# Patient Record
Sex: Female | Born: 1955 | Race: White | Hispanic: No | Marital: Married | Smoking: Former smoker
Health system: Southern US, Community
[De-identification: ages and names within clinical notes are randomized; demographics above are authoritative.]

## PROBLEM LIST (undated history)

## (undated) DIAGNOSIS — E785 Hyperlipidemia, unspecified: Secondary | ICD-10-CM

## (undated) DIAGNOSIS — T753XXA Motion sickness, initial encounter: Secondary | ICD-10-CM

## (undated) DIAGNOSIS — Z8489 Family history of other specified conditions: Secondary | ICD-10-CM

## (undated) DIAGNOSIS — Z8619 Personal history of other infectious and parasitic diseases: Secondary | ICD-10-CM

## (undated) DIAGNOSIS — Z9189 Other specified personal risk factors, not elsewhere classified: Secondary | ICD-10-CM

## (undated) DIAGNOSIS — R42 Dizziness and giddiness: Secondary | ICD-10-CM

## (undated) DIAGNOSIS — I219 Acute myocardial infarction, unspecified: Secondary | ICD-10-CM

## (undated) DIAGNOSIS — I10 Essential (primary) hypertension: Secondary | ICD-10-CM

## (undated) HISTORY — PX: COLONOSCOPY: SHX174

## (undated) HISTORY — DX: Hyperlipidemia, unspecified: E78.5

## (undated) HISTORY — DX: Other specified personal risk factors, not elsewhere classified: Z91.89

## (undated) HISTORY — DX: Personal history of other infectious and parasitic diseases: Z86.19

---

## 2005-06-02 ENCOUNTER — Ambulatory Visit: Payer: Self-pay | Admitting: Internal Medicine

## 2005-06-14 ENCOUNTER — Ambulatory Visit: Payer: Self-pay | Admitting: Gastroenterology

## 2006-06-23 ENCOUNTER — Ambulatory Visit: Payer: Self-pay | Admitting: Internal Medicine

## 2007-07-12 ENCOUNTER — Ambulatory Visit: Payer: Self-pay | Admitting: Internal Medicine

## 2008-08-27 ENCOUNTER — Ambulatory Visit: Payer: Self-pay | Admitting: Internal Medicine

## 2009-09-02 ENCOUNTER — Ambulatory Visit: Payer: Self-pay | Admitting: Internal Medicine

## 2011-05-20 ENCOUNTER — Ambulatory Visit: Payer: Self-pay | Admitting: Internal Medicine

## 2013-10-19 ENCOUNTER — Ambulatory Visit: Payer: Self-pay | Admitting: Internal Medicine

## 2013-11-14 ENCOUNTER — Encounter (INDEPENDENT_AMBULATORY_CARE_PROVIDER_SITE_OTHER): Payer: Self-pay

## 2013-11-14 ENCOUNTER — Encounter: Payer: Self-pay | Admitting: Internal Medicine

## 2013-11-14 ENCOUNTER — Ambulatory Visit (INDEPENDENT_AMBULATORY_CARE_PROVIDER_SITE_OTHER): Payer: PRIVATE HEALTH INSURANCE | Admitting: Internal Medicine

## 2013-11-14 VITALS — BP 130/80 | HR 72 | Temp 98.1°F | Ht 61.0 in | Wt 157.2 lb

## 2013-11-14 DIAGNOSIS — K625 Hemorrhage of anus and rectum: Secondary | ICD-10-CM

## 2013-11-14 DIAGNOSIS — E7800 Pure hypercholesterolemia, unspecified: Secondary | ICD-10-CM

## 2013-11-14 DIAGNOSIS — R030 Elevated blood-pressure reading, without diagnosis of hypertension: Secondary | ICD-10-CM

## 2013-11-14 DIAGNOSIS — IMO0001 Reserved for inherently not codable concepts without codable children: Secondary | ICD-10-CM

## 2013-11-14 DIAGNOSIS — Z8371 Family history of colonic polyps: Secondary | ICD-10-CM

## 2013-11-14 NOTE — Progress Notes (Signed)
Pre-visit discussion using our clinic review tool. No additional management support is needed unless otherwise documented below in the visit note.  

## 2013-11-17 ENCOUNTER — Encounter: Payer: Self-pay | Admitting: Internal Medicine

## 2013-11-17 NOTE — Progress Notes (Signed)
   Subjective:    Patient ID: Leah Espinoza, female    DOB: 21-Jan-1956, 58 y.o.   MRN: [id number]  HPI 58 year old female with past history of elevated blood pressure and hypercholesterolemia who comes in today to follow up on these issues as well as to establish care.  She was a pt of mine at [facility].  She states she has been doing relatively well.  No chest pain or tightness.  No sob.  Minimal acid reflux.  She does report noticing some change in her stool.  Stool is not as firm.  States that 3- 4 weeks ago, she noticed some BRB on the tissue.  Only noticed on one occasion.  She is overdue a colonoscopy.  States blood pressure - ok.     Past Medical History  Diagnosis Date  . History of chicken pox   . History of fainting   . Hyperlipidemia     Review of Systems Patient denies any headache, lightheadedness or dizziness.  No sinus or allergy symptoms.  No chest pain, tightness or palpitations.  No increased shortness of breath, cough or congestion.  Minimal acid reflux.  No nausea or vomiting.  No abdominal pain or cramping.  Stool not as firm.  BRB as outlined.  No urine change.  Overall feels she is doing well.       Objective:   Physical Exam Filed Vitals:   11/14/13 1044  BP: 130/80  Pulse: 72  Temp: 98.1 F (26.74 C)   58 year old female in no acute distress.   HEENT:  Nares- clear.  Oropharynx - without lesions. NECK:  Supple.  Nontender.  No audible bruit.  HEART:  Appears to be regular. LUNGS:  No crackles or wheezing audible.  Respirations even and unlabored.  RADIAL PULSE:  Equal bilaterally. ABDOMEN:  Soft, nontender.  Bowel sounds present and normal.  No audible abdominal bruit.   EXTREMITIES:  No increased edema present.  DP pulses palpable and equal bilaterally.          Assessment & Plan:  HEALTH MAINTENANCE.  Schedule her for a physical next visit.  Schedule mammogram.  Refer to GI for colonoscopy.    I spent 25 minutes with the patient and more than 50% of the  time was spent in consultation regarding the above.

## 2013-11-18 DIAGNOSIS — E7800 Pure hypercholesterolemia, unspecified: Secondary | ICD-10-CM | POA: Insufficient documentation

## 2013-11-18 DIAGNOSIS — Z8371 Family history of colonic polyps: Secondary | ICD-10-CM | POA: Insufficient documentation

## 2013-11-18 DIAGNOSIS — R030 Elevated blood-pressure reading, without diagnosis of hypertension: Secondary | ICD-10-CM | POA: Insufficient documentation

## 2013-11-18 DIAGNOSIS — K625 Hemorrhage of anus and rectum: Secondary | ICD-10-CM | POA: Insufficient documentation

## 2013-11-18 NOTE — Assessment & Plan Note (Signed)
Blood pressure ok today.  Follow.  Check metabolic panel.

## 2013-11-18 NOTE — Assessment & Plan Note (Signed)
Overdue colonoscopy.  With the BRB and stool change, refer to GI.

## 2013-11-18 NOTE — Assessment & Plan Note (Signed)
Had the episode of BRB as outlined.  Stool change as outlined.  Refer to GI for evaluation and is overdue colonoscopy.

## 2013-11-18 NOTE — Assessment & Plan Note (Signed)
On no medication.  Low cholesterol diet.  Will follow.

## 2013-11-30 ENCOUNTER — Encounter: Payer: Self-pay | Admitting: Internal Medicine

## 2013-11-30 ENCOUNTER — Telehealth: Payer: Self-pay | Admitting: Internal Medicine

## 2013-11-30 DIAGNOSIS — Z1239 Encounter for other screening for malignant neoplasm of breast: Secondary | ICD-10-CM

## 2013-11-30 NOTE — Telephone Encounter (Signed)
Does she need one faxed over or just for me to place the order.  Thanks.

## 2013-11-30 NOTE — Telephone Encounter (Signed)
The patient is needing a mammogram order.

## 2013-12-03 NOTE — Telephone Encounter (Signed)
Order placed for mammogram.

## 2013-12-03 NOTE — Telephone Encounter (Signed)
Unable to reach patient on home number, can not leave a message on voicemail. You can place the referral order & if patients needs something different, I'll let you know if she calls back.

## 2013-12-19 ENCOUNTER — Encounter: Payer: Self-pay | Admitting: Internal Medicine

## 2014-01-02 ENCOUNTER — Ambulatory Visit: Payer: Self-pay | Admitting: Gastroenterology

## 2014-01-02 LAB — HM COLONOSCOPY: HM Colonoscopy: NORMAL

## 2014-01-11 ENCOUNTER — Encounter: Payer: Self-pay | Admitting: Internal Medicine

## 2014-01-11 DIAGNOSIS — K625 Hemorrhage of anus and rectum: Secondary | ICD-10-CM

## 2014-01-15 ENCOUNTER — Ambulatory Visit: Payer: Self-pay | Admitting: Internal Medicine

## 2014-01-15 ENCOUNTER — Encounter: Payer: Self-pay | Admitting: Internal Medicine

## 2014-01-15 LAB — HM MAMMOGRAPHY: HM Mammogram: NEGATIVE

## 2014-02-01 ENCOUNTER — Encounter (INDEPENDENT_AMBULATORY_CARE_PROVIDER_SITE_OTHER): Payer: Self-pay

## 2014-02-01 ENCOUNTER — Encounter: Payer: Self-pay | Admitting: Internal Medicine

## 2014-02-01 ENCOUNTER — Other Ambulatory Visit (HOSPITAL_COMMUNITY)
Admission: RE | Admit: 2014-02-01 | Discharge: 2014-02-01 | Disposition: A | Discharge status: Home / Self Care | Payer: Managed Care, Other (non HMO) | Source: Ambulatory Visit | Attending: Internal Medicine | Admitting: Internal Medicine

## 2014-02-01 ENCOUNTER — Ambulatory Visit (INDEPENDENT_AMBULATORY_CARE_PROVIDER_SITE_OTHER): Payer: Managed Care, Other (non HMO) | Admitting: Internal Medicine

## 2014-02-01 VITALS — BP 130/80 | HR 59 | Temp 98.3°F | Ht 60.5 in | Wt 157.0 lb

## 2014-02-01 DIAGNOSIS — Z83719 Family history of colon polyps, unspecified: Secondary | ICD-10-CM

## 2014-02-01 DIAGNOSIS — Z124 Encounter for screening for malignant neoplasm of cervix: Secondary | ICD-10-CM

## 2014-02-01 DIAGNOSIS — Z8371 Family history of colonic polyps: Secondary | ICD-10-CM

## 2014-02-01 DIAGNOSIS — Z1151 Encounter for screening for human papillomavirus (HPV): Secondary | ICD-10-CM | POA: Insufficient documentation

## 2014-02-01 DIAGNOSIS — E7800 Pure hypercholesterolemia, unspecified: Secondary | ICD-10-CM

## 2014-02-01 DIAGNOSIS — R030 Elevated blood-pressure reading, without diagnosis of hypertension: Secondary | ICD-10-CM

## 2014-02-01 DIAGNOSIS — IMO0001 Reserved for inherently not codable concepts without codable children: Secondary | ICD-10-CM

## 2014-02-01 DIAGNOSIS — K625 Hemorrhage of anus and rectum: Secondary | ICD-10-CM

## 2014-02-01 MED ORDER — PRAVASTATIN SODIUM 20 MG PO TABS: ORAL_TABLET | ORAL | Status: DC

## 2014-02-01 NOTE — Progress Notes (Signed)
Pre visit review using our clinic review tool, if applicable. No additional management support is needed unless otherwise documented below in the visit note. 

## 2014-02-04 ENCOUNTER — Encounter: Payer: Self-pay | Admitting: Internal Medicine

## 2014-02-04 NOTE — Assessment & Plan Note (Signed)
On no medication.  Low cholesterol diet.  Discussed at length with her regarding her cholesterol levels and treatment options.  She wants to retry pravastatin.  Will start 20mg  q day.  Diet and exercise.  Will follow.

## 2014-02-04 NOTE — Assessment & Plan Note (Signed)
Colonoscopy 4/15 - normal.

## 2014-02-04 NOTE — Assessment & Plan Note (Signed)
Colonoscopy normal as outlined.  No further bleeding.  Follow.

## 2014-02-04 NOTE — Assessment & Plan Note (Signed)
Blood pressure ok today.  Follow.  Check metabolic panel.

## 2014-02-04 NOTE — Progress Notes (Signed)
   Subjective:    Patient ID: Leah Espinoza, female    DOB: February 18, 1956, 58 y.o.   MRN: [id number]  HPI 58 year old female with past history of elevated blood pressure and hypercholesterolemia who comes in today to follow up on these issues as well as for a complete physical exam.  She states she has been doing relatively well.  No chest pain or tightness.  No sob.  Stays active.  No acid reflux reported.  No nausea or vomiting.  No bowel change.  Previously had noticed some BRB.  See last note for details.  Referred to GI.  Had colonoscopy.  Clear.  Recommended f/u in 10 years.     Past Medical History  Diagnosis Date  . History of chicken pox   . History of fainting   . Hyperlipidemia     Review of Systems Patient denies any headache, lightheadedness or dizziness.  No sinus or allergy symptoms.  No chest pain, tightness or palpitations.  No increased shortness of breath, cough or congestion.  No acid reflux reported.  No nausea or vomiting.  No abdominal pain or cramping.  No urine change.  Overall feels she is doing well.  Recent labs with increased cholesterol.  Discussed diet and exercise.       Objective:   Physical Exam  Filed Vitals:   02/01/14 0948  BP: 130/80  Pulse: 59  Temp: 98.3 F (36.8 C)   Blood pressure recheck:  9/33-30  58 year old female in no acute distress.   HEENT:  Nares- clear.  Oropharynx - without lesions. NECK:  Supple.  Nontender.  No audible bruit.  HEART:  Appears to be regular. LUNGS:  No crackles or wheezing audible.  Respirations even and unlabored.  RADIAL PULSE:  Equal bilaterally.    BREASTS:  No nipple discharge or nipple retraction present.  Could not appreciate any distinct nodules or axillary adenopathy.  ABDOMEN:  Soft, nontender.  Bowel sounds present and normal.  No audible abdominal bruit.  GU:  Normal external genitalia.  Vaginal vault without lesions.  Cervix identified.  Pap performed. Could not appreciate any adnexal masses or  tenderness.   RECTAL:  Not performed.     EXTREMITIES:  No increased edema present.  DP pulses palpable and equal bilaterally.          Assessment & Plan:  HEALTH MAINTENANCE.  Physical today.  Mammogram 01/15/14 - Birads I.   Just saw GI.  Had colonoscopy.  Negative.  Per pt, recommended f/u colonoscopy in 10 years.      I spent 25 minutes with the patient and more than 50% of the time was spent in consultation regarding the above.

## 2014-02-08 ENCOUNTER — Encounter: Payer: Self-pay | Admitting: *Deleted

## 2014-02-12 ENCOUNTER — Encounter: Payer: PRIVATE HEALTH INSURANCE | Admitting: Internal Medicine

## 2014-02-13 ENCOUNTER — Encounter: Payer: Self-pay | Admitting: Internal Medicine

## 2014-08-05 ENCOUNTER — Encounter: Payer: Self-pay | Admitting: Internal Medicine

## 2014-08-05 ENCOUNTER — Ambulatory Visit (INDEPENDENT_AMBULATORY_CARE_PROVIDER_SITE_OTHER): Payer: Managed Care, Other (non HMO) | Admitting: Internal Medicine

## 2014-08-05 VITALS — BP 164/95 | HR 73 | Temp 98.4°F | Ht 60.5 in | Wt 158.0 lb

## 2014-08-05 DIAGNOSIS — Z8371 Family history of colonic polyps: Secondary | ICD-10-CM

## 2014-08-05 DIAGNOSIS — IMO0001 Reserved for inherently not codable concepts without codable children: Secondary | ICD-10-CM

## 2014-08-05 DIAGNOSIS — Z658 Other specified problems related to psychosocial circumstances: Secondary | ICD-10-CM

## 2014-08-05 DIAGNOSIS — E7800 Pure hypercholesterolemia, unspecified: Secondary | ICD-10-CM

## 2014-08-05 DIAGNOSIS — F439 Reaction to severe stress, unspecified: Secondary | ICD-10-CM | POA: Insufficient documentation

## 2014-08-05 DIAGNOSIS — R030 Elevated blood-pressure reading, without diagnosis of hypertension: Secondary | ICD-10-CM

## 2014-08-05 DIAGNOSIS — E669 Obesity, unspecified: Secondary | ICD-10-CM

## 2014-08-05 DIAGNOSIS — Z83719 Family history of colon polyps, unspecified: Secondary | ICD-10-CM

## 2014-08-05 NOTE — Progress Notes (Signed)
Subjective:    Patient ID: Leah Espinoza, female    DOB: [birth year], 57 y.o.   MRN: [id number]  HPI 58 year old female with past history of elevated blood pressure and hypercholesterolemia who comes in today for a scheduled follow up.  She reports increased stress.  Sister just had a heart attack last night.  She was up late.  Feels this is why her blood pressure is elevated.  Crying intermittently through the exam. She states she has been doing relatively well otherwise.  States normally her blood pressure is averaging - 409-811 systolic.  No chest pain or tightness.  No sob.  Tries to stay active.  Some fatigue.  No acid reflux reported.  No nausea or vomiting.  No bowel change.  Previously had noticed some BRB.  See previous note for details.  Referred to GI.  Had colonoscopy.  Clear.  Recommended f/u in 10 years.  No further bleeding.  Bowels stable.  Not taking her cholesterol medication regularly.    Past Medical History  Diagnosis Date  . History of chicken pox   . History of fainting   . Hyperlipidemia     Current Outpatient Prescriptions on File Prior to Visit  Medication Sig Dispense Refill  . pravastatin (PRAVACHOL) 20 MG tablet Pt wants delivered to [facility] 30 tablet 3   No current facility-administered medications on file prior to visit.    Review of Systems Patient denies any headache, lightheadedness or dizziness.  No sinus or allergy symptoms.  No chest pain, tightness or palpitations.  No increased shortness of breath, cough or congestion.  No acid reflux reported.  No nausea or vomiting.  No abdominal pain or cramping.  No urine change.  Overall feels she is doing well.  Increased stress as outlined.  Elevated blood pressure today.  Has not been taking her cholesterol medication regularly.  Did not get her labs drawn.       Objective:   Physical Exam  Filed Vitals:   08/05/14 0805  BP: 164/95  Pulse: 73  Temp: 98.4 F (36.9 C)   Blood pressure  recheck:  152-154/92-94, recheck prior to leaving 84/50-88  59 year old female in no acute distress.   HEENT:  Nares- clear.  Oropharynx - without lesions. NECK:  Supple.  Nontender.  No audible bruit.  HEART:  Appears to be regular. LUNGS:  No crackles or wheezing audible.  Respirations even and unlabored.  RADIAL PULSE:  Equal bilaterally.  ABDOMEN:  Soft, nontender.  Bowel sounds present and normal.  No audible abdominal bruit.     EXTREMITIES:  No increased edema present.  DP pulses palpable and equal bilaterally.          Assessment & Plan:  1. Hypercholesterolemia Cholesterol elevated.  She has been resistant to taking cholesterol medication.  She has pravastatin.  Not taking regularly.  Is now planning to start taking.  Follow lipid and liver panel.    2. Family history of colonic polyps Colonoscopy 01/02/14 - normal.  Recommended f/u colonoscopy in 10 years.    3. Elevated blood pressure Elevated today.  Sister had heart attack last night.  Upset.  States usually not this elevated.  Will spot check her pressure.  Get her back in soon to reassess.  Check met b.    4. Stress Increased stress with her sister's health as outlined.  Feels she is doing ok.  Will notify me if needs something more.  5. Obesity (BMI 30-39.9) Discussed diet and exercise.   HEALTH MAINTENANCE.  Physical 02/04/14.  Mammogram 01/15/14 - Birads I.   Colonoscopy 01/02/14 - normal.  Per pt, recommended f/u colonoscopy in 10 years.      I spent 25 minutes with the patient and more than 50% of the time was spent in consultation regarding the above.

## 2014-08-05 NOTE — Progress Notes (Signed)
Pre visit review using our clinic review tool, if applicable. No additional management support is needed unless otherwise documented below in the visit note. 

## 2014-10-15 ENCOUNTER — Ambulatory Visit: Payer: Managed Care, Other (non HMO) | Admitting: Internal Medicine

## 2014-11-14 ENCOUNTER — Telehealth: Payer: Self-pay | Admitting: Internal Medicine

## 2014-11-14 NOTE — Telephone Encounter (Signed)
[facility] Patient Name: Eylin [facility] DOB: 1956/04/06 Initial Comment Caller States she has been having cough, congestion, very weak. for 4 days she could not get off the couch. not sure if she is running a temp at the moment. Nurse Assessment Nurse: Markus Daft, RN, Sherre Poot Date/Time (Eastern Time): 11/14/2014 9:59:41 AM Confirm and document reason for call. If symptomatic, describe symptoms. ---Caller states she has been having productive cough, chest congestion, very weak for 4 days. She could not get off the couch d/t fatigue and weakness. No fever. Has the patient traveled out of the country within the last 30 days? ---Not Applicable Does the patient require triage? ---Yes Related visit to physician within the last 2 weeks? ---No Does the PT have any chronic conditions? (i.e. diabetes, asthma, etc.) ---Yes List chronic conditions. ---High cholesterol Guidelines Guideline Title Affirmed Question Affirmed Notes Cough - Acute Productive Fever present > 3 days (72 hours) chest pressure with coughing only, rates 4/10 Final Disposition User See Physician within [place], [place], [place] Comments No available appts (checked [place] and this office), and please check with doctor if she can be worked in today. Please call within 1-2 hours if able to do this, otherwise, caller will go on to be seen at [facility].

## 2014-11-14 NOTE — Telephone Encounter (Signed)
Since I am already working someone in through lunch and have a meeting right after work, I am unable to work in today.   If no openings here, agree with acute care (especially given the weakness, etc).  Can do labs if needed.  Would recommend [facility] or [facility] [facility].

## 2014-11-14 NOTE — Telephone Encounter (Signed)
Spoke with pt advised of MDs message.  She states she will go to [facility].

## 2014-11-27 LAB — TSH: TSH: 1.07 u[IU]/mL (ref 0.41–5.90)

## 2014-11-27 LAB — LIPID PANEL
CHOLESTEROL: 244 mg/dL — AB (ref 0–200)
HDL: 47 mg/dL (ref 35–70)
LDL CALC: 172 mg/dL
Triglycerides: 127 mg/dL (ref 40–160)

## 2014-11-27 LAB — HEPATIC FUNCTION PANEL
ALK PHOS: 82 U/L (ref 25–125)
ALT: 9 U/L (ref 7–35)
AST: 12 U/L — AB (ref 13–35)
BILIRUBIN, TOTAL: 0.3 mg/dL
Bilirubin, Direct: 0.09 mg/dL (ref 0.01–0.4)

## 2014-11-27 LAB — CBC AND DIFFERENTIAL
HCT: 37 % (ref 36–46)
Hemoglobin: 12.7 g/dL (ref 12.0–16.0)
NEUTROS ABS: 4 /uL
Platelets: 355 10*3/uL (ref 150–399)
WBC: 6.8 10^3/mL

## 2014-11-27 LAB — BASIC METABOLIC PANEL
BUN: 9 mg/dL (ref 4–21)
Creatinine: 0.8 mg/dL (ref 0.5–1.1)
GLUCOSE: 107 mg/dL
Potassium: 4.4 mmol/L (ref 3.4–5.3)
Sodium: 143 mmol/L (ref 137–147)

## 2014-11-28 ENCOUNTER — Encounter: Payer: Self-pay | Admitting: *Deleted

## 2014-12-04 ENCOUNTER — Encounter: Payer: Self-pay | Admitting: Internal Medicine

## 2014-12-04 ENCOUNTER — Ambulatory Visit (INDEPENDENT_AMBULATORY_CARE_PROVIDER_SITE_OTHER): Payer: Managed Care, Other (non HMO) | Admitting: Internal Medicine

## 2014-12-04 VITALS — BP 137/85 | HR 75 | Temp 98.3°F | Ht 60.5 in | Wt 152.0 lb

## 2014-12-04 DIAGNOSIS — R030 Elevated blood-pressure reading, without diagnosis of hypertension: Secondary | ICD-10-CM | POA: Diagnosis not present

## 2014-12-04 DIAGNOSIS — E7800 Pure hypercholesterolemia, unspecified: Secondary | ICD-10-CM

## 2014-12-04 DIAGNOSIS — Z0000 Encounter for general adult medical examination without abnormal findings: Secondary | ICD-10-CM

## 2014-12-04 DIAGNOSIS — Z8371 Family history of colonic polyps: Secondary | ICD-10-CM | POA: Diagnosis not present

## 2014-12-04 DIAGNOSIS — IMO0001 Reserved for inherently not codable concepts without codable children: Secondary | ICD-10-CM

## 2014-12-04 MED ORDER — PRAVASTATIN SODIUM 20 MG PO TABS: ORAL_TABLET | ORAL | Status: DC

## 2014-12-04 NOTE — Progress Notes (Signed)
Pre visit review using our clinic review tool, if applicable. No additional management support is needed unless otherwise documented below in the visit note. 

## 2014-12-09 ENCOUNTER — Encounter: Payer: Self-pay | Admitting: Internal Medicine

## 2014-12-09 DIAGNOSIS — Z0000 Encounter for general adult medical examination without abnormal findings: Secondary | ICD-10-CM | POA: Insufficient documentation

## 2014-12-09 NOTE — Assessment & Plan Note (Signed)
Had colonoscopy 01/02/14.  Recommended f/u colonoscopy in 10 years.

## 2014-12-09 NOTE — Progress Notes (Signed)
Patient ID: Leah Espinoza, female   DOB: Feb 08, 1956, 59 y.o.   MRN: [id number]   Subjective:    Patient ID: Leah Espinoza, female    DOB: 1956-05-31, 59 y.o.   MRN: [id number]  HPI  Patient here for a scheduled follow up.  Has been recording her blood pressures.   Most recent checks averaging 130s/80s.  Taking her pravastatin, but not taking it on a regular basis.  No chest pain or tightness.  No cardiac symptoms with increased activity or exertion.  Breathing stable.  Bowels stable.     Past Medical History  Diagnosis Date  . History of chicken pox   . History of fainting   . Hyperlipidemia     Outpatient Encounter Prescriptions as of 12/04/2014  Medication Sig  . pravastatin (PRAVACHOL) 20 MG tablet Pt wants delivered to [facility]  . [DISCONTINUED] pravastatin (PRAVACHOL) 20 MG tablet Pt wants delivered to [facility]    Review of Systems  Constitutional: Negative for appetite change and unexpected weight change.  HENT: Negative for congestion and sinus pressure.   Respiratory: Negative for cough, chest tightness and shortness of breath.   Cardiovascular: Negative for chest pain, palpitations and leg swelling.  Gastrointestinal: Negative for nausea, vomiting, abdominal pain and diarrhea.  Neurological: Negative for dizziness, light-headedness and headaches.       Objective:    Physical Exam  Constitutional: She appears well-developed and well-nourished. No distress.  HENT:  Nose: Nose normal.  Mouth/Throat: Oropharynx is clear and moist.  Neck: Neck supple. No thyromegaly present.  Cardiovascular: Normal rate and regular rhythm.   Pulmonary/Chest: Breath sounds normal. No respiratory distress. She has no wheezes.  Abdominal: Soft. Bowel sounds are normal. There is no tenderness.  Musculoskeletal: She exhibits no edema or tenderness.  Lymphadenopathy:    She has no cervical adenopathy.  Skin: No rash noted. No erythema.    BP 137/85 mmHg  Pulse 75   Temp(Src) 98.3 F (36.8 C) (Oral)  Ht 5' 0.5" (1.537 m)  Wt 152 lb (68.947 kg)  BMI 29.19 kg/m2  SpO2 97% Wt Readings from Last 3 Encounters:  12/04/14 152 lb (68.947 kg)  08/05/14 158 lb (71.668 kg)  02/01/14 157 lb (71.215 kg)     Lab Results  Component Value Date   WBC 6.8 11/27/2014   HGB 12.7 11/27/2014   HCT 37 11/27/2014   PLT 355 11/27/2014   CHOL 244* 11/27/2014   TRIG 127 11/27/2014   HDL 47 11/27/2014   LDLCALC 172 11/27/2014   ALT 9 11/27/2014   AST 12* 11/27/2014   NA 143 11/27/2014   K 4.4 11/27/2014   CREATININE 0.8 11/27/2014   BUN 9 11/27/2014   TSH 1.07 11/27/2014       Assessment & Plan:   Problem List Items Addressed This Visit    Elevated blood pressure - Primary    Blood pressures on her outside checks recently averaging 130s/80s.  She will continue to follow her pressures.. Notify me if elevation.  Follow metabolic panel.       Family history of colonic polyps    Had colonoscopy 01/02/14.  Recommended f/u colonoscopy in 10 years.        Health care maintenance    Physical 02/04/14.  Mammogram 01/15/14 - Birads I.  Colonoscopy 01/02/14 - recommend f/u colonoscopy in 10 years.        Hypercholesterolemia    On pravastatin 20mg  q day.  States she has not  been taking it regularly.  LDL just checked had decreased 172. She wants to hold on increasing the dose.  Wants to work on diet and exercise.  Follow lipid panel and liver function tests.        Relevant Medications   pravastatin (PRAVACHOL) tablet       Einar Pheasant, MD

## 2014-12-09 NOTE — Assessment & Plan Note (Signed)
On pravastatin 20mg  q day.  States she has not been taking it regularly.  LDL just checked had decreased 172. She wants to hold on increasing the dose.  Wants to work on diet and exercise.  Follow lipid panel and liver function tests.

## 2014-12-09 NOTE — Assessment & Plan Note (Signed)
Physical 02/04/14.  Mammogram 01/15/14 - Birads I.  Colonoscopy 01/02/14 - recommend f/u colonoscopy in 10 years.

## 2014-12-09 NOTE — Assessment & Plan Note (Signed)
Blood pressures on her outside checks recently averaging 130s/80s.  She will continue to follow her pressures.. Notify me if elevation.  Follow metabolic panel.

## 2015-04-02 ENCOUNTER — Other Ambulatory Visit: Payer: Self-pay | Admitting: Internal Medicine

## 2015-04-02 DIAGNOSIS — Z1231 Encounter for screening mammogram for malignant neoplasm of breast: Secondary | ICD-10-CM

## 2015-04-03 ENCOUNTER — Ambulatory Visit
Admission: RE | Admit: 2015-04-03 | Discharge: 2015-04-03 | Disposition: A | Discharge status: Home / Self Care | Payer: Managed Care, Other (non HMO) | Source: Ambulatory Visit | Attending: Internal Medicine | Admitting: Internal Medicine

## 2015-04-03 DIAGNOSIS — Z1231 Encounter for screening mammogram for malignant neoplasm of breast: Secondary | ICD-10-CM | POA: Insufficient documentation

## 2015-04-04 ENCOUNTER — Encounter: Payer: Self-pay | Admitting: Internal Medicine

## 2015-04-04 LAB — HEPATIC FUNCTION PANEL
ALK PHOS: 91 U/L (ref 25–125)
ALT: 15 U/L (ref 7–35)
AST: 16 U/L (ref 13–35)
BILIRUBIN DIRECT: 0.09 mg/dL (ref 0.01–0.4)
BILIRUBIN, TOTAL: 0.3 mg/dL

## 2015-04-04 LAB — BASIC METABOLIC PANEL
BUN: 12 mg/dL (ref 4–21)
Creatinine: 1 mg/dL (ref 0.5–1.1)
Glucose: 97 mg/dL
Potassium: 4.3 mmol/L (ref 3.4–5.3)
SODIUM: 144 mmol/L (ref 137–147)

## 2015-04-04 LAB — LIPID PANEL
Cholesterol: 288 mg/dL — AB (ref 0–200)
HDL: 60 mg/dL (ref 35–70)
LDL CALC: 208 mg/dL
TRIGLYCERIDES: 101 mg/dL (ref 40–160)

## 2015-04-08 ENCOUNTER — Ambulatory Visit (INDEPENDENT_AMBULATORY_CARE_PROVIDER_SITE_OTHER): Payer: Managed Care, Other (non HMO) | Admitting: Internal Medicine

## 2015-04-08 ENCOUNTER — Encounter: Payer: Self-pay | Admitting: Internal Medicine

## 2015-04-08 VITALS — BP 134/86 | HR 64 | Temp 98.7°F | Ht 60.5 in | Wt 152.0 lb

## 2015-04-08 DIAGNOSIS — Z8371 Family history of colonic polyps: Secondary | ICD-10-CM

## 2015-04-08 DIAGNOSIS — Z0000 Encounter for general adult medical examination without abnormal findings: Secondary | ICD-10-CM

## 2015-04-08 DIAGNOSIS — E7800 Pure hypercholesterolemia, unspecified: Secondary | ICD-10-CM

## 2015-04-08 DIAGNOSIS — IMO0001 Reserved for inherently not codable concepts without codable children: Secondary | ICD-10-CM

## 2015-04-08 DIAGNOSIS — E669 Obesity, unspecified: Secondary | ICD-10-CM

## 2015-04-08 DIAGNOSIS — R030 Elevated blood-pressure reading, without diagnosis of hypertension: Secondary | ICD-10-CM

## 2015-04-08 DIAGNOSIS — Z658 Other specified problems related to psychosocial circumstances: Secondary | ICD-10-CM

## 2015-04-08 DIAGNOSIS — F439 Reaction to severe stress, unspecified: Secondary | ICD-10-CM

## 2015-04-08 NOTE — Progress Notes (Signed)
Pre visit review using our clinic review tool, if applicable. No additional management support is needed unless otherwise documented below in the visit note. 

## 2015-04-08 NOTE — Assessment & Plan Note (Signed)
Physical today 04/08/15.  Mammogram 04/03/15 - Birads I.  Colonoscopy 01/02/14.  Recommended f/u colonoscopy in 10 years.

## 2015-04-08 NOTE — Assessment & Plan Note (Signed)
On recheck improved - as outlined.  Follow pressures.  Have her spot check her pressure.  Recent Cr wnl.

## 2015-04-08 NOTE — Assessment & Plan Note (Signed)
Recent cholesterol elevated.  Discussed diet and exercise.  She is not taking her medication regularly.  Discussed changing to crestor - given level.  She declines.  Wants to continue 20mg  of pravastatin and take regularly.  Recheck cholesterol and liver function tests in 6 weeks.

## 2015-04-08 NOTE — Assessment & Plan Note (Signed)
Diet and exercise.   

## 2015-04-08 NOTE — Progress Notes (Signed)
Patient ID: Leah Espinoza, female   DOB: 1956/07/24, 59 y.o.   MRN: [id number]   Subjective:    Patient ID: Leah Espinoza, female    DOB: 06/03/1956, 59 y.o.   MRN: [id number]  HPI  Patient here to follow up on these medical issues as well as for a complete physical exam.  Occasional IBS flare.  She desires no further intervention.  Does not feel she needs anything more.  Just had colonoscopy.  Tries to stay active.  No cardiac symptoms with increased activity or exertion.  No sob.  Not taking her cholesterol medication regularly.  Skips a lot of doses.  LDL elevated - 208.     Past Medical History  Diagnosis Date  . History of chicken pox   . History of fainting   . Hyperlipidemia     Outpatient Encounter Prescriptions as of 04/08/2015  Medication Sig  . pravastatin (PRAVACHOL) 20 MG tablet Pt wants delivered to [facility]   No facility-administered encounter medications on file as of 04/08/2015.    Review of Systems  Constitutional: Negative for appetite change and unexpected weight change.  HENT: Negative for congestion and sinus pressure.   Eyes: Negative for pain and visual disturbance.  Respiratory: Negative for cough, chest tightness and shortness of breath.   Cardiovascular: Negative for chest pain, palpitations and leg swelling.  Gastrointestinal: Negative for nausea, vomiting, abdominal pain and diarrhea.  Genitourinary: Negative for dysuria and difficulty urinating.  Musculoskeletal: Negative for back pain and joint swelling.  Skin: Negative for color change and rash.  Neurological: Negative for dizziness, light-headedness and headaches.  Hematological: Negative for adenopathy. Does not bruise/bleed easily.  Psychiatric/Behavioral: Negative for dysphoric mood and agitation.       Objective:     Blood pressure recheck:  134-136/84-86  Physical Exam  Constitutional: She is oriented to person, place, and time. She appears well-developed and well-nourished.    HENT:  Nose: Nose normal.  Mouth/Throat: Oropharynx is clear and moist.  Eyes: Right eye exhibits no discharge. Left eye exhibits no discharge. No scleral icterus.  Neck: Neck supple. No thyromegaly present.  Cardiovascular: Normal rate and regular rhythm.   Pulmonary/Chest: Breath sounds normal. No accessory muscle usage. No tachypnea. No respiratory distress. She has no decreased breath sounds. She has no wheezes. She has no rhonchi. Right breast exhibits no inverted nipple, no mass, no nipple discharge and no tenderness (no axillary adenopathy). Left breast exhibits no inverted nipple, no mass, no nipple discharge and no tenderness (no axilarry adenopathy).  Abdominal: Soft. Bowel sounds are normal. There is no tenderness.  Musculoskeletal: She exhibits no edema or tenderness.  Lymphadenopathy:    She has no cervical adenopathy.  Neurological: She is alert and oriented to person, place, and time.  Skin: Skin is warm. No rash noted.  Psychiatric: She has a normal mood and affect. Her behavior is normal.    BP 134/86 mmHg  Pulse 64  Temp(Src) 98.7 F (37.1 C) (Oral)  Ht 5' 0.5" (1.537 m)  Wt 152 lb (68.947 kg)  BMI 29.19 kg/m2  SpO2 95% Wt Readings from Last 3 Encounters:  04/08/15 152 lb (68.947 kg)  12/04/14 152 lb (68.947 kg)  08/05/14 158 lb (71.668 kg)     Lab Results  Component Value Date   WBC 6.8 11/27/2014   HGB 12.7 11/27/2014   HCT 37 11/27/2014   PLT 355 11/27/2014   CHOL 288* 04/04/2015   TRIG 101 04/04/2015   HDL  60 04/04/2015   LDLCALC 208 04/04/2015   ALT 15 04/04/2015   AST 16 04/04/2015   NA 144 04/04/2015   K 4.3 04/04/2015   CREATININE 1.0 04/04/2015   BUN 12 04/04/2015   TSH 1.07 11/27/2014    Mm Digital Screening Bilateral  04/03/2015   CLINICAL DATA:  Screening.  EXAM: DIGITAL SCREENING BILATERAL MAMMOGRAM WITH CAD  COMPARISON:  Previous exam(s).  ACR Breast Density Category b: There are scattered areas of fibroglandular density.  FINDINGS:  There are no findings suspicious for malignancy. Images were processed with CAD.  IMPRESSION: No mammographic evidence of malignancy. A result letter of this screening mammogram will be mailed directly to the patient.  RECOMMENDATION: Screening mammogram in one year. (Code:SM-B-01Y)  BI-RADS CATEGORY  1: Negative.   Electronically Signed   By: Abelardo Diesel M.D.   On: 04/03/2015 14:26       Assessment & Plan:   Problem List Items Addressed This Visit    Elevated blood pressure    On recheck improved - as outlined.  Follow pressures.  Have her spot check her pressure.  Recent Cr wnl.       Family history of colonic polyps    Colonoscopy 01/02/14.  Recommended f/u in 10 years.       Health care maintenance - Primary    Physical today 04/08/15.  Mammogram 04/03/15 - Birads I.  Colonoscopy 01/02/14.  Recommended f/u colonoscopy in 10 years.        Hypercholesterolemia    Recent cholesterol elevated.  Discussed diet and exercise.  She is not taking her medication regularly.  Discussed changing to crestor - given level.  She declines.  Wants to continue 20mg  of pravastatin and take regularly.  Recheck cholesterol and liver function tests in 6 weeks.        Obesity (BMI 30-39.9)    Diet and exercise.        Stress    Doing well.            Einar Pheasant, MD

## 2015-04-08 NOTE — Assessment & Plan Note (Signed)
Colonoscopy 01/02/14.  Recommended f/u in 10 years.

## 2015-04-08 NOTE — Assessment & Plan Note (Signed)
Doing well 

## 2015-08-12 ENCOUNTER — Ambulatory Visit (INDEPENDENT_AMBULATORY_CARE_PROVIDER_SITE_OTHER): Payer: Managed Care, Other (non HMO) | Admitting: Internal Medicine

## 2015-08-12 ENCOUNTER — Encounter: Payer: Self-pay | Admitting: Internal Medicine

## 2015-08-12 VITALS — BP 120/80 | HR 65 | Temp 98.3°F | Resp 18 | Ht 60.5 in | Wt 155.0 lb

## 2015-08-12 DIAGNOSIS — Z8371 Family history of colonic polyps: Secondary | ICD-10-CM

## 2015-08-12 DIAGNOSIS — E669 Obesity, unspecified: Secondary | ICD-10-CM | POA: Diagnosis not present

## 2015-08-12 DIAGNOSIS — Z658 Other specified problems related to psychosocial circumstances: Secondary | ICD-10-CM | POA: Diagnosis not present

## 2015-08-12 DIAGNOSIS — R030 Elevated blood-pressure reading, without diagnosis of hypertension: Secondary | ICD-10-CM

## 2015-08-12 DIAGNOSIS — E7800 Pure hypercholesterolemia, unspecified: Secondary | ICD-10-CM

## 2015-08-12 DIAGNOSIS — Z83719 Family history of colon polyps, unspecified: Secondary | ICD-10-CM

## 2015-08-12 DIAGNOSIS — F439 Reaction to severe stress, unspecified: Secondary | ICD-10-CM

## 2015-08-12 DIAGNOSIS — IMO0001 Reserved for inherently not codable concepts without codable children: Secondary | ICD-10-CM

## 2015-08-12 MED ORDER — PRAVASTATIN SODIUM 20 MG PO TABS: ORAL_TABLET | ORAL | Status: DC

## 2015-08-12 NOTE — Progress Notes (Signed)
Pre-visit discussion using our clinic review tool. No additional management support is needed unless otherwise documented below in the visit note.  

## 2015-08-12 NOTE — Progress Notes (Signed)
Patient ID: Leah Espinoza, female   DOB: 16-May-1956, 59 y.o.   MRN: [id number]   Subjective:    Patient ID: Leah Espinoza, female    DOB: 21-Nov-1955, 59 y.o.   MRN: [id number]  HPI  Patient with past history of hypercholesterolemia who comes in today to follow up on this issue.  She is doing well.  Not exercising.  Not watching her diet.  We discussed this today.  Discussed diet and exercise.  No cardiac symptoms with increased activity or exertion.  Breathing stable.  Bowels stable.  Not taking her cholesterol medication regularly.  Cholesterol levels reviewed.  Slighlty improved.  Handling stress.    Past Medical History  Diagnosis Date  . History of chicken pox   . History of fainting   . Hyperlipidemia    No past surgical history on file. Family History  Problem Relation Age of Onset  . Hyperlipidemia Mother   . Heart disease Mother   . Cancer Father     lung  . Sudden death Brother   . Arthritis Maternal Grandmother    Social History   Social History  . Marital Status: Married    Spouse Name: N/A  . Number of Children: N/A  . Years of Education: N/A   Social History Main Topics  . Smoking status: Former Research scientist (life sciences)  . Smokeless tobacco: Never Used  . Alcohol Use: 0.0 oz/week    0 Standard drinks or equivalent per week  . Drug Use: No  . Sexual Activity: Not Asked   Other Topics Concern  . None   Social History Narrative    Outpatient Encounter Prescriptions as of 08/12/2015  Medication Sig  . pravastatin (PRAVACHOL) 20 MG tablet Pt wants delivered to [facility]  . [DISCONTINUED] pravastatin (PRAVACHOL) 20 MG tablet Pt wants delivered to [facility]   No facility-administered encounter medications on file as of 08/12/2015.    Review of Systems  Constitutional: Negative for appetite change and unexpected weight change.  HENT: Negative for congestion and sinus pressure.   Eyes: Negative for pain and discharge.  Respiratory: Negative for cough,  chest tightness and shortness of breath.   Cardiovascular: Negative for chest pain, palpitations and leg swelling.  Gastrointestinal: Negative for nausea, vomiting, abdominal pain and diarrhea.  Genitourinary: Negative for dysuria and difficulty urinating.  Musculoskeletal: Negative for back pain and joint swelling.  Skin: Negative for color change and rash.  Neurological: Negative for dizziness, light-headedness and headaches.  Psychiatric/Behavioral: Negative for dysphoric mood and agitation.       Objective:     Blood pressure rechecked by me:  134/84  Physical Exam  Constitutional: She appears well-developed and well-nourished. No distress.  HENT:  Nose: Nose normal.  Mouth/Throat: Oropharynx is clear and moist.  Eyes: Conjunctivae are normal. Right eye exhibits no discharge. Left eye exhibits no discharge.  Neck: Neck supple. No thyromegaly present.  Cardiovascular: Normal rate and regular rhythm.   Pulmonary/Chest: Breath sounds normal. No respiratory distress. She has no wheezes.  Abdominal: Soft. Bowel sounds are normal. There is no tenderness.  Musculoskeletal: She exhibits no edema or tenderness.  Lymphadenopathy:    She has no cervical adenopathy.  Skin: No rash noted. No erythema.  Psychiatric: She has a normal mood and affect. Her behavior is normal.    BP 120/80 mmHg  Pulse 65  Temp(Src) 98.3 F (36.8 C) (Oral)  Resp 18  Ht 5' 0.5" (1.537 m)  Wt 155 lb (70.308 kg)  BMI 29.76 kg/m2  SpO2 97% Wt Readings from Last 3 Encounters:  08/12/15 155 lb (70.308 kg)  04/08/15 152 lb (68.947 kg)  12/04/14 152 lb (68.947 kg)     Lab Results  Component Value Date   WBC 6.8 11/27/2014   HGB 12.7 11/27/2014   HCT 37 11/27/2014   PLT 355 11/27/2014   CHOL 288* 04/04/2015   TRIG 101 04/04/2015   HDL 60 04/04/2015   LDLCALC 208 04/04/2015   ALT 15 04/04/2015   AST 16 04/04/2015   NA 144 04/04/2015   K 4.3 04/04/2015   CREATININE 1.0 04/04/2015   BUN 12  04/04/2015   TSH 1.07 11/27/2014    Mm Digital Screening Bilateral  04/03/2015  CLINICAL DATA:  Screening. EXAM: DIGITAL SCREENING BILATERAL MAMMOGRAM WITH CAD COMPARISON:  Previous exam(s). ACR Breast Density Category b: There are scattered areas of fibroglandular density. FINDINGS: There are no findings suspicious for malignancy. Images were processed with CAD. IMPRESSION: No mammographic evidence of malignancy. A result letter of this screening mammogram will be mailed directly to the patient. RECOMMENDATION: Screening mammogram in one year. (Code:SM-B-01Y) BI-RADS CATEGORY  1: Negative. Electronically Signed   By: Abelardo Diesel M.D.   On: 04/03/2015 14:26       Assessment & Plan:   Problem List Items Addressed This Visit    Elevated blood pressure    Blood pressure as outlined.  Doing better.  Follow.        Family history of colonic polyps    Colonoscopy 01/02/14.  Recommended f/u colonoscopy in 10 years.        Hypercholesterolemia    On pravastatin.  Low cholesterol diet and exercise.  Needs to take regularly.  Not taking regularly.  Follow lipid panel and liver function tests.        Relevant Medications   pravastatin (PRAVACHOL) 20 MG tablet   Obesity (BMI 30-39.9)    Diet and exercise.        Stress - Primary    Handling stress.  Follow.            Einar Pheasant, MD

## 2015-08-17 ENCOUNTER — Encounter: Payer: Self-pay | Admitting: Internal Medicine

## 2015-08-17 NOTE — Assessment & Plan Note (Signed)
Diet and exercise.   

## 2015-08-17 NOTE — Assessment & Plan Note (Signed)
Colonoscopy 01/02/14.  Recommended f/u colonoscopy in 10 years.

## 2015-08-17 NOTE — Assessment & Plan Note (Signed)
Blood pressure as outlined.  Doing better.  Follow.

## 2015-08-17 NOTE — Assessment & Plan Note (Signed)
Handling stress.  Follow.   

## 2015-08-17 NOTE — Assessment & Plan Note (Signed)
On pravastatin.  Low cholesterol diet and exercise.  Needs to take regularly.  Not taking regularly.  Follow lipid panel and liver function tests.

## 2015-11-18 ENCOUNTER — Other Ambulatory Visit: Payer: Self-pay | Admitting: Internal Medicine

## 2015-11-19 LAB — TSH: TSH: 1.98 u[IU]/mL (ref 0.450–4.500)

## 2015-11-24 ENCOUNTER — Encounter: Payer: Self-pay | Admitting: *Deleted

## 2015-12-09 ENCOUNTER — Other Ambulatory Visit: Payer: Self-pay | Admitting: Internal Medicine

## 2015-12-10 LAB — LIPID PANEL W/O CHOL/HDL RATIO
Cholesterol, Total: 284 mg/dL — ABNORMAL HIGH (ref 100–199)
HDL: 54 mg/dL (ref >39)
LDL Calculated: 212 mg/dL — ABNORMAL HIGH (ref 0–99)
TRIGLYCERIDES: 89 mg/dL (ref 0–149)
VLDL CHOLESTEROL CAL: 18 mg/dL (ref 5–40)

## 2015-12-10 LAB — BASIC METABOLIC PANEL
BUN/Creatinine Ratio: 14 (ref 11–26)
BUN: 14 mg/dL (ref 8–27)
CALCIUM: 9.1 mg/dL (ref 8.7–10.3)
CHLORIDE: 101 mmol/L (ref 96–106)
CO2: 25 mmol/L (ref 18–29)
Creatinine, Ser: 1.02 mg/dL — ABNORMAL HIGH (ref 0.57–1.00)
GFR calc non Af Amer: 60 mL/min/{1.73_m2} (ref >59)
GFR, EST AFRICAN AMERICAN: 69 mL/min/{1.73_m2} (ref >59)
GLUCOSE: 103 mg/dL — AB (ref 65–99)
POTASSIUM: 4.3 mmol/L (ref 3.5–5.2)
Sodium: 141 mmol/L (ref 134–144)

## 2015-12-10 LAB — CBC WITH DIFFERENTIAL/PLATELET
BASOS ABS: 0 10*3/uL (ref 0.0–0.2)
Basos: 1 %
EOS (ABSOLUTE): 0.1 10*3/uL (ref 0.0–0.4)
Eos: 2 %
Hematocrit: 40 % (ref 34.0–46.6)
Hemoglobin: 13.3 g/dL (ref 11.1–15.9)
IMMATURE GRANS (ABS): 0 10*3/uL (ref 0.0–0.1)
IMMATURE GRANULOCYTES: 0 %
Lymphocytes Absolute: 2.3 10*3/uL (ref 0.7–3.1)
Lymphs: 49 %
MCH: 31.1 pg (ref 26.6–33.0)
MCHC: 33.3 g/dL (ref 31.5–35.7)
MCV: 94 fL (ref 79–97)
MONOS ABS: 0.4 10*3/uL (ref 0.1–0.9)
Monocytes: 8 %
NEUTROS PCT: 40 %
Neutrophils Absolute: 1.9 10*3/uL (ref 1.4–7.0)
Platelets: 280 10*3/uL (ref 150–379)
RBC: 4.28 x10E6/uL (ref 3.77–5.28)
RDW: 14.5 % (ref 12.3–15.4)
WBC: 4.7 10*3/uL (ref 3.4–10.8)

## 2015-12-10 LAB — HEPATIC FUNCTION PANEL
ALT: 14 IU/L (ref 0–32)
AST: 16 IU/L (ref 0–40)
Albumin: 4.2 g/dL (ref 3.6–4.8)
Alkaline Phosphatase: 87 IU/L (ref 39–117)
BILIRUBIN, DIRECT: 0.08 mg/dL (ref 0.00–0.40)
Bilirubin Total: 0.3 mg/dL (ref 0.0–1.2)
TOTAL PROTEIN: 7.2 g/dL (ref 6.0–8.5)

## 2015-12-10 LAB — TSH: TSH: 2.02 u[IU]/mL (ref 0.450–4.500)

## 2015-12-11 ENCOUNTER — Ambulatory Visit (INDEPENDENT_AMBULATORY_CARE_PROVIDER_SITE_OTHER): Payer: Managed Care, Other (non HMO) | Admitting: Internal Medicine

## 2015-12-11 ENCOUNTER — Encounter: Payer: Self-pay | Admitting: Internal Medicine

## 2015-12-11 VITALS — BP 120/80 | HR 52 | Temp 98.0°F | Resp 18 | Ht 60.5 in | Wt 157.2 lb

## 2015-12-11 DIAGNOSIS — F439 Reaction to severe stress, unspecified: Secondary | ICD-10-CM

## 2015-12-11 DIAGNOSIS — Z658 Other specified problems related to psychosocial circumstances: Secondary | ICD-10-CM | POA: Diagnosis not present

## 2015-12-11 DIAGNOSIS — R0789 Other chest pain: Secondary | ICD-10-CM

## 2015-12-11 DIAGNOSIS — Z8371 Family history of colonic polyps: Secondary | ICD-10-CM

## 2015-12-11 DIAGNOSIS — R030 Elevated blood-pressure reading, without diagnosis of hypertension: Secondary | ICD-10-CM

## 2015-12-11 DIAGNOSIS — Z83719 Family history of colon polyps, unspecified: Secondary | ICD-10-CM

## 2015-12-11 DIAGNOSIS — E7800 Pure hypercholesterolemia, unspecified: Secondary | ICD-10-CM | POA: Diagnosis not present

## 2015-12-11 DIAGNOSIS — E669 Obesity, unspecified: Secondary | ICD-10-CM | POA: Diagnosis not present

## 2015-12-11 DIAGNOSIS — IMO0001 Reserved for inherently not codable concepts without codable children: Secondary | ICD-10-CM

## 2015-12-11 NOTE — Progress Notes (Signed)
Patient ID: Leah Espinoza, female   DOB: 1956/06/25, 60 y.o.   MRN: [id number]   Subjective:    Patient ID: Leah Espinoza, female    DOB: 1956/07/17, 60 y.o.   MRN: [id number]  HPI  Patient here for a scheduled follow up.  She has noticed recently some chest tightness and tightness up to her jaw.  Notices when lying down.  Feels at times neck glands are swollen.  Not now.  She is trying to exercise.  Tries to stay active.  No chest pain with increased activity or exertion.  Breathing stable.  No nausea or vomiting.  Bowels stable.  Handling stress.    Past Medical History  Diagnosis Date  . History of chicken pox   . History of fainting   . Hyperlipidemia    No past surgical history on file. Family History  Problem Relation Age of Onset  . Hyperlipidemia Mother   . Heart disease Mother   . Cancer Father     lung  . Sudden death Brother   . Arthritis Maternal Grandmother    Social History   Social History  . Marital Status: Married    Spouse Name: N/A  . Number of Children: N/A  . Years of Education: N/A   Social History Main Topics  . Smoking status: Former Research scientist (life sciences)  . Smokeless tobacco: Never Used  . Alcohol Use: 0.0 oz/week    0 Standard drinks or equivalent per week  . Drug Use: No  . Sexual Activity: Not Asked   Other Topics Concern  . None   Social History Narrative    Outpatient Encounter Prescriptions as of 12/11/2015  Medication Sig  . pravastatin (PRAVACHOL) 20 MG tablet Pt wants delivered to [facility]   No facility-administered encounter medications on file as of 12/11/2015.    Review of Systems  Constitutional: Negative for appetite change and unexpected weight change.  HENT: Negative for congestion and sinus pressure.   Respiratory: Positive for chest tightness. Negative for cough and shortness of breath.   Cardiovascular: Negative for chest pain, palpitations and leg swelling.  Gastrointestinal: Negative for nausea, vomiting, abdominal  pain and diarrhea.  Genitourinary: Negative for dysuria and difficulty urinating.  Musculoskeletal: Negative for back pain and joint swelling.  Skin: Negative for color change and rash.  Neurological: Negative for dizziness, light-headedness and headaches.  Psychiatric/Behavioral: Negative for dysphoric mood and agitation.       Objective:    Physical Exam  Constitutional: She appears well-developed and well-nourished. No distress.  HENT:  Nose: Nose normal.  Mouth/Throat: Oropharynx is clear and moist.  Neck: Neck supple. No thyromegaly present.  Cardiovascular: Normal rate and regular rhythm.   Pulmonary/Chest: Breath sounds normal. No respiratory distress. She has no wheezes.  Abdominal: Soft. Bowel sounds are normal. There is no tenderness.  Musculoskeletal: She exhibits no edema or tenderness.  Lymphadenopathy:    She has no cervical adenopathy.  Skin: No rash noted. No erythema.  Psychiatric: She has a normal mood and affect. Her behavior is normal.    BP 120/80 mmHg  Pulse 52  Temp(Src) 98 F (36.7 C) (Oral)  Resp 18  Ht 5' 0.5" (1.537 m)  Wt 157 lb 4 oz (71.328 kg)  BMI 30.19 kg/m2  SpO2 97% Wt Readings from Last 3 Encounters:  12/11/15 157 lb 4 oz (71.328 kg)  08/12/15 155 lb (70.308 kg)  04/08/15 152 lb (68.947 kg)     Lab Results  Component Value Date  WBC 4.7 12/09/2015   HGB 12.7 11/27/2014   HCT 40.0 12/09/2015   PLT 280 12/09/2015   GLUCOSE 103* 12/09/2015   CHOL 284* 12/09/2015   TRIG 89 12/09/2015   HDL 54 12/09/2015   LDLCALC 212* 12/09/2015   ALT 14 12/09/2015   AST 16 12/09/2015   NA 141 12/09/2015   K 4.3 12/09/2015   CL 101 12/09/2015   CREATININE 1.02* 12/09/2015   BUN 14 12/09/2015   CO2 25 12/09/2015   TSH 2.020 12/09/2015    Mm Digital Screening Bilateral  04/03/2015  CLINICAL DATA:  Screening. EXAM: DIGITAL SCREENING BILATERAL MAMMOGRAM WITH CAD COMPARISON:  Previous exam(s). ACR Breast Density Category b: There are  scattered areas of fibroglandular density. FINDINGS: There are no findings suspicious for malignancy. Images were processed with CAD. IMPRESSION: No mammographic evidence of malignancy. A result letter of this screening mammogram will be mailed directly to the patient. RECOMMENDATION: Screening mammogram in one year. (Code:SM-B-01Y) BI-RADS CATEGORY  1: Negative. Electronically Signed   By: Abelardo Diesel M.D.   On: 04/03/2015 14:26       Assessment & Plan:   Problem List Items Addressed This Visit    Chest tightness - Primary   Relevant Orders   EKG 12-Lead (Completed)   Elevated blood pressure    Blood pressure on recheck a little elevated.  Have her spot check her pressure.  She declines blood pressure medication.  Follow.  Get her back in soon to reassess.        Family history of colonic polyps    Colonoscopy 01/02/14 - recommended f/u colonoscopy in 10 years.        Hypercholesterolemia    On pravastatin.  Not taking regularly.  Needs to take on a regular basis.  Follow lipid panel and liver function tests.        Obesity (BMI 30-39.9)    Discussed diet and exercise.  Follow.        Stress    Handling stress.  Follow.            Einar Pheasant, MD

## 2015-12-11 NOTE — Progress Notes (Signed)
Pre-visit discussion using our clinic review tool. No additional management support is needed unless otherwise documented below in the visit note.  

## 2015-12-14 DIAGNOSIS — R0789 Other chest pain: Secondary | ICD-10-CM | POA: Insufficient documentation

## 2015-12-14 NOTE — Assessment & Plan Note (Signed)
On pravastatin.  Not taking regularly.  Needs to take on a regular basis.  Follow lipid panel and liver function tests.

## 2015-12-14 NOTE — Assessment & Plan Note (Signed)
Colonoscopy 01/02/14 - recommended f/u colonoscopy in 10 years.

## 2015-12-14 NOTE — Assessment & Plan Note (Signed)
Discussed diet and exercise.  Follow.  

## 2015-12-14 NOTE — Assessment & Plan Note (Signed)
Blood pressure on recheck a little elevated.  Have her spot check her pressure.  She declines blood pressure medication.  Follow.  Get her back in soon to reassess.

## 2015-12-14 NOTE — Assessment & Plan Note (Signed)
EKG obtained and revealed SR with no acute ischemic changes.  Discussed further cardiac w/up given risk factors.  She declines.  Continue risk factor modification.  Follow.   Will notify me if she changes her mind.

## 2015-12-14 NOTE — Assessment & Plan Note (Signed)
Handling stress.  Follow.   

## 2015-12-15 ENCOUNTER — Other Ambulatory Visit: Payer: Self-pay | Admitting: *Deleted

## 2015-12-15 MED ORDER — PRAVASTATIN SODIUM 20 MG PO TABS: ORAL_TABLET | ORAL | Status: DC

## 2015-12-16 ENCOUNTER — Other Ambulatory Visit: Payer: Self-pay

## 2015-12-16 MED ORDER — PRAVASTATIN SODIUM 20 MG PO TABS: ORAL_TABLET | ORAL | Status: DC

## 2015-12-23 ENCOUNTER — Inpatient Hospital Stay
Admission: EM | Admit: 2015-12-23 | Discharge: 2015-12-25 | DRG: 247 | Disposition: A | Discharge status: Home / Self Care | Payer: Managed Care, Other (non HMO) | Attending: Cardiology | Admitting: Cardiology

## 2015-12-23 ENCOUNTER — Emergency Department: Payer: Managed Care, Other (non HMO)

## 2015-12-23 ENCOUNTER — Encounter
Admission: EM | Disposition: A | Discharge status: Home / Self Care | Payer: Self-pay | Source: Home / Self Care | Attending: Cardiology

## 2015-12-23 ENCOUNTER — Encounter: Payer: Self-pay | Admitting: Emergency Medicine

## 2015-12-23 DIAGNOSIS — E785 Hyperlipidemia, unspecified: Secondary | ICD-10-CM | POA: Diagnosis present

## 2015-12-23 DIAGNOSIS — Z8249 Family history of ischemic heart disease and other diseases of the circulatory system: Secondary | ICD-10-CM | POA: Diagnosis not present

## 2015-12-23 DIAGNOSIS — I10 Essential (primary) hypertension: Secondary | ICD-10-CM | POA: Diagnosis present

## 2015-12-23 DIAGNOSIS — Z87891 Personal history of nicotine dependence: Secondary | ICD-10-CM | POA: Diagnosis not present

## 2015-12-23 DIAGNOSIS — I2582 Chronic total occlusion of coronary artery: Secondary | ICD-10-CM | POA: Diagnosis present

## 2015-12-23 DIAGNOSIS — Z79899 Other long term (current) drug therapy: Secondary | ICD-10-CM | POA: Diagnosis not present

## 2015-12-23 DIAGNOSIS — I2510 Atherosclerotic heart disease of native coronary artery without angina pectoris: Secondary | ICD-10-CM | POA: Diagnosis present

## 2015-12-23 DIAGNOSIS — Z801 Family history of malignant neoplasm of trachea, bronchus and lung: Secondary | ICD-10-CM | POA: Diagnosis not present

## 2015-12-23 DIAGNOSIS — I2109 ST elevation (STEMI) myocardial infarction involving other coronary artery of anterior wall: Principal | ICD-10-CM | POA: Diagnosis present

## 2015-12-23 DIAGNOSIS — I2119 ST elevation (STEMI) myocardial infarction involving other coronary artery of inferior wall: Secondary | ICD-10-CM | POA: Diagnosis present

## 2015-12-23 DIAGNOSIS — I213 ST elevation (STEMI) myocardial infarction of unspecified site: Secondary | ICD-10-CM | POA: Diagnosis present

## 2015-12-23 HISTORY — DX: Essential (primary) hypertension: I10

## 2015-12-23 HISTORY — PX: CARDIAC CATHETERIZATION: SHX172

## 2015-12-23 LAB — PLATELET COUNT: Platelets: 219 10*3/uL (ref 150–440)

## 2015-12-23 LAB — PROTIME-INR: INR: >10

## 2015-12-23 LAB — COMPREHENSIVE METABOLIC PANEL
ALBUMIN: 3.3 g/dL — AB (ref 3.5–5.0)
ALT: 14 U/L (ref 14–54)
AST: 23 U/L (ref 15–41)
Alkaline Phosphatase: 67 U/L (ref 38–126)
Anion gap: 6 (ref 5–15)
BILIRUBIN TOTAL: 0.5 mg/dL (ref 0.3–1.2)
BUN: 19 mg/dL (ref 6–20)
CO2: 23 mmol/L (ref 22–32)
Calcium: 8 mg/dL — ABNORMAL LOW (ref 8.9–10.3)
Chloride: 106 mmol/L (ref 101–111)
Creatinine, Ser: 0.89 mg/dL (ref 0.44–1.00)
GFR calc Af Amer: >60 mL/min (ref >60)
GFR calc non Af Amer: >60 mL/min (ref >60)
GLUCOSE: 114 mg/dL — AB (ref 65–99)
POTASSIUM: 3.2 mmol/L — AB (ref 3.5–5.1)
Sodium: 135 mmol/L (ref 135–145)
Total Protein: 6.7 g/dL (ref 6.5–8.1)

## 2015-12-23 LAB — CBC
HEMATOCRIT: 36.1 % (ref 35.0–47.0)
Hemoglobin: 12.2 g/dL (ref 12.0–16.0)
MCH: 31.5 pg (ref 26.0–34.0)
MCHC: 33.7 g/dL (ref 32.0–36.0)
MCV: 93.4 fL (ref 80.0–100.0)
Platelets: 241 10*3/uL (ref 150–440)
RBC: 3.86 MIL/uL (ref 3.80–5.20)
RDW: 13.7 % (ref 11.5–14.5)
WBC: 9.2 10*3/uL (ref 3.6–11.0)

## 2015-12-23 LAB — TROPONIN I
Troponin I: <0.03 ng/mL (ref <0.031)
Troponin I: 24.5 ng/mL — ABNORMAL HIGH (ref <0.031)
Troponin I: >65 ng/mL — ABNORMAL HIGH (ref <0.031)

## 2015-12-23 LAB — MAGNESIUM: Magnesium: 1.9 mg/dL (ref 1.7–2.4)

## 2015-12-23 LAB — MRSA PCR SCREENING: MRSA BY PCR: NEGATIVE

## 2015-12-23 LAB — APTT

## 2015-12-23 SURGERY — LEFT HEART CATH AND CORONARY ANGIOGRAPHY
Anesthesia: Moderate Sedation

## 2015-12-23 MED ORDER — ASPIRIN 300 MG RE SUPP
300.0000 mg | RECTAL | Status: AC
Start: 2015-12-23 — End: 2015-12-24
  Filled 2015-12-23: qty 1

## 2015-12-23 MED ORDER — ASPIRIN EC 325 MG PO TBEC
325.0000 mg | DELAYED_RELEASE_TABLET | Freq: Every day | ORAL | Status: DC
Start: 2015-12-24 — End: 2015-12-23

## 2015-12-23 MED ORDER — FENTANYL CITRATE (PF) 100 MCG/2ML IJ SOLN
INTRAMUSCULAR | Status: DC | PRN
  Administered 2015-12-23: 25 ug via INTRAVENOUS

## 2015-12-23 MED ORDER — TICAGRELOR 90 MG PO TABS
90.0000 mg | ORAL_TABLET | Freq: Two times a day (BID) | ORAL | Status: DC
  Administered 2015-12-24 – 2015-12-25 (×3): 90 mg via ORAL
  Filled 2015-12-23 (×3): qty 1

## 2015-12-23 MED ORDER — METOPROLOL TARTRATE 25 MG PO TABS
12.5000 mg | ORAL_TABLET | Freq: Two times a day (BID) | ORAL | Status: DC
  Administered 2015-12-23 – 2015-12-24 (×2): 12.5 mg via ORAL
  Filled 2015-12-23 (×3): qty 1

## 2015-12-23 MED ORDER — HEPARIN (PORCINE) IN NACL 2-0.9 UNIT/ML-% IJ SOLN
INTRAMUSCULAR | Status: AC
  Filled 2015-12-23: qty 1000

## 2015-12-23 MED ORDER — SODIUM CHLORIDE 0.9 % IV SOLN: 250.0000 mL | INTRAVENOUS | Status: DC | PRN

## 2015-12-23 MED ORDER — TICAGRELOR 90 MG PO TABS
ORAL_TABLET | ORAL | Status: AC
  Filled 2015-12-23: qty 2

## 2015-12-23 MED ORDER — MIDAZOLAM HCL 2 MG/2ML IJ SOLN
INTRAMUSCULAR | Status: AC
Start: 2015-12-23 — End: 2015-12-23
  Filled 2015-12-23: qty 2

## 2015-12-23 MED ORDER — ASPIRIN 81 MG PO CHEW
324.0000 mg | CHEWABLE_TABLET | ORAL | Status: AC
Start: 2015-12-23 — End: 2015-12-24

## 2015-12-23 MED ORDER — SODIUM CHLORIDE 0.9% FLUSH
3.0000 mL | INTRAVENOUS | Status: DC | PRN
  Administered 2015-12-25: 6 mL via INTRAVENOUS
  Filled 2015-12-23: qty 3

## 2015-12-23 MED ORDER — SODIUM CHLORIDE 0.9 % IV SOLN
0.2500 mg/kg/h | INTRAVENOUS | Status: AC
  Filled 2015-12-23: qty 250

## 2015-12-23 MED ORDER — TIROFIBAN HCL IN NACL 5-0.9 MG/100ML-% IV SOLN
INTRAVENOUS | Status: DC | PRN
Start: 2015-12-23 — End: 2015-12-23
  Administered 2015-12-23: 0.15 ug/kg/min via INTRAVENOUS

## 2015-12-23 MED ORDER — SODIUM CHLORIDE 0.9 % WEIGHT BASED INFUSION
1.0000 mL/kg/h | INTRAVENOUS | Status: AC
  Administered 2015-12-23: 1 mL/kg/h via INTRAVENOUS

## 2015-12-23 MED ORDER — BIVALIRUDIN 250 MG IV SOLR
INTRAVENOUS | Status: AC
  Filled 2015-12-23: qty 250

## 2015-12-23 MED ORDER — ONDANSETRON HCL 4 MG/2ML IJ SOLN
INTRAMUSCULAR | Status: AC
  Filled 2015-12-23: qty 2

## 2015-12-23 MED ORDER — MIDAZOLAM HCL 2 MG/2ML IJ SOLN
INTRAMUSCULAR | Status: DC | PRN
  Administered 2015-12-23: 0.5 mg via INTRAVENOUS

## 2015-12-23 MED ORDER — FENTANYL CITRATE (PF) 100 MCG/2ML IJ SOLN
INTRAMUSCULAR | Status: AC
  Filled 2015-12-23: qty 2

## 2015-12-23 MED ORDER — ACETAMINOPHEN 325 MG PO TABS: 650.0000 mg | ORAL_TABLET | ORAL | Status: DC | PRN

## 2015-12-23 MED ORDER — NITROGLYCERIN 5 MG/ML IV SOLN
INTRAVENOUS | Status: AC
Start: 2015-12-23 — End: 2015-12-23
  Filled 2015-12-23: qty 10

## 2015-12-23 MED ORDER — TIROFIBAN HCL IN NACL 5-0.9 MG/100ML-% IV SOLN: 0.1500 ug/kg/min | INTRAVENOUS | Status: AC

## 2015-12-23 MED ORDER — TICAGRELOR 90 MG PO TABS
ORAL_TABLET | ORAL | Status: DC | PRN
Start: 2015-12-23 — End: 2015-12-23
  Administered 2015-12-23: 180 mg via ORAL

## 2015-12-23 MED ORDER — POTASSIUM CHLORIDE CRYS ER 20 MEQ PO TBCR
20.0000 meq | EXTENDED_RELEASE_TABLET | Freq: Once | ORAL | Status: AC
  Administered 2015-12-23: 20 meq via ORAL
  Filled 2015-12-23: qty 1

## 2015-12-23 MED ORDER — ONDANSETRON HCL 4 MG/2ML IJ SOLN
4.0000 mg | Freq: Four times a day (QID) | INTRAMUSCULAR | Status: DC | PRN
  Administered 2015-12-24: 4 mg via INTRAVENOUS
  Filled 2015-12-23: qty 2

## 2015-12-23 MED ORDER — ONDANSETRON HCL 4 MG/2ML IJ SOLN
INTRAMUSCULAR | Status: DC | PRN
  Administered 2015-12-23: 4 mg via INTRAVENOUS

## 2015-12-23 MED ORDER — SODIUM CHLORIDE 0.9% FLUSH
3.0000 mL | Freq: Two times a day (BID) | INTRAVENOUS | Status: DC
  Administered 2015-12-23 – 2015-12-25 (×4): 3 mL via INTRAVENOUS

## 2015-12-23 MED ORDER — BIVALIRUDIN BOLUS VIA INFUSION - CUPID
INTRAVENOUS | Status: DC | PRN
  Administered 2015-12-23: 55.575 mg via INTRAVENOUS

## 2015-12-23 MED ORDER — NITROGLYCERIN 1 MG/10 ML FOR IR/CATH LAB
INTRA_ARTERIAL | Status: DC | PRN
  Administered 2015-12-23: 200 ug via INTRACORONARY

## 2015-12-23 MED ORDER — PRAVASTATIN SODIUM 20 MG PO TABS
20.0000 mg | ORAL_TABLET | Freq: Every day | ORAL | Status: DC
  Administered 2015-12-24 – 2015-12-25 (×2): 20 mg via ORAL
  Filled 2015-12-23 (×2): qty 1

## 2015-12-23 MED ORDER — ASPIRIN EC 81 MG PO TBEC
81.0000 mg | DELAYED_RELEASE_TABLET | Freq: Every day | ORAL | Status: DC
  Administered 2015-12-24 – 2015-12-25 (×2): 81 mg via ORAL
  Filled 2015-12-23 (×2): qty 1

## 2015-12-23 MED ORDER — SODIUM CHLORIDE 0.9 % IV SOLN
250.0000 mg | INTRAVENOUS | Status: DC | PRN
  Administered 2015-12-23: 1.75 mg/kg/h via INTRAVENOUS

## 2015-12-23 SURGICAL SUPPLY — 16 items
BALLN TREK RX 2.5X12 (BALLOONS) ×3
BALLOON TREK RX 2.5X12 (BALLOONS) ×1 IMPLANT
CATH INFINITI 5FR ANG PIGTAIL (CATHETERS) ×3 IMPLANT
CATH INFINITI 5FR JL4 (CATHETERS) IMPLANT
CATH INFINITI JR4 5F (CATHETERS) ×3 IMPLANT
CATH VISTA GUIDE 6FR XB3.5 (CATHETERS) ×3 IMPLANT
DEVICE CLOSURE MYNXGRIP 6/7F (Vascular Products) ×3 IMPLANT
DEVICE INFLAT 30 PLUS (MISCELLANEOUS) ×3 IMPLANT
KIT MANI 3VAL PERCEP (MISCELLANEOUS) ×3 IMPLANT
NEEDLE PERC 18GX7CM (NEEDLE) ×3 IMPLANT
PACK CARDIAC CATH (CUSTOM PROCEDURE TRAY) ×3 IMPLANT
SHEATH AVANTI 5FR X 11CM (SHEATH) IMPLANT
SHEATH AVANTI 6FR X 11CM (SHEATH) ×3 IMPLANT
STENT XIENCE ALPINE RX 2.75X23 (Permanent Stent) ×3 IMPLANT
WIRE ASAHI PROWATER 180CM (WIRE) ×6 IMPLANT
WIRE EMERALD 3MM-J .035X150CM (WIRE) ×3 IMPLANT

## 2015-12-23 NOTE — Progress Notes (Signed)
Critical values called by lab.  Informed Dr Eston Esters of result.  No new orders received.

## 2015-12-23 NOTE — ED Notes (Signed)
Cardiologist at bedside at this time.

## 2015-12-23 NOTE — Progress Notes (Signed)
Ticagrelor and Aspirin Dose Limits  Patient is a 60 yo female admitted with STEMI and status post cardiac cath with stent placement.  Patient currently ordered ticagrelor 90 mg po BID and aspirin 325 mg po daily to start on 4/12.  Per policy, will transition patient to aspirin 81 mg po daily.  Per manufacturer recommendations:    The benefits of ticagrelor relative to clopidogrel may be diminished in adult patients receiving daily aspirin doses greater than 100-150 mg daily. Avoid daily aspirin doses greater than 100 mg in adults receiving ticagrelor.  Murrell Converse, PharmD Clinical Pharmacist 12/23/2015

## 2015-12-23 NOTE — ED Notes (Addendum)
Pt via EMS for cp and SOB and syncopal episode at work.  EMS 4 Asprin, 1 nitro, 0.5 atropine. 18 Rt AC , 20 left hand. 108/60, 40 then came to 80 after atropine, 98% on RA. Pt A&O at this time. Code stemi called.

## 2015-12-23 NOTE — ED Notes (Signed)
Consent signed.

## 2015-12-23 NOTE — ED Provider Notes (Signed)
[facility] Emergency Department Provider Note  ____________________________________________  Time seen: Approximately 3:13 PM  I have reviewed the triage vital signs and the nursing notes.   HISTORY  Chief Complaint Code STEMI    HPI Leah Espinoza is a 60 y.o. female with a history of hypercholesterolemia presents by EMS with concerns for STEMI.  She has had intermittent mild chest pain and shortness of breath or couple of times several weeks ago.  She followed up with her primary care doctor and had an EKG that was reportedly unremarkable.  Today she was at work and developed chest pain and shortness of breath and then had a syncopal episode where she completely lost consciousness.  She was pale and diaphoretic and EMS was called.  When they arrived they told me that her heart rate was in the 40s and she was complaining of chest pain.  They administered atropine 0.5 mg IV and her heart rate improved to the 80s.  They give her 1 nitroglycerin for the chest pain.  She was normotensive at the time.  This started a 500 mL normal saline bolus and gave her a full dose aspirin.  Their initial rhythm strip was concerning for ST segment elevation most notable in lead V2 but also with reciprocal depression in lead 3.  They brought her emergently to the emergency department for further evaluation.  The patient reports that her pain is mild to moderate at this time and feels like a heavy pressure in her chest with some associated shortness of breath.  She reports the pain radiates up to her jaw.  She is pale and mildly diaphoretic currently.  She is alert and oriented   Past Medical History  Diagnosis Date  . History of chicken pox   . History of fainting   . Hyperlipidemia   . Hypertension     Patient Active Problem List   Diagnosis Date Noted  . Chest tightness 12/14/2015  . Health care maintenance 12/09/2014  . Stress 08/05/2014  . Obesity (BMI 30-39.9) 08/05/2014   . Rectal bleeding 11/18/2013  . Family history of colonic polyps 11/18/2013  . Hypercholesterolemia 11/18/2013  . Elevated blood pressure 11/18/2013    History reviewed. No pertinent past surgical history.  No current outpatient prescriptions on file.  Allergies Review of patient's allergies indicates no known allergies.  Family History  Problem Relation Age of Onset  . Hyperlipidemia Mother   . Heart disease Mother   . Cancer Father     lung  . Sudden death Brother   . Arthritis Maternal Grandmother     Social History Social History  Substance Use Topics  . Smoking status: Former Research scientist (life sciences)  . Smokeless tobacco: Never Used  . Alcohol Use: 0.0 oz/week    0 Standard drinks or equivalent per week    Review of Systems Constitutional: No fever/chills Eyes: No visual changes. ENT: No sore throat. Cardiovascular: Mild to moderate chest pressure Respiratory: Mild shortness of breath. Gastrointestinal: No abdominal pain.  No nausea, no vomiting.  No diarrhea.  No constipation. Genitourinary: Negative for dysuria. Musculoskeletal: Negative for back pain. Skin: Negative for rash. Neurological: Negative for headaches, focal weakness or numbness.  10-point ROS otherwise negative.  ____________________________________________   PHYSICAL EXAM:  ED Triage Vitals  Enc Vitals Group     BP 12/23/15 1517 118/79 mmHg     Pulse Rate 12/23/15 1512 82     Resp 12/23/15 1512 14     Temp --  Temp Source 12/23/15 1512 Oral     SpO2 12/23/15 1512 92 %     Weight 12/23/15 1519 163 lb 4.8 oz (74.072 kg)     Height 12/23/15 1519 5\' 3"  (1.6 m)     Head Cir --      Peak Flow --      Pain Score 12/23/15 1517 5     Pain Loc --      Pain Edu? --      Excl. in [place]? --     Constitutional: Alert and oriented. Ill appearing. Eyes: Conjunctivae are normal. PERRL. EOMI. Head: Atraumatic. Nose: No congestion/rhinnorhea. Mouth/Throat: Mucous membranes are moist.  Oropharynx  non-erythematous. Neck: No stridor.  No meningeal signs.   Cardiovascular: Normal rate, regular rhythm. Good peripheral circulation. Grossly normal heart sounds.   Respiratory: Normal respiratory effort.  No retractions. Lungs CTAB. Gastrointestinal: Soft and nontender. No distention.  Musculoskeletal: No lower extremity tenderness nor edema. No gross deformities of extremities. Neurologic:  Normal speech and language. No gross focal neurologic deficits are appreciated.  Skin:  Skin is cool, mildly diaphoretic, and pale.   Marland KitchenPsychiatric: Mood and affect are normal. Speech and behavior are normal.  ____________________________________________   LABS (all labs ordered are listed, but only abnormal results are displayed)  Labs Reviewed  CBC  COMPREHENSIVE METABOLIC PANEL  TROPONIN I  MAGNESIUM  PROTIME-INR  APTT   ____________________________________________  EKG  ED ECG REPORT I, Deejay Koppelman, the attending physician, personally viewed and interpreted this ECG.   Date: 12/23/2015  EKG Time: 15:13  Rate: 82  Rhythm: normal sinus rhythm  Axis: Left axis deviation  Intervals:Normal  ST&T Change: Markedly ST segment elevation in leads 1, aVL, V2.  Reciprocal changes of ST depression in leads 2, 3, aVF.  Consistent with acute STEMI, likely anterior distribution.  ____________________________________________  RADIOLOGY   No results found.  ____________________________________________   PROCEDURES  Procedure(s) performed: None  Critical Care performed: Yes, see critical care note(s)   CRITICAL CARE Performed by: Hinda Kehr   Total critical care time: less than 30 minutes  Critical care time was exclusive of separately billable procedures and treating other patients.  Critical care was necessary to treat or prevent imminent or life-threatening deterioration.  Critical care was time spent personally by me on the following activities: development of treatment  plan with patient and/or surrogate as well as nursing, discussions with consultants, evaluation of patient's response to treatment, examination of patient, obtaining history from patient or surrogate, ordering and performing treatments and interventions, ordering and review of laboratory studies, ordering and review of radiographic studies, pulse oximetry and re-evaluation of patient's condition.  ____________________________________________   INITIAL IMPRESSION / ASSESSMENT AND PLAN / ED COURSE  Pertinent labs & imaging results that were available during my care of the patient were reviewed by me and considered in my medical decision making (see chart for details).  Activated Code STEMI immediately.  Spoke by phone with Dr. Saralyn Pilar at 3:15pm.  He said to hold off on heparin and that he would come to the ED to evaluate the patient.  Dr. Saralyn Pilar arrived within minutes, evaluated the EKG, spoke with the patient, and took her emergently to the catheter lab.  Given that she went directly to the catheter lab.  He wanted to not give any additional medications as described above.  ____________________________________________  FINAL CLINICAL IMPRESSION(S) / ED DIAGNOSES  Final diagnoses:  ST elevation myocardial infarction (STEMI), unspecified artery ([place])  NEW MEDICATIONS STARTED DURING THIS VISIT:  Current Discharge Medication List        Note:  This document was prepared using Dragon voice recognition software and may include unintentional dictation errors.   Hinda Kehr, MD 12/23/15 1540

## 2015-12-23 NOTE — Progress Notes (Signed)
MEDICATION RELATED CONSULT NOTE - INITIAL   Pharmacy Consult for bivalirudin  Indication:ACS  No Known Allergies  Patient Measurements: Height: 5\' 3"  (160 cm) Weight: 163 lb 4.8 oz (74.072 kg) IBW/kg (Calculated) : 52.4  Vital Signs: Temp Source: Oral (04/11 1512) BP: 114/86 mmHg (04/11 1527) Pulse Rate: 77 (04/11 1527) Intake/Output from previous day:   Intake/Output from this shift:    Labs:  Recent Labs  12/23/15 1514  WBC 9.2  HGB 12.2  HCT 36.1  PLT 241  CREATININE 0.89  MG 1.9  ALBUMIN 3.3*  PROT 6.7  AST 23  ALT 14  ALKPHOS 67  BILITOT 0.5   Estimated Creatinine Clearance: 64.8 mL/min (by C-G formula based on Cr of 0.89).   Microbiology: No results found for this or any previous visit (from the past 720 hour(s)).  Medical History: Past Medical History  Diagnosis Date  . History of chicken pox   . History of fainting   . Hyperlipidemia   . Hypertension     Medications:  Scheduled:  . aspirin  324 mg Oral NOW   Or  . aspirin  300 mg Rectal NOW  . [START ON 12/24/2015] aspirin EC  325 mg Oral Daily  . metoprolol tartrate  12.5 mg Oral BID  . [START ON 12/24/2015] pravastatin  20 mg Oral Daily  . sodium chloride flush  3 mL Intravenous Q12H  . [START ON 12/24/2015] ticagrelor  90 mg Oral BID   Infusions:  . sodium chloride    . bivalirudin (ANGIOMAX) infusion 5 mg/mL (Cath Lab,ACS,PCI indication)    . tirofiban      Assessment: Pharmacy consulted to dose/monitor bivalirudin drip in a 60 yo female status post PCI. Spoke with RN, bivalirudin currently infusing at 3.7 mL/hr (5 mg/mL concentration).   Plan:  Per RN, bivalirudin drip is to be stopped at 1815.  Orders entered to continue drip until 1815.   Janelli Welling G 12/23/2015,5:31 PM

## 2015-12-23 NOTE — Progress Notes (Signed)
Notified Dr. Saralyn Pilar about elevated troponin value of . MD acknowledged and gave no new orders since patient post cardiac cath with stent placement and no chest pain. Notified E Link MD, Dr. Ashby Dawes about low potassium of 3.2 (patient with initiate [place] Adult Electrolyte Replacement Protocol ordered). Dr. Ashby Dawes to order potassium replacement.

## 2015-12-24 ENCOUNTER — Inpatient Hospital Stay
Admit: 2015-12-24 | Discharge: 2015-12-24 | Disposition: A | Discharge status: Home / Self Care | Payer: Managed Care, Other (non HMO) | Attending: Cardiology | Admitting: Cardiology

## 2015-12-24 ENCOUNTER — Encounter: Payer: Self-pay | Admitting: Cardiology

## 2015-12-24 LAB — TROPONIN I: Troponin I: >65 ng/mL — ABNORMAL HIGH (ref <0.031)

## 2015-12-24 LAB — BASIC METABOLIC PANEL
Anion gap: 5 (ref 5–15)
BUN: 13 mg/dL (ref 6–20)
CHLORIDE: 107 mmol/L (ref 101–111)
CO2: 24 mmol/L (ref 22–32)
CREATININE: 0.86 mg/dL (ref 0.44–1.00)
Calcium: 8.3 mg/dL — ABNORMAL LOW (ref 8.9–10.3)
GFR calc Af Amer: >60 mL/min (ref >60)
GFR calc non Af Amer: >60 mL/min (ref >60)
GLUCOSE: 105 mg/dL — AB (ref 65–99)
Potassium: 4.2 mmol/L (ref 3.5–5.1)
Sodium: 136 mmol/L (ref 135–145)

## 2015-12-24 LAB — CBC
HCT: 32.5 % — ABNORMAL LOW (ref 35.0–47.0)
Hemoglobin: 11.1 g/dL — ABNORMAL LOW (ref 12.0–16.0)
MCH: 31.4 pg (ref 26.0–34.0)
MCHC: 34.3 g/dL (ref 32.0–36.0)
MCV: 91.7 fL (ref 80.0–100.0)
PLATELETS: 226 10*3/uL (ref 150–440)
RBC: 3.54 MIL/uL — AB (ref 3.80–5.20)
RDW: 13.6 % (ref 11.5–14.5)
WBC: 9.3 10*3/uL (ref 3.6–11.0)

## 2015-12-24 LAB — GLUCOSE, CAPILLARY: Glucose-Capillary: 83 mg/dL (ref 65–99)

## 2015-12-24 LAB — ECHOCARDIOGRAM COMPLETE
HEIGHTINCHES: 61 in
WEIGHTICAEL: 2469.15 [oz_av]

## 2015-12-24 LAB — APTT: aPTT: 26 seconds (ref 24–36)

## 2015-12-24 LAB — PROTIME-INR
INR: 1.06
Prothrombin Time: 14 seconds (ref 11.4–15.0)

## 2015-12-24 NOTE — Progress Notes (Signed)
Room air. NSR. Takes meds. Standby assist. A & O. Family at the bedside. Pt has no complaints of pain. Pt has no further concerns at this time.

## 2015-12-24 NOTE — Progress Notes (Signed)
[facility] Cardiology  SUBJECTIVE: I don't have chest pain   Filed Vitals:   12/24/15 0500 12/24/15 0600 12/24/15 0700 12/24/15 0800  BP: 73/44 93/59 96/79  91/63  Pulse: 57 58 65 63  Temp:   98.4 F (36.9 C)   TempSrc:      Resp: 18 17 13 25   Height:      Weight:      SpO2: 97% 98% 97% 98%     Intake/Output Summary (Last 24 hours) at 12/24/15 0912 Last data filed at 12/23/15 2229  Gross per 24 hour  Intake  52.96 ml  Output    950 ml  Net -897.04 ml      PHYSICAL EXAM  General: Well developed, well nourished, in no acute distress HEENT:  Normocephalic and atramatic Neck:  No JVD.  Lungs: Clear bilaterally to auscultation and percussion. Heart: HRRR . Normal S1 and S2 without gallops or murmurs.  Abdomen: Bowel sounds are positive, abdomen soft and non-tender  Msk:  Back normal, normal gait. Normal strength and tone for age. Extremities: No clubbing, cyanosis or edema.   Neuro: Alert and oriented X 3. Psych:  Good affect, responds appropriately   LABS: Basic Metabolic Panel:  Recent Labs  12/23/15 1514 12/24/15 0357  NA 135 136  K 3.2* 4.2  CL 106 107  CO2 23 24  GLUCOSE 114* 105*  BUN 19 13  CREATININE 0.89 0.86  CALCIUM 8.0* 8.3*  MG 1.9  --    Liver Function Tests:  Recent Labs  12/23/15 1514  AST 23  ALT 14  ALKPHOS 67  BILITOT 0.5  PROT 6.7  ALBUMIN 3.3*   No results for input(s): LIPASE, AMYLASE in the last 72 hours. CBC:  Recent Labs  12/23/15 1514 12/23/15 2255 12/24/15 0357  WBC 9.2  --  9.3  HGB 12.2  --  11.1*  HCT 36.1  --  32.5*  MCV 93.4  --  91.7  PLT 241 219 226   Cardiac Enzymes:  Recent Labs  12/23/15 1715 12/23/15 2255 12/24/15 0357  TROPONINI 24.50* >65.00* >65.00*   BNP: Invalid input(s): POCBNP D-Dimer: No results for input(s): DDIMER in the last 72 hours. Hemoglobin A1C: No results for input(s): HGBA1C in the last 72 hours. Fasting Lipid Panel: No results for input(s): CHOL, HDL, LDLCALC, TRIG,  CHOLHDL, LDLDIRECT in the last 72 hours. Thyroid Function Tests: No results for input(s): TSH, T4TOTAL, T3FREE, THYROIDAB in the last 72 hours.  Invalid input(s): FREET3 Anemia Panel: No results for input(s): VITAMINB12, FOLATE, FERRITIN, TIBC, IRON, RETICCTPCT in the last 72 hours.  No results found.   Echo   TELEMETRY: Normal sinus rhythm:  ASSESSMENT AND PLAN:  Active Problems:   ST elevation myocardial infarction (STEMI) of inferior wall, initial episode of care ([place])    1. Anterior STEMI, drug-eluting stent mid LAD, currently no chest pain 2. Hyperlipidemia  Recommendations  1. Transfer to telemetry 2. 2-D echocardiogram 3. Increase activity     Lavi Sheehan, MD, PhD, [facility] 12/24/2015 9:12 AM

## 2015-12-24 NOTE — Care Management (Signed)
Presented with sx concerning for stemi.  Emergent cardiac cath with PCI. Transferred out of icu today.  To discharge hoe on Brilinta.  Updated [employer] of discharge planning needs of coverage for Brilinta and cardiac rehab

## 2015-12-24 NOTE — Progress Notes (Signed)
*  PRELIMINARY RESULTS* Echocardiogram 2D Echocardiogram has been performed.  Leah Espinoza 12/24/2015, 2:15 PM

## 2015-12-24 NOTE — Progress Notes (Signed)
Paged Dr. Saralyn Pilar regarding giving Leah Espinoza Lunch this morning since patient PT/INR was elevated yesterday. Ordered to still give Brilinta this morning and order for a recheck of PT/ INR and APTT.

## 2015-12-24 NOTE — Progress Notes (Signed)
Leah Espinoza is alert and oriented. VSS. No c/o chest pain or lightheadedness. Up on side of bed now with no difficulty. Family at bedside. Given PRN zofran for nausea. Pt now has orders to transfer to room 237. Will call nurse to give report

## 2015-12-25 LAB — CBC
HCT: 33 % — ABNORMAL LOW (ref 35.0–47.0)
Hemoglobin: 11.3 g/dL — ABNORMAL LOW (ref 12.0–16.0)
MCH: 32 pg (ref 26.0–34.0)
MCHC: 34.3 g/dL (ref 32.0–36.0)
MCV: 93.5 fL (ref 80.0–100.0)
PLATELETS: 207 10*3/uL (ref 150–440)
RBC: 3.53 MIL/uL — ABNORMAL LOW (ref 3.80–5.20)
RDW: 13.4 % (ref 11.5–14.5)
WBC: 8.1 10*3/uL (ref 3.6–11.0)

## 2015-12-25 LAB — BASIC METABOLIC PANEL
Anion gap: 6 (ref 5–15)
BUN: 12 mg/dL (ref 6–20)
CALCIUM: 8.3 mg/dL — AB (ref 8.9–10.3)
CHLORIDE: 106 mmol/L (ref 101–111)
CO2: 25 mmol/L (ref 22–32)
CREATININE: 0.92 mg/dL (ref 0.44–1.00)
GFR calc non Af Amer: >60 mL/min (ref >60)
Glucose, Bld: 112 mg/dL — ABNORMAL HIGH (ref 65–99)
Potassium: 3.8 mmol/L (ref 3.5–5.1)
SODIUM: 137 mmol/L (ref 135–145)

## 2015-12-25 MED ORDER — PRAVASTATIN SODIUM 20 MG PO TABS: 20.0000 mg | ORAL_TABLET | Freq: Every day | ORAL | Status: DC

## 2015-12-25 MED ORDER — METOPROLOL TARTRATE 25 MG PO TABS: 12.5000 mg | ORAL_TABLET | Freq: Two times a day (BID) | ORAL | Status: DC

## 2015-12-25 MED ORDER — TICAGRELOR 90 MG PO TABS: 90.0000 mg | ORAL_TABLET | Freq: Two times a day (BID) | ORAL | Status: DC

## 2015-12-25 MED ORDER — ONDANSETRON HCL 4 MG/2ML IJ SOLN: 4.0000 mg | Freq: Four times a day (QID) | INTRAMUSCULAR | Status: DC | PRN

## 2015-12-25 MED ORDER — ASPIRIN 81 MG PO TBEC: 81.0000 mg | DELAYED_RELEASE_TABLET | Freq: Every day | ORAL | Status: DC

## 2015-12-25 NOTE — Plan of Care (Signed)
Problem: Phase I Progression Outcomes Goal: Vascular site scale level 0 - I Vascular Site Scale Level 0: No bruising/bleeding/hematoma Level I (Mild): Bruising/Ecchymosis, minimal bleeding/ooozing, palpable hematoma < 3 cm Level II (Moderate): Bleeding not affecting hemodynamic parameters, pseudoaneurysm, palpable hematoma > 3 cm Level III (Severe) Bleeding which affects hemodynamic parameters or retroperitoneal hemorrhage  Outcome: Progressing Right groin at Level 0 although pt reports tenderness to touch.  PPP.

## 2015-12-25 NOTE — Discharge Summary (Signed)
   Physician Discharge Summary  Patient ID: RAHEEMA JAQUEZ MRN: [id number] DOB/AGE: August 15, 1956 60 y.o.  Admit date: 12/23/2015 Discharge date: 12/25/2015  Primary Discharge Diagnosis Anterior STEMI Secondary Discharge Diagnosis Coronary artery disease  Significant Diagnostic Studies: Cardiac catheterization  Consults: None  Hospital Course: The patient is a 57-year-old female who presented to [facility] emergency room with prolonged chest pain, and ECG diagnostic for anterolateral ST elevation myocardial infarction. The patient underwent cardiac catheterization which revealed occluded proximal LAD, 75% stenosis mid left circumflex. The patient underwent primary PCI, receiving a 2.75 x 23 mm I answered drug-eluting stent in the proximal LAD with an excellent angiographic result. The patient was admitted to the ICU where troponin was greater than 65.0. The patient had no recurrent chest pain. She was transferred to telemetry on 12/24/2015 where she ambulated without difficulty.   Discharge Exam: Blood pressure 94/56, pulse 67, temperature 98.5 F (36.9 C), temperature source Oral, resp. rate 16, height 5' (1.524 m), weight 68.856 kg (151 lb 12.8 oz), SpO2 96 %.   Head: Normocephalic, without obvious abnormality, atraumatic Eyes: conjunctivae/corneas clear. PERRL, EOM's intact. Fundi benign. Ears: normal TM's and external ear canals both ears Labs:   Lab Results  Component Value Date   WBC 8.1 12/25/2015   HGB 11.3* 12/25/2015   HCT 33.0* 12/25/2015   MCV 93.5 12/25/2015   PLT 207 12/25/2015    Recent Labs Lab 12/23/15 1514  12/25/15 0420  NA 135  < > 137  K 3.2*  < > 3.8  CL 106  < > 106  CO2 23  < > 25  BUN 19  < > 12  CREATININE 0.89  < > 0.92  CALCIUM 8.0*  < > 8.3*  PROT 6.7  --   --   BILITOT 0.5  --   --   ALKPHOS 67  --   --   ALT 14  --   --   AST 23  --   --   GLUCOSE 114*  < > 112*  < > = values in this interval not displayed.    Radiology:  EKG: Normal sinus  rhythm with ST elevation in leads 1, aVL and V2  FOLLOW UP PLANS AND APPOINTMENTS Discharge Instructions    AMB Referral to Cardiac Rehabilitation - Phase II    Complete by:  As directed   Diagnosis:   STEMI Coronary Stents              Medication List    ASK your doctor about these medications        pravastatin 20 MG tablet  Commonly known as:  PRAVACHOL  Take 20 mg by mouth daily.         BRING ALL MEDICATIONS WITH YOU TO FOLLOW UP APPOINTMENTS  Time spent with patient to include physician time: 25 min Signed:  Kathlen Sakurai MD, PhD, [facility] 12/25/2015, 8:00 AM

## 2015-12-25 NOTE — Care Management Note (Signed)
Case Management Note  Patient Details  Name: Leah Espinoza MRN: [id number] Date of Birth: 11-25-1955  Subjective/Objective:  Admitted with STEMI and cardiac catherization. Met with patient at bedside to discuss discharge planning needs. She is ambulatory, independent, drives and works. Lives at home with her spouse. She is being discharged on Brilinta. Coupon given. Denies issues accessing medical care, transportation or copays. No additional needs identified. Case Closed.                 Action/Plan:   Expected Discharge Date:   12/25/2015               Expected Discharge Plan:  Home/Self Care  In-House Referral:     Discharge planning Services  CM Consult  Post Acute Care Choice:    Choice offered to:     DME Arranged:    DME Agency:     HH Arranged:    HH Agency:     Status of Service:  Completed, signed off  Medicare Important Message Given:    Date Medicare IM Given:    Medicare IM give by:    Date Additional Medicare IM Given:    Additional Medicare Important Message give by:     If discussed at [place] of Stay Meetings, dates discussed:    Additional Comments:  Jolly Mango, RN 12/25/2015, 8:44 AM

## 2015-12-25 NOTE — H&P (Signed)
[facility] Cardiology History and Physical  Patient ID: Leah Espinoza MRN: [id number] DOB/AGE: May 12, 1956 60 y.o. Admit date: 12/23/2015  Primary Care Physician: Einar Pheasant, MD Primary Cardiologist   HPI: The patient is a 60 year old female who presents with acute chest pain and ECG consistent with ST elevation myocardial infarction. The patient has a one-week history of mild intermittent chest discomfort. She presented to [facility] emergency room on 12/23/2015 with prolonged episode of 6 out of 10 substernal chest pain. ECG revealed ST elevations in leads 1 ,aVL and V2 with reciprocal ST depressions inferiorly.    Past Medical History  Diagnosis Date  . History of chicken pox   . History of fainting   . Hyperlipidemia   . Hypertension     Past Surgical History  Procedure Laterality Date  . Cardiac catheterization N/A 12/23/2015    Procedure: Left Heart Cath and Coronary Angiography;  Surgeon: Isaias Cowman, MD;  Location: [place] CV LAB;  Service: Cardiovascular;  Laterality: N/A;  . Cardiac catheterization N/A 12/23/2015    Procedure: Coronary Stent Intervention;  Surgeon: Isaias Cowman, MD;  Location: [place] CV [facility];  Service: Cardiovascular;  Laterality: N/A;    Prescriptions prior to admission  Medication Sig Dispense Refill Last Dose  . pravastatin (PRAVACHOL) 20 MG tablet Take 20 mg by mouth daily.   unknown at unknown    Social History   Social History  . Marital Status: Married    Spouse Name: N/A  . Number of Children: N/A  . Years of Education: N/A   Occupational History  . Not on file.   Social History Main Topics  . Smoking status: Former Research scientist (life sciences)  . Smokeless tobacco: Never Used  . Alcohol Use: 0.0 oz/week    0 Standard drinks or equivalent per week  . Drug Use: No  . Sexual Activity: Not on file   Other Topics Concern  . Not on file   Social History Narrative    Family History  Problem Relation Age of Onset  . Hyperlipidemia Mother    . Heart disease Mother   . Cancer Father     lung  . Sudden death Brother   . Arthritis Maternal Grandmother       Review of systems complete and found to be negative unless listed above      Physical Exam:  General: Well developed, well nourished, in no acute distress HEENT:  Normocephalic and atramatic Neck:  No JVD.  Lungs: Clear bilaterally to auscultation and percussion. Heart: HRRR . Normal S1 and S2 without gallops or murmurs.  Abdomen: Bowel sounds are positive, abdomen soft and non-tender  Msk:  Back normal, normal gait. Normal strength and tone for age. Extremities: No clubbing, cyanosis or edema.   Neuro: Alert and oriented X 3. Psych:  Good affect, responds appropriately   Labs:   Lab Results  Component Value Date   WBC 8.1 12/25/2015   HGB 11.3* 12/25/2015   HCT 33.0* 12/25/2015   MCV 93.5 12/25/2015   PLT 207 12/25/2015    Recent Labs Lab 12/23/15 1514  12/25/15 0420  NA 135  < > 137  K 3.2*  < > 3.8  CL 106  < > 106  CO2 23  < > 25  BUN 19  < > 12  CREATININE 0.89  < > 0.92  CALCIUM 8.0*  < > 8.3*  PROT 6.7  --   --   BILITOT 0.5  --   --   ALKPHOS 67  --   --  ALT 14  --   --   AST 23  --   --   GLUCOSE 114*  < > 112*  < > = values in this interval not displayed. Lab Results  Component Value Date   TROPONINI >65.00* 12/24/2015    Lab Results  Component Value Date   CHOL 284* 12/09/2015   CHOL 288* 04/04/2015   CHOL 244* 11/27/2014   Lab Results  Component Value Date   HDL 54 12/09/2015   HDL 60 04/04/2015   HDL 47 11/27/2014   Lab Results  Component Value Date   LDLCALC 212* 12/09/2015   LDLCALC 208 04/04/2015   LDLCALC 172 11/27/2014   Lab Results  Component Value Date   TRIG 89 12/09/2015   TRIG 101 04/04/2015   TRIG 127 11/27/2014   No results found for: CHOLHDL No results found for: LDLDIRECT    Radiology: No results found.  EKG: Normal sinus rhythm, ST elevations in leads 1, aVL, and V2  ASSESSMENT AND  PLAN:   1. Anterolateral ST elevation myocardial infarction  Recommendations  1. Cardiac catheterization with probable primary PCI  Signed: Mishel Sans MD,PhD, [facility] 12/25/2015, 7:57 AM

## 2015-12-25 NOTE — Progress Notes (Signed)
Pt discharged to home via wc.  Instructions and rx given to pt.  Questions answered.  No distress.  

## 2016-01-22 ENCOUNTER — Encounter: Payer: Managed Care, Other (non HMO) | Attending: Cardiology | Admitting: *Deleted

## 2016-01-22 VITALS — Ht 61.2 in | Wt 146.2 lb

## 2016-01-22 DIAGNOSIS — Z955 Presence of coronary angioplasty implant and graft: Secondary | ICD-10-CM | POA: Diagnosis not present

## 2016-01-22 NOTE — Progress Notes (Signed)
Daily Session Note  Patient Details  Name: Leah Espinoza MRN: [id number] Date of Birth: [birth year] Referring Provider:    Encounter Date: 01/22/2016  Check In:     Session Check In - 01/22/16 1306    Check-In   Location [facility]   Staff Present Gerlene Burdock, RN, BSN   Supervising physician immediately available to respond to emergencies See telemetry face sheet for immediately available ER MD   Medication changes reported     No   Fall or balance concerns reported    No   Warm-up and Cool-down Performed on first and last piece of equipment   VAD Patient? No   Pain Assessment   Currently in Pain? No/denies         Goals Met:  Personal goals reviewed  Goals Unmet:  Not Applicable  Comments: Ready to start Cardiac Rehab. Orientation/Medical Review appt done today.    Dr. Emily Filbert is Medical Director for [place] and [facility].

## 2016-01-22 NOTE — Progress Notes (Signed)
Cardiac Individual Treatment Plan  Patient Details  Name: Leah Espinoza MRN: [id number] Date of Birth: [birth year] Referring Provider:  Paraschos  Initial Encounter Date: 01/22/2016  Visit Diagnosis: S/P coronary artery stent placement  Patient's Home Medications on Admission:  Current outpatient prescriptions:  .  aspirin EC 81 MG EC tablet, Take 1 tablet (81 mg total) by mouth daily., Disp: 30 tablet, Rfl: 5 .  metoprolol tartrate (LOPRESSOR) 25 MG tablet, Take 0.5 tablets (12.5 mg total) by mouth 2 (two) times daily., Disp: 60 tablet, Rfl: 5 .  ondansetron (ZOFRAN) 4 MG/2ML SOLN injection, Inject 2 mLs (4 mg total) into the vein every 6 (six) hours as needed for nausea., Disp: 2 mL, Rfl: 0 .  pravastatin (PRAVACHOL) 20 MG tablet, Take 20 mg by mouth daily., Disp: , Rfl:  .  pravastatin (PRAVACHOL) 20 MG tablet, Take 1 tablet (20 mg total) by mouth daily., Disp: 30 tablet, Rfl: 5 .  ticagrelor (BRILINTA) 90 MG TABS tablet, Take 1 tablet (90 mg total) by mouth 2 (two) times daily., Disp: 60 tablet, Rfl: 5  Past Medical History: Past Medical History  Diagnosis Date  . History of chicken pox   . History of fainting   . Hyperlipidemia   . Hypertension     Tobacco Use: History  Smoking status  . Former Smoker  Smokeless tobacco  . Never Used    Labs: Recent [employer] for ITP Cardiac and Pulmonary Rehab Latest Ref Rng 11/27/2014 04/04/2015 12/09/2015   Cholestrol 100 - 199 mg/dL 244(A) 288(A) 284(H)   LDLCALC 0 - 99 mg/dL 172 208 212(H)   HDL >39 mg/dL 47 60 54   Trlycerides 0 - 149 mg/dL 127 101 89       Exercise Target Goals:    Exercise Program Goal: Individual exercise prescription set with THRR, safety & activity barriers. Participant demonstrates ability to understand and report RPE using BORG scale, to self-measure pulse accurately, and to acknowledge the importance of the exercise prescription.  Exercise Prescription Goal: Starting with  aerobic activity 30 plus minutes a day, 3 days per week for initial exercise prescription. Provide home exercise prescription and guidelines that participant acknowledges understanding prior to discharge.  Activity Barriers & Risk Stratification:     Activity Barriers & Cardiac Risk Stratification - 01/22/16 1420    Activity Barriers & Cardiac Risk Stratification   Activity Barriers None   Cardiac Risk Stratification High      6 Minute Walk:   Initial Exercise Prescription:   Perform Capillary Blood Glucose checks as needed.  Exercise Prescription Changes:   Exercise Comments:   Discharge Exercise Prescription (Final Exercise Prescription Changes):   Nutrition:  Target Goals: Understanding of nutrition guidelines, daily intake of sodium 1500mg , cholesterol 200mg , calories 30% from fat and 7% or less from saturated fats, daily to have 5 or more servings of fruits and vegetables.  Biometrics:    Nutrition Therapy Plan and Nutrition Goals:     Nutrition Therapy & Goals - 01/22/16 1421    Nutrition Therapy   Drug/Food Interactions Statins/Certain Fruits   Intervention Plan   Intervention Prescribe, educate and counsel regarding individualized specific dietary modifications aiming towards targeted core components such as weight, hypertension, lipid management, diabetes, heart failure and other comorbidities.;Nutrition handout(s) given to patient.   Expected Outcomes Short Term Goal: Understand basic principles of dietary content, such as calories, fat, sodium, cholesterol and nutrients.;Long Term Goal: Adherence to prescribed nutrition plan.;Short Term Goal:  A plan has been developed with personal nutrition goals set during dietitian appointment.      Nutrition Discharge: Rate Your Plate Scores:   Nutrition Goals Re-Evaluation:   Psychosocial: Target Goals: Acknowledge presence or absence of depression, maximize coping skills, provide positive support system.  Participant is able to verbalize types and ability to use techniques and skills needed for reducing stress and depression.  Initial Review & Psychosocial Screening:     Initial Psych Review & Screening - 01/22/16 1423    Initial Review   Current issues with Current Stress Concerns   Family Dynamics   Good Support System? No   Barriers   Psychosocial barriers to participate in program The patient should benefit from training in stress management and relaxation.   Screening Interventions   Interventions Encouraged to exercise      Quality of Life Scores:     Quality of Life - 01/22/16 1418    Quality of Life Scores   Health/Function Pre 17.6 %   Socioeconomic Pre 24.57 %   Psych/Spiritual Pre 18.71 %   Family Pre 20.1 %   GLOBAL Pre 19.63 %      PHQ-9:     Recent Review Flowsheet Data    Depression screen [facility] 2/9 01/22/2016 04/08/2015 11/14/2013   Decreased Interest 0 0 0   Down, Depressed, Hopeless 0 0 0   PHQ - 2 Score 0 0 0   Altered sleeping 1 - -   Tired, decreased energy 2 - -   Change in appetite 1 - -   Feeling bad or failure about yourself  0 - -   Trouble concentrating 0 - -   Moving slowly or fidgety/restless 0 - -   Suicidal thoughts 0 - -   PHQ-9 Score 4 - -   Difficult doing work/chores Not difficult at all - -      Psychosocial Evaluation and Intervention:   Psychosocial Re-Evaluation:   Vocational Rehabilitation: Provide vocational rehab assistance to qualifying candidates.   Vocational Rehab Evaluation & Intervention:     Vocational Rehab - 01/22/16 1420    Initial Vocational Rehab Evaluation & Intervention   Assessment shows need for Vocational Rehabilitation No      Education: Education Goals: Education classes will be provided on a weekly basis, covering required topics. Participant will state understanding/return demonstration of topics presented.  Learning Barriers/Preferences:     Learning Barriers/Preferences - 01/22/16 1420     Learning Barriers/Preferences   Learning Barriers None   Learning Preferences None      Education Topics: General Nutrition Guidelines/Fats and Fiber: -Group instruction provided by verbal, written material, models and posters to present the general guidelines for heart healthy nutrition. Gives an explanation and review of dietary fats and fiber.   Controlling Sodium/Reading Food Labels: -Group verbal and written material supporting the discussion of sodium use in heart healthy nutrition. Review and explanation with models, verbal and written materials for utilization of the food label.   Exercise Physiology & Risk Factors: - Group verbal and written instruction with models to review the exercise physiology of the cardiovascular system and associated critical values. Details cardiovascular disease risk factors and the goals associated with each risk factor.   Aerobic Exercise & Resistance Training: - Gives group verbal and written discussion on the health impact of inactivity. On the components of aerobic and resistive training programs and the benefits of this training and how to safely progress through these programs.   Flexibility, Balance, General  Exercise Guidelines: - Provides group verbal and written instruction on the benefits of flexibility and balance training programs. Provides general exercise guidelines with specific guidelines to those with heart or lung disease. Demonstration and skill practice provided.   Stress Management: - Provides group verbal and written instruction about the health risks of elevated stress, cause of high stress, and healthy ways to reduce stress.   Depression: - Provides group verbal and written instruction on the correlation between heart/lung disease and depressed mood, treatment options, and the stigmas associated with seeking treatment.   Anatomy & Physiology of the Heart: - Group verbal and written instruction and models provide basic  cardiac anatomy and physiology, with the coronary electrical and arterial systems. Review of: AMI, Angina, Valve disease, Heart Failure, Cardiac Arrhythmia, Pacemakers, and the ICD.   Cardiac Procedures: - Group verbal and written instruction and models to describe the testing methods done to diagnose heart disease. Reviews the outcomes of the test results. Describes the treatment choices: Medical Management, Angioplasty, or Coronary Bypass Surgery.   Cardiac Medications: - Group verbal and written instruction to review commonly prescribed medications for heart disease. Reviews the medication, class of the drug, and side effects. Includes the steps to properly store meds and maintain the prescription regimen.   Go Sex-Intimacy & Heart Disease, Get SMART - Goal Setting: - Group verbal and written instruction through game format to discuss heart disease and the return to sexual intimacy. Provides group verbal and written material to discuss and apply goal setting through the application of the S.M.A.R.T. Method.   Other Matters of the Heart: - Provides group verbal, written materials and models to describe Heart Failure, Angina, Valve Disease, and Diabetes in the realm of heart disease. Includes description of the disease process and treatment options available to the cardiac patient.   Exercise & Equipment Safety: - Individual verbal instruction and demonstration of equipment use and safety with use of the equipment.          Cardiac Rehab from 01/22/2016 in [facility] Cardiac and Pulmonary Rehab   Date  01/22/16   Educator  C. Sabatino Williard RN   Instruction Review Code  1- partially meets, needs review/practice      Infection Prevention: - Provides verbal and written material to individual with discussion of infection control including proper hand washing and proper equipment cleaning during exercise session.      Cardiac Rehab from 01/22/2016 in [facility] Cardiac and Pulmonary Rehab   Date  01/22/16    Educator  C. EnterkinRN   Instruction Review Code  2- meets goals/outcomes      Falls Prevention: - Provides verbal and written material to individual with discussion of falls prevention and safety.      Cardiac Rehab from 01/22/2016 in [facility] Cardiac and Pulmonary Rehab   Date  01/22/16   Educator  C. Braddock   Instruction Review Code  2- meets goals/outcomes      Diabetes: - Individual verbal and written instruction to review signs/symptoms of diabetes, desired ranges of glucose level fasting, after meals and with exercise. Advice that pre and post exercise glucose checks will be done for 3 sessions at entry of program.    Knowledge Questionnaire Score:     Knowledge Questionnaire Score - 01/22/16 1420    Knowledge Questionnaire Score   Pre Score 25      Core Components/Risk Factors/Patient Goals at Admission:     Personal Goals and Risk Factors at Admission - 01/22/16 1421  Core Components/Risk Factors/Patient Goals on Admission   Sedentary Yes   Intervention Provide advice, education, support and counseling about physical activity/exercise needs.;Develop an individualized exercise prescription for aerobic and resistive training based on initial evaluation findings, risk stratification, comorbidities and participant's personal goals.   Expected Outcomes Achievement of increased cardiorespiratory fitness and enhanced flexibility, muscular endurance and strength shown through measurements of functional capacity and personal statement of participant.   Hypertension Yes   Intervention Provide education on lifestyle modifcations including regular physical activity/exercise, weight management, moderate sodium restriction and increased consumption of fresh fruit, vegetables, and low fat dairy, alcohol moderation, and smoking cessation.;Monitor prescription use compliance.   Expected Outcomes Short Term: Continued assessment and intervention until BP is < 140/50mm HG in  hypertensive participants. < 130/79mm HG in hypertensive participants with diabetes, heart failure or chronic kidney disease.;Long Term: Maintenance of blood pressure at goal levels.   Lipids Yes   Intervention Provide education and support for participant on nutrition & aerobic/resistive exercise along with prescribed medications to achieve LDL 70mg , HDL >40mg .   Expected Outcomes Short Term: Participant states understanding of desired cholesterol values and is compliant with medications prescribed. Participant is following exercise prescription and nutrition guidelines.;Long Term: Cholesterol controlled with medications as prescribed, with individualized exercise RX and with personalized nutrition plan. Value goals: LDL < 70mg , HDL > 40 mg.   Stress Yes   Intervention Offer individual and/or small group education and counseling on adjustment to heart disease, stress management and health-related lifestyle change. Teach and support self-help strategies.;Refer participants experiencing significant psychosocial distress to appropriate mental health specialists for further evaluation and treatment. When possible, include family members and significant others in education/counseling sessions.   Expected Outcomes Short Term: Participant demonstrates changes in health-related behavior, relaxation and other stress management skills, ability to obtain effective social support, and compliance with psychotropic medications if prescribed.;Long Term: Emotional wellbeing is indicated by absence of clinically significant psychosocial distress or social isolation.      Core Components/Risk Factors/Patient Goals Review:    Core Components/Risk Factors/Patient Goals at Discharge (Final Review):    ITP Comments:   Comments: Ready to start exercising in Cardiac Rehab.

## 2016-01-22 NOTE — Patient Instructions (Signed)
Patient Instructions  Patient Details  Name: Leah Espinoza MRN: [id number] Date of Birth: [birth year] Referring Provider:  Isaias Cowman, MD  Below are the personal goals you chose as well as exercise and nutrition goals. Our goal is to help you keep on track towards obtaining and maintaining your goals. We will be discussing your progress on these goals with you throughout the program.  Initial Exercise Prescription:     Initial Exercise Prescription - 01/22/16 1400    Date of Initial Exercise RX and Referring Provider   Date 01/22/16   Treadmill   MPH 2   Grade 0   Minutes 15   Recumbant Bike   Level 2   Watts 20   Minutes 15   NuStep   Level 2   Watts 40   Minutes 15   Cybex   Level 2   RPM 50   Minutes 15   Recumbant Elliptical   Level 1   Watts 10   Minutes 15   Elliptical   Level 1   Speed 3   Minutes 5   REL-XR   Level 2   Watts 50   Minutes 15   T5 Nustep   Level 2   Watts 40   Minutes 15   Biostep-RELP   Level 2   Watts 25   Minutes 15   Prescription Details   Frequency (times per week) 3   Duration Progress to 50 minutes of aerobic without signs/symptoms of physical distress   Intensity   THRR REST +  30   Ratings of Perceived Exertion 11-13   Perceived Dyspnea 0-4   Progression   Progression Continue to progress workloads to maintain intensity without signs/symptoms of physical distress.   Resistance Training   Training Prescription Yes   Weight 2   Reps 10-15      Exercise Goals: Frequency: Be able to perform aerobic exercise three times per week working toward 3-5 days per week.  Intensity: Work with a perceived exertion of 11 (fairly light) - 15 (hard) as tolerated. Follow your new exercise prescription and watch for changes in prescription as you progress with the program. Changes will be reviewed with you when they are made.  Duration: You should be able to do 30 minutes of continuous aerobic exercise in addition to a 5  minute warm-up and a 5 minute cool-down routine.  Nutrition Goals: Your personal nutrition goals will be established when you do your nutrition analysis with the dietician.  The following are nutrition guidelines to follow: Cholesterol < 200mg /day Sodium < 1500mg /day Fiber: Women over 50 yrs - 21 grams per day  Personal Goals:     Personal Goals and Risk Factors at Admission - 01/22/16 1421    Core Components/Risk Factors/Patient Goals on Admission   Sedentary Yes   Intervention Provide advice, education, support and counseling about physical activity/exercise needs.;Develop an individualized exercise prescription for aerobic and resistive training based on initial evaluation findings, risk stratification, comorbidities and participant's personal goals.   Expected Outcomes Achievement of increased cardiorespiratory fitness and enhanced flexibility, muscular endurance and strength shown through measurements of functional capacity and personal statement of participant.   Hypertension Yes   Intervention Provide education on lifestyle modifcations including regular physical activity/exercise, weight management, moderate sodium restriction and increased consumption of fresh fruit, vegetables, and low fat dairy, alcohol moderation, and smoking cessation.;Monitor prescription use compliance.   Expected Outcomes Short Term: Continued assessment and intervention until BP is < 140/31mm HG  in hypertensive participants. < 130/45mm HG in hypertensive participants with diabetes, heart failure or chronic kidney disease.;Long Term: Maintenance of blood pressure at goal levels.   Lipids Yes   Intervention Provide education and support for participant on nutrition & aerobic/resistive exercise along with prescribed medications to achieve LDL 70mg , HDL >40mg .   Expected Outcomes Short Term: Participant states understanding of desired cholesterol values and is compliant with medications prescribed. Participant is  following exercise prescription and nutrition guidelines.;Long Term: Cholesterol controlled with medications as prescribed, with individualized exercise RX and with personalized nutrition plan. Value goals: LDL < 70mg , HDL > 40 mg.   Stress Yes   Intervention Offer individual and/or small group education and counseling on adjustment to heart disease, stress management and health-related lifestyle change. Teach and support self-help strategies.;Refer participants experiencing significant psychosocial distress to appropriate mental health specialists for further evaluation and treatment. When possible, include family members and significant others in education/counseling sessions.   Expected Outcomes Short Term: Participant demonstrates changes in health-related behavior, relaxation and other stress management skills, ability to obtain effective social support, and compliance with psychotropic medications if prescribed.;Long Term: Emotional wellbeing is indicated by absence of clinically significant psychosocial distress or social isolation.      Tobacco Use Initial Evaluation: History  Smoking status  . Former Smoker  Smokeless tobacco  . Never Used    Copy of goals given to participant.

## 2016-01-22 NOTE — Progress Notes (Signed)
Cardiac Individual Treatment Plan  Patient Details  Name: Leah Espinoza MRN: [id number] Date of Birth: 1956/06/17 Referring Provider:  Dr. Saralyn Pilar  Initial Encounter Date: 01/22/2016      Cardiac Rehab from 01/22/2016 in [facility] Cardiac and Pulmonary Rehab   Date  01/22/16      Visit Diagnosis: S/P coronary artery stent placement - Plan: CARDIAC REHAB 30 DAY REVIEW  Patient's Home Medications on Admission:  Current outpatient prescriptions:  .  aspirin EC 81 MG EC tablet, Take 1 tablet (81 mg total) by mouth daily., Disp: 30 tablet, Rfl: 5 .  metoprolol tartrate (LOPRESSOR) 25 MG tablet, Take 0.5 tablets (12.5 mg total) by mouth 2 (two) times daily., Disp: 60 tablet, Rfl: 5 .  ondansetron (ZOFRAN) 4 MG/2ML SOLN injection, Inject 2 mLs (4 mg total) into the vein every 6 (six) hours as needed for nausea., Disp: 2 mL, Rfl: 0 .  pravastatin (PRAVACHOL) 20 MG tablet, Take 20 mg by mouth daily., Disp: , Rfl:  .  pravastatin (PRAVACHOL) 20 MG tablet, Take 1 tablet (20 mg total) by mouth daily., Disp: 30 tablet, Rfl: 5 .  ticagrelor (BRILINTA) 90 MG TABS tablet, Take 1 tablet (90 mg total) by mouth 2 (two) times daily., Disp: 60 tablet, Rfl: 5  Past Medical History: Past Medical History  Diagnosis Date  . History of chicken pox   . History of fainting   . Hyperlipidemia   . Hypertension     Tobacco Use: History  Smoking status  . Former Smoker  Smokeless tobacco  . Never Used    Labs: Recent [employer] for ITP Cardiac and Pulmonary Rehab Latest Ref Rng 11/27/2014 04/04/2015 12/09/2015   Cholestrol 100 - 199 mg/dL 244(A) 288(A) 284(H)   LDLCALC 0 - 99 mg/dL 172 208 212(H)   HDL >39 mg/dL 47 60 54   Trlycerides 0 - 149 mg/dL 127 101 89       Exercise Target Goals: Date: 01/22/16  Exercise Program Goal: Individual exercise prescription set with THRR, safety & activity barriers. Participant demonstrates ability to understand and report RPE using BORG scale,  to self-measure pulse accurately, and to acknowledge the importance of the exercise prescription.  Exercise Prescription Goal: Starting with aerobic activity 30 plus minutes a day, 3 days per week for initial exercise prescription. Provide home exercise prescription and guidelines that participant acknowledges understanding prior to discharge.  Activity Barriers & Risk Stratification:     Activity Barriers & Cardiac Risk Stratification - 01/22/16 1420    Activity Barriers & Cardiac Risk Stratification   Activity Barriers None   Cardiac Risk Stratification High      6 Minute Walk:     6 Minute Walk      01/22/16 1446       6 Minute Walk   Phase Initial     Distance 1400 feet     Walk Time 6 minutes     # of Rest Breaks 0     MPH 2.7     RPE 12     Symptoms No     Resting HR 59 bpm     Resting BP 122/62 mmHg     Max Ex. HR 99 bpm     Max Ex. BP 132/70 mmHg        Initial Exercise Prescription:     Initial Exercise Prescription - 01/22/16 1400    Date of Initial Exercise RX and Referring Provider   Date 01/22/16  Treadmill   MPH 2   Grade 0   Minutes 15   Recumbant Bike   Level 2   Watts 20   Minutes 15   NuStep   Level 2   Watts 40   Minutes 15   Cybex   Level 2   RPM 50   Minutes 15   Recumbant Elliptical   Level 1   Watts 10   Minutes 15   Elliptical   Level 1   Speed 3   Minutes 5   REL-XR   Level 2   Watts 50   Minutes 15   T5 Nustep   Level 2   Watts 40   Minutes 15   Biostep-RELP   Level 2   Watts 25   Minutes 15   Prescription Details   Frequency (times per week) 3   Duration Progress to 50 minutes of aerobic without signs/symptoms of physical distress   Intensity   THRR REST +  30   Ratings of Perceived Exertion 11-13   Perceived Dyspnea 0-4   Progression   Progression Continue to progress workloads to maintain intensity without signs/symptoms of physical distress.   Resistance Training   Training Prescription Yes    Weight 2   Reps 10-15      Perform Capillary Blood Glucose checks as needed.  Exercise Prescription Changes:   Exercise Comments:     Exercise Comments      01/22/16 1452           Exercise Comments Initial Target heart rate will be rest + 30, monitoring for symptoms. (Demonstrates 75% blockage of circumflex and LAD arteries)          Discharge Exercise Prescription (Final Exercise Prescription Changes):   Nutrition:  Target Goals: Understanding of nutrition guidelines, daily intake of sodium 1500mg , cholesterol 200mg , calories 30% from fat and 7% or less from saturated fats, daily to have 5 or more servings of fruits and vegetables.  Biometrics:     Pre Biometrics - 01/22/16 1439    Pre Biometrics   Height 5' 1.2" (1.554 m)   Weight 146 lb 3.2 oz (66.316 kg)   Waist Circumference 33.5 inches   Hip Circumference 42.5 inches   Waist to Hip Ratio 0.79 %   BMI (Calculated) 27.5       Nutrition Therapy Plan and Nutrition Goals:     Nutrition Therapy & Goals - 01/22/16 1532    Personal Nutrition Goals   Comments Leah Espinoza mentioned she has "irritable bowel".       Nutrition Discharge: Rate Your Plate Scores:   Nutrition Goals Re-Evaluation:   Psychosocial: Target Goals: Acknowledge presence or absence of depression, maximize coping skills, provide positive support system. Participant is able to verbalize types and ability to use techniques and skills needed for reducing stress and depression.  Initial Review & Psychosocial Screening:     Initial Psych Review & Screening - 01/22/16 1534    Initial Review   Comments Leah Espinoza mentioned she has "irritable bowel".       Quality of Life Scores:     Quality of Life - 01/22/16 1418    Quality of Life Scores   Health/Function Pre 17.6 %   Socioeconomic Pre 24.57 %   Psych/Spiritual Pre 18.71 %   Family Pre 20.1 %   GLOBAL Pre 19.63 %      PHQ-9:     Recent Review Flowsheet Data    Depression screen  [facility] 2/9 01/22/2016  04/08/2015 11/14/2013   Decreased Interest 0 0 0   Down, Depressed, Hopeless 0 0 0   PHQ - 2 Score 0 0 0   Altered sleeping 1 - -   Tired, decreased energy 2 - -   Change in appetite 1 - -   Feeling bad or failure about yourself  0 - -   Trouble concentrating 0 - -   Moving slowly or fidgety/restless 0 - -   Suicidal thoughts 0 - -   PHQ-9 Score 4 - -   Difficult doing work/chores Not difficult at all - -      Psychosocial Evaluation and Intervention:   Psychosocial Re-Evaluation:   Vocational Rehabilitation: Provide vocational rehab assistance to qualifying candidates.   Vocational Rehab Evaluation & Intervention:     Vocational Rehab - 01/22/16 1420    Initial Vocational Rehab Evaluation & Intervention   Assessment shows need for Vocational Rehabilitation No      Education: Education Goals: Education classes will be provided on a weekly basis, covering required topics. Participant will state understanding/return demonstration of topics presented.  Learning Barriers/Preferences:     Learning Barriers/Preferences - 01/22/16 1420    Learning Barriers/Preferences   Learning Barriers None   Learning Preferences None      Education Topics: General Nutrition Guidelines/Fats and Fiber: -Group instruction provided by verbal, written material, models and posters to present the general guidelines for heart healthy nutrition. Gives an explanation and review of dietary fats and fiber.   Controlling Sodium/Reading Food Labels: -Group verbal and written material supporting the discussion of sodium use in heart healthy nutrition. Review and explanation with models, verbal and written materials for utilization of the food label.   Exercise Physiology & Risk Factors: - Group verbal and written instruction with models to review the exercise physiology of the cardiovascular system and associated critical values. Details cardiovascular disease risk factors and the  goals associated with each risk factor.   Aerobic Exercise & Resistance Training: - Gives group verbal and written discussion on the health impact of inactivity. On the components of aerobic and resistive training programs and the benefits of this training and how to safely progress through these programs.   Flexibility, Balance, General Exercise Guidelines: - Provides group verbal and written instruction on the benefits of flexibility and balance training programs. Provides general exercise guidelines with specific guidelines to those with heart or lung disease. Demonstration and skill practice provided.   Stress Management: - Provides group verbal and written instruction about the health risks of elevated stress, cause of high stress, and healthy ways to reduce stress.   Depression: - Provides group verbal and written instruction on the correlation between heart/lung disease and depressed mood, treatment options, and the stigmas associated with seeking treatment.   Anatomy & Physiology of the Heart: - Group verbal and written instruction and models provide basic cardiac anatomy and physiology, with the coronary electrical and arterial systems. Review of: AMI, Angina, Valve disease, Heart Failure, Cardiac Arrhythmia, Pacemakers, and the ICD.   Cardiac Procedures: - Group verbal and written instruction and models to describe the testing methods done to diagnose heart disease. Reviews the outcomes of the test results. Describes the treatment choices: Medical Management, Angioplasty, or Coronary Bypass Surgery.   Cardiac Medications: - Group verbal and written instruction to review commonly prescribed medications for heart disease. Reviews the medication, class of the drug, and side effects. Includes the steps to properly store meds and maintain the prescription regimen.  Go Sex-Intimacy & Heart Disease, Get SMART - Goal Setting: - Group verbal and written instruction through game  format to discuss heart disease and the return to sexual intimacy. Provides group verbal and written material to discuss and apply goal setting through the application of the S.M.A.R.T. Method.   Other Matters of the Heart: - Provides group verbal, written materials and models to describe Heart Failure, Angina, Valve Disease, and Diabetes in the realm of heart disease. Includes description of the disease process and treatment options available to the cardiac patient.   Exercise & Equipment Safety: - Individual verbal instruction and demonstration of equipment use and safety with use of the equipment.          Cardiac Rehab from 01/22/2016 in [facility] Cardiac and Pulmonary Rehab   Date  01/22/16   Educator  C. Harless Molinari RN   Instruction Review Code  1- partially meets, needs review/practice      Infection Prevention: - Provides verbal and written material to individual with discussion of infection control including proper hand washing and proper equipment cleaning during exercise session.      Cardiac Rehab from 01/22/2016 in [facility] Cardiac and Pulmonary Rehab   Date  01/22/16   Educator  C. EnterkinRN   Instruction Review Code  2- meets goals/outcomes      Falls Prevention: - Provides verbal and written material to individual with discussion of falls prevention and safety.      Cardiac Rehab from 01/22/2016 in [facility] Cardiac and Pulmonary Rehab   Date  01/22/16   Educator  C. [place]   Instruction Review Code  2- meets goals/outcomes      Diabetes: - Individual verbal and written instruction to review signs/symptoms of diabetes, desired ranges of glucose level fasting, after meals and with exercise. Advice that pre and post exercise glucose checks will be done for 3 sessions at entry of program.    Knowledge Questionnaire Score:     Knowledge Questionnaire Score - 01/22/16 1420    Knowledge Questionnaire Score   Pre Score 25      Core Components/Risk Factors/Patient Goals at  Admission:     Personal Goals and Risk Factors at Admission - 01/22/16 1421    Core Components/Risk Factors/Patient Goals on Admission   Sedentary Yes   Intervention Provide advice, education, support and counseling about physical activity/exercise needs.;Develop an individualized exercise prescription for aerobic and resistive training based on initial evaluation findings, risk stratification, comorbidities and participant's personal goals.   Expected Outcomes Achievement of increased cardiorespiratory fitness and enhanced flexibility, muscular endurance and strength shown through measurements of functional capacity and personal statement of participant.   Hypertension Yes   Intervention Provide education on lifestyle modifcations including regular physical activity/exercise, weight management, moderate sodium restriction and increased consumption of fresh fruit, vegetables, and low fat dairy, alcohol moderation, and smoking cessation.;Monitor prescription use compliance.   Expected Outcomes Short Term: Continued assessment and intervention until BP is < 140/56mm HG in hypertensive participants. < 130/34mm HG in hypertensive participants with diabetes, heart failure or chronic kidney disease.;Long Term: Maintenance of blood pressure at goal levels.   Lipids Yes   Intervention Provide education and support for participant on nutrition & aerobic/resistive exercise along with prescribed medications to achieve LDL 70mg , HDL >40mg .   Expected Outcomes Short Term: Participant states understanding of desired cholesterol values and is compliant with medications prescribed. Participant is following exercise prescription and nutrition guidelines.;Long Term: Cholesterol controlled with medications as prescribed, with individualized exercise RX  and with personalized nutrition plan. Value goals: LDL < 70mg , HDL > 40 mg.   Stress Yes   Intervention Offer individual and/or small group education and counseling on  adjustment to heart disease, stress management and health-related lifestyle change. Teach and support self-help strategies.;Refer participants experiencing significant psychosocial distress to appropriate mental health specialists for further evaluation and treatment. When possible, include family members and significant others in education/counseling sessions.   Expected Outcomes Short Term: Participant demonstrates changes in health-related behavior, relaxation and other stress management skills, ability to obtain effective social support, and compliance with psychotropic medications if prescribed.;Long Term: Emotional wellbeing is indicated by absence of clinically significant psychosocial distress or social isolation.      Core Components/Risk Factors/Patient Goals Review:    Core Components/Risk Factors/Patient Goals at Discharge (Final Review):    ITP Comments:   Comments:

## 2016-01-26 ENCOUNTER — Encounter: Payer: Self-pay | Admitting: Dietician

## 2016-01-26 DIAGNOSIS — Z955 Presence of coronary angioplasty implant and graft: Secondary | ICD-10-CM | POA: Diagnosis not present

## 2016-01-26 NOTE — Progress Notes (Signed)
Daily Session Note  Patient Details  Name: Leah Espinoza MRN: [id number] Date of Birth: 12-Nov-1955 Referring Provider:    Encounter Date: 01/26/2016  Check In:     Session Check In - 01/26/16 1615    Check-In   Location [facility]-Cardiac & Pulmonary Rehab   Staff Present Heath Lark, RN, BSN, CCRP;Krystal Teachey, DPT, Ronaldo Miyamoto, BS, ACSM CEP, Exercise Physiologist   Supervising physician immediately available to respond to emergencies See telemetry face sheet for immediately available ER MD   Medication changes reported     No   Fall or balance concerns reported    No   Warm-up and Cool-down Performed on first and last piece of equipment   Resistance Training Performed Yes   VAD Patient? No         Goals Met:  Exercise tolerated well  Goals Unmet:  Not Applicable  Comments: First day of exercise! Patient was oriented to the gym and the equipment functions and settings. Procedures and policies of the gym were outlined and explained. The patient's individual exercise prescription and treatment plan were reviewed with them. All starting workloads were established based on the results of the functional testing  done at the initial intake visit. The plan for exercise progression was also introduced and progression will be customized based on the patient's performance and goals.    Dr. Emily Filbert is Medical Director for [place] and [facility].

## 2016-01-28 DIAGNOSIS — Z955 Presence of coronary angioplasty implant and graft: Secondary | ICD-10-CM

## 2016-01-28 NOTE — Progress Notes (Signed)
Daily Session Note  Patient Details  Name: Leah Espinoza MRN: [id number] Date of Birth: 17-Mar-1956 Referring Provider:    Encounter Date: 01/28/2016  Check In:     Session Check In - 01/28/16 1621    Check-In   Location [facility]-Cardiac & Pulmonary Rehab   Staff Present Gerlene Burdock, RN, Alex Gardener, DPT, CEEA;Diane Joya Gaskins, RN, BSN   Supervising physician immediately available to respond to emergencies See telemetry face sheet for immediately available ER MD   Medication changes reported     No   Fall or balance concerns reported    No   Warm-up and Cool-down Performed on first and last piece of equipment   Resistance Training Performed Yes   VAD Patient? No         Goals Met:  Independence with exercise equipment Exercise tolerated well No report of cardiac concerns or symptoms  Goals Unmet:  Patient completed exercise prescription and all exercise goals during rehab session. The exercise was tolerated well and the patient is progressing in the program.    Comments: Patient completed exercise prescription and all exercise goals during rehab session. The exercise was tolerated well and the patient is progressing in the program.    Dr. Emily Filbert is Medical Director for [place] and [facility].

## 2016-01-29 ENCOUNTER — Encounter: Payer: Managed Care, Other (non HMO) | Admitting: *Deleted

## 2016-01-29 DIAGNOSIS — Z955 Presence of coronary angioplasty implant and graft: Secondary | ICD-10-CM | POA: Diagnosis not present

## 2016-01-29 NOTE — Progress Notes (Signed)
Daily Session Note  Patient Details  Name: JOSETTA WIGAL MRN: [id number] Date of Birth: [birth year] Referring Provider:    Encounter Date: 01/29/2016  Check In:     Session Check In - 01/29/16 1654    Check-In   Location [facility]-Cardiac & Pulmonary Rehab   Staff Present Gerlene Burdock, RN, Alex Gardener, DPT, CEEA;Diane Joya Gaskins, RN, BSN   Supervising physician immediately available to respond to emergencies See telemetry face sheet for immediately available ER MD   Medication changes reported     No   Fall or balance concerns reported    No   Warm-up and Cool-down Performed on first and last piece of equipment   Resistance Training Performed Yes   Pain Assessment   Currently in Pain? No/denies         Goals Met:  Proper associated with RPD/PD & O2 Sat Exercise tolerated well  Goals Unmet:  Not Applicable  Comments:     Dr. Emily Filbert is Medical Director for [place] and [facility].

## 2016-02-01 ENCOUNTER — Encounter: Payer: Self-pay | Admitting: *Deleted

## 2016-02-01 DIAGNOSIS — Z955 Presence of coronary angioplasty implant and graft: Secondary | ICD-10-CM

## 2016-02-01 NOTE — Progress Notes (Signed)
Cardiac Individual Treatment Plan  Patient Details  Name: Leah Espinoza MRN: [id number] Date of Birth: [birth year] Referring Provider:    Initial Encounter Date:       Cardiac Rehab from 01/22/2016 in [facility] Cardiac and Pulmonary Rehab   Date  01/22/16      Visit Diagnosis: S/P coronary artery stent placement  Patient's Home Medications on Admission:  Current outpatient prescriptions:  .  aspirin EC 81 MG EC tablet, Take 1 tablet (81 mg total) by mouth daily., Disp: 30 tablet, Rfl: 5 .  metoprolol tartrate (LOPRESSOR) 25 MG tablet, Take 0.5 tablets (12.5 mg total) by mouth 2 (two) times daily., Disp: 60 tablet, Rfl: 5 .  ondansetron (ZOFRAN) 4 MG/2ML SOLN injection, Inject 2 mLs (4 mg total) into the vein every 6 (six) hours as needed for nausea., Disp: 2 mL, Rfl: 0 .  pravastatin (PRAVACHOL) 20 MG tablet, Take 20 mg by mouth daily., Disp: , Rfl:  .  pravastatin (PRAVACHOL) 20 MG tablet, Take 1 tablet (20 mg total) by mouth daily., Disp: 30 tablet, Rfl: 5 .  ticagrelor (BRILINTA) 90 MG TABS tablet, Take 1 tablet (90 mg total) by mouth 2 (two) times daily., Disp: 60 tablet, Rfl: 5  Past Medical History: Past Medical History  Diagnosis Date  . History of chicken pox   . History of fainting   . Hyperlipidemia   . Hypertension     Tobacco Use: History  Smoking status  . Former Smoker  Smokeless tobacco  . Never Used    Labs: Recent [employer] for ITP Cardiac and Pulmonary Rehab Latest Ref Rng 11/27/2014 04/04/2015 12/09/2015   Cholestrol 100 - 199 mg/dL 244(A) 288(A) 284(H)   LDLCALC 0 - 99 mg/dL 172 208 212(H)   HDL >39 mg/dL 47 60 54   Trlycerides 0 - 149 mg/dL 127 101 89       Exercise Target Goals:    Exercise Program Goal: Individual exercise prescription set with THRR, safety & activity barriers. Participant demonstrates ability to understand and report RPE using BORG scale, to self-measure pulse accurately, and to acknowledge the importance of  the exercise prescription.  Exercise Prescription Goal: Starting with aerobic activity 30 plus minutes a day, 3 days per week for initial exercise prescription. Provide home exercise prescription and guidelines that participant acknowledges understanding prior to discharge.  Activity Barriers & Risk Stratification:     Activity Barriers & Cardiac Risk Stratification - 01/22/16 1420    Activity Barriers & Cardiac Risk Stratification   Activity Barriers None   Cardiac Risk Stratification High      6 Minute Walk:     6 Minute Walk      01/22/16 1446       6 Minute Walk   Phase Initial     Distance 1400 feet     Walk Time 6 minutes     # of Rest Breaks 0     MPH 2.7     RPE 12     Symptoms No     Resting HR 59 bpm     Resting BP 122/62 mmHg     Max Ex. HR 99 bpm     Max Ex. BP 132/70 mmHg        Initial Exercise Prescription:     Initial Exercise Prescription - 01/22/16 1400    Date of Initial Exercise RX and Referring Provider   Date 01/22/16   Treadmill   MPH 2  Grade 0   Minutes 15   Recumbant Bike   Level 2   Watts 20   Minutes 15   NuStep   Level 2   Watts 40   Minutes 15   Cybex   Level 2   RPM 50   Minutes 15   Recumbant Elliptical   Level 1   Watts 10   Minutes 15   Elliptical   Level 1   Speed 3   Minutes 5   REL-XR   Level 2   Watts 50   Minutes 15   T5 Nustep   Level 2   Watts 40   Minutes 15   Biostep-RELP   Level 2   Watts 25   Minutes 15   Prescription Details   Frequency (times per week) 3   Duration Progress to 50 minutes of aerobic without signs/symptoms of physical distress   Intensity   THRR REST +  30   Ratings of Perceived Exertion 11-13   Perceived Dyspnea 0-4   Progression   Progression Continue to progress workloads to maintain intensity without signs/symptoms of physical distress.   Resistance Training   Training Prescription Yes   Weight 2   Reps 10-15      Perform Capillary Blood Glucose checks as  needed.  Exercise Prescription Changes:     Exercise Prescription Changes      01/27/16 1400           Exercise Review   Progression Yes       Response to Exercise   Blood Pressure (Admit) 128/70 mmHg       Blood Pressure (Exercise) 158/74 mmHg       Blood Pressure (Exit) 110/64 mmHg       Heart Rate (Admit) 57 bpm       Heart Rate (Exercise) 102 bpm       Heart Rate (Exit) 66 bpm       Rating of Perceived Exertion (Exercise) 12       Symptoms No       Duration Progress to 45 minutes of aerobic exercise without signs/symptoms of physical distress       Intensity Rest + 30       Progression   Progression Continue to progress workloads to maintain intensity without signs/symptoms of physical distress.       Resistance Training   Training Prescription Yes       Weight 2       Reps 10-15       Interval Training   Interval Training No       Treadmill   MPH 2       Grade 0       Minutes 15       Recumbant Elliptical   Level 1       Watts 12       Minutes 15          Exercise Comments:     Exercise Comments      01/22/16 1452 01/27/16 1431         Exercise Comments Initial Target heart rate will be rest + 30, monitoring for symptoms. (Demonstrates 75% blockage of circumflex and LAD arteries) Leah Espinoza recently started Heart Track and is developing familiarity with exercise equipment and routine.  She has had no signs of difficulty or distress during sessions.         Discharge Exercise Prescription (Final Exercise Prescription Changes):     Exercise Prescription  Changes - 01/27/16 1400    Exercise Review   Progression Yes   Response to Exercise   Blood Pressure (Admit) 128/70 mmHg   Blood Pressure (Exercise) 158/74 mmHg   Blood Pressure (Exit) 110/64 mmHg   Heart Rate (Admit) 57 bpm   Heart Rate (Exercise) 102 bpm   Heart Rate (Exit) 66 bpm   Rating of Perceived Exertion (Exercise) 12   Symptoms No   Duration Progress to 45 minutes of aerobic exercise without  signs/symptoms of physical distress   Intensity Rest + 30   Progression   Progression Continue to progress workloads to maintain intensity without signs/symptoms of physical distress.   Resistance Training   Training Prescription Yes   Weight 2   Reps 10-15   Interval Training   Interval Training No   Treadmill   MPH 2   Grade 0   Minutes 15   Recumbant Elliptical   Level 1   Watts 12   Minutes 15      Nutrition:  Target Goals: Understanding of nutrition guidelines, daily intake of sodium <1536m, cholesterol <2076m calories 30% from fat and 7% or less from saturated fats, daily to have 5 or more servings of fruits and vegetables.  Biometrics:     Pre Biometrics - 01/22/16 1439    Pre Biometrics   Height 5' 1.2" (1.554 m)   Weight 146 lb 3.2 oz (66.316 kg)   Waist Circumference 33.5 inches   Hip Circumference 42.5 inches   Waist to Hip Ratio 0.79 %   BMI (Calculated) 27.5       Nutrition Therapy Plan and Nutrition Goals:     Nutrition Therapy & Goals - 01/22/16 1532    Personal Nutrition Goals   Comments Leah Espinoza mentioned she has "irritable bowel".       Nutrition Discharge: Rate Your Plate Scores:     Nutrition Assessments - 01/26/16 1201    Rate Your Plate Scores   Pre Score 63   Pre Score % 70 %      Nutrition Goals Re-Evaluation:   Psychosocial: Target Goals: Acknowledge presence or absence of depression, maximize coping skills, provide positive support system. Participant is able to verbalize types and ability to use techniques and skills needed for reducing stress and depression.  Initial Review & Psychosocial Screening:     Initial Psych Review & Screening - 01/22/16 1534    Initial Review   Comments Leah Espinoza mentioned she has "irritable bowel".       Quality of Life Scores:     Quality of Life - 01/22/16 1418    Quality of Life Scores   Health/Function Pre 17.6 %   Socioeconomic Pre 24.57 %   Psych/Spiritual Pre 18.71 %   Family Pre  20.1 %   GLOBAL Pre 19.63 %      PHQ-9:     Recent Review Flowsheet Data    [facility] 01/22/2016 04/08/2015 11/14/2013   Decreased Interest 0 0 0   Down, Depressed, Hopeless 0 0 0   PHQ - 2 Score 0 0 0   Altered sleeping 1 - -   Tired, decreased energy 2 - -   Change in appetite 1 - -   Feeling bad or failure about yourself  0 - -   Trouble concentrating 0 - -   Moving slowly or fidgety/restless 0 - -   Suicidal thoughts 0 - -   PHQ-9 Score 4 - -   Difficult doing work/chores Not  difficult at all - -      Psychosocial Evaluation and Intervention:     Psychosocial Evaluation - 01/26/16 1656    Psychosocial Evaluation & Interventions   Interventions Encouraged to exercise with the program and follow exercise prescription;Stress management education   Comments Counselor met with Leah Espinoza today for initial psychosocial evaluation.  She is a 60 year old who had a heart attack on 4/11 with a stent insertion.  She has a strong support system with a spouse of 36 years and several adult children who live close by.  Leah Espinoza reports she is in good health overall with no sleeping or appetite problems.  She denies a history of depression or anxiety or any current symptoms.  Although she is typically in a positive mood, she states she has stressors of her job and her health.  Leah Espinoza has goals to be more active and be healthier overall.  Counselor will continue to follow with her while in this program.     Continued Psychosocial Services Needed Yes  Leah Espinoza will benefit from the psychoeducational components of this program - particularly stress management.        Psychosocial Re-Evaluation:   Vocational Rehabilitation: Provide vocational rehab assistance to qualifying candidates.   Vocational Rehab Evaluation & Intervention:     Vocational Rehab - 01/22/16 1420    Initial Vocational Rehab Evaluation & Intervention   Assessment shows need for Vocational  Rehabilitation No      Education: Education Goals: Education classes will be provided on a weekly basis, covering required topics. Participant will state understanding/return demonstration of topics presented.  Learning Barriers/Preferences:     Learning Barriers/Preferences - 01/22/16 1420    Learning Barriers/Preferences   Learning Barriers None   Learning Preferences None      Education Topics: General Nutrition Guidelines/Fats and Fiber: -Group instruction provided by verbal, written material, models and posters to present the general guidelines for heart healthy nutrition. Gives an explanation and review of dietary fats and fiber.   Controlling Sodium/Reading Food Labels: -Group verbal and written material supporting the discussion of sodium use in heart healthy nutrition. Review and explanation with models, verbal and written materials for utilization of the food label.   Exercise Physiology & Risk Factors: - Group verbal and written instruction with models to review the exercise physiology of the cardiovascular system and associated critical values. Details cardiovascular disease risk factors and the goals associated with each risk factor.   Aerobic Exercise & Resistance Training: - Gives group verbal and written discussion on the health impact of inactivity. On the components of aerobic and resistive training programs and the benefits of this training and how to safely progress through these programs.   Flexibility, Balance, General Exercise Guidelines: - Provides group verbal and written instruction on the benefits of flexibility and balance training programs. Provides general exercise guidelines with specific guidelines to those with heart or lung disease. Demonstration and skill practice provided.   Stress Management: - Provides group verbal and written instruction about the health risks of elevated stress, cause of high stress, and healthy ways to reduce  stress.   Depression: - Provides group verbal and written instruction on the correlation between heart/lung disease and depressed mood, treatment options, and the stigmas associated with seeking treatment.   Anatomy & Physiology of the Heart: - Group verbal and written instruction and models provide basic cardiac anatomy and physiology, with the coronary electrical and arterial systems. Review of: AMI, Angina, Valve  disease, Heart Failure, Cardiac Arrhythmia, Pacemakers, and the ICD.   Cardiac Procedures: - Group verbal and written instruction and models to describe the testing methods done to diagnose heart disease. Reviews the outcomes of the test results. Describes the treatment choices: Medical Management, Angioplasty, or Coronary Bypass Surgery.   Cardiac Medications: - Group verbal and written instruction to review commonly prescribed medications for heart disease. Reviews the medication, class of the drug, and side effects. Includes the steps to properly store meds and maintain the prescription regimen.          Cardiac Rehab from 01/28/2016 in [facility] Cardiac and Pulmonary Rehab   Date  01/28/16   Educator  DW   Instruction Review Code  2- meets goals/outcomes      Go Sex-Intimacy & Heart Disease, Get SMART - Goal Setting: - Group verbal and written instruction through game format to discuss heart disease and the return to sexual intimacy. Provides group verbal and written material to discuss and apply goal setting through the application of the S.M.A.R.T. Method.   Other Matters of the Heart: - Provides group verbal, written materials and models to describe Heart Failure, Angina, Valve Disease, and Diabetes in the realm of heart disease. Includes description of the disease process and treatment options available to the cardiac patient.   Exercise & Equipment Safety: - Individual verbal instruction and demonstration of equipment use and safety with use of the equipment.       Cardiac Rehab from 01/28/2016 in [facility] Cardiac and Pulmonary Rehab   Date  01/22/16   Educator  C. Enterkin RN   Instruction Review Code  1- partially meets, needs review/practice      Infection Prevention: - Provides verbal and written material to individual with discussion of infection control including proper hand washing and proper equipment cleaning during exercise session.      Cardiac Rehab from 01/28/2016 in [facility] Cardiac and Pulmonary Rehab   Date  01/22/16   Educator  C. EnterkinRN   Instruction Review Code  2- meets goals/outcomes      Falls Prevention: - Provides verbal and written material to individual with discussion of falls prevention and safety.      Cardiac Rehab from 01/28/2016 in [facility] Cardiac and Pulmonary Rehab   Date  01/22/16   Educator  C. [place]   Instruction Review Code  2- meets goals/outcomes      Diabetes: - Individual verbal and written instruction to review signs/symptoms of diabetes, desired ranges of glucose level fasting, after meals and with exercise. Advice that pre and post exercise glucose checks will be done for 3 sessions at entry of program.    Knowledge Questionnaire Score:     Knowledge Questionnaire Score - 01/22/16 1420    Knowledge Questionnaire Score   Pre Score 25      Core Components/Risk Factors/Patient Goals at Admission:     Personal Goals and Risk Factors at Admission - 01/22/16 1421    Core Components/Risk Factors/Patient Goals on Admission   Sedentary Yes   Intervention Provide advice, education, support and counseling about physical activity/exercise needs.;Develop an individualized exercise prescription for aerobic and resistive training based on initial evaluation findings, risk stratification, comorbidities and participant's personal goals.   Expected Outcomes Achievement of increased cardiorespiratory fitness and enhanced flexibility, muscular endurance and strength shown through measurements of functional  capacity and personal statement of participant.   Hypertension Yes   Intervention Provide education on lifestyle modifcations including regular physical activity/exercise, weight management, moderate  sodium restriction and increased consumption of fresh fruit, vegetables, and low fat dairy, alcohol moderation, and smoking cessation.;Monitor prescription use compliance.   Expected Outcomes Short Term: Continued assessment and intervention until BP is < 140/71m HG in hypertensive participants. < 130/872mHG in hypertensive participants with diabetes, heart failure or chronic kidney disease.;Long Term: Maintenance of blood pressure at goal levels.   Lipids Yes   Intervention Provide education and support for participant on nutrition & aerobic/resistive exercise along with prescribed medications to achieve LDL <7034mHDL >13m44m Expected Outcomes Short Term: Participant states understanding of desired cholesterol values and is compliant with medications prescribed. Participant is following exercise prescription and nutrition guidelines.;Long Term: Cholesterol controlled with medications as prescribed, with individualized exercise RX and with personalized nutrition plan. Value goals: LDL < 70mg78mL > 40 mg.   Stress Yes   Intervention Offer individual and/or small group education and counseling on adjustment to heart disease, stress management and health-related lifestyle change. Teach and support self-help strategies.;Refer participants experiencing significant psychosocial distress to appropriate mental health specialists for further evaluation and treatment. When possible, include family members and significant others in education/counseling sessions.   Expected Outcomes Short Term: Participant demonstrates changes in health-related behavior, relaxation and other stress management skills, ability to obtain effective social support, and compliance with psychotropic medications if prescribed.;Long Term:  Emotional wellbeing is indicated by absence of clinically significant psychosocial distress or social isolation.      Core Components/Risk Factors/Patient Goals Review:    Core Components/Risk Factors/Patient Goals at Discharge (Final Review):    ITP Comments:     ITP Comments      02/01/16 1134           ITP Comments 30 day review. Continue with ITP.  New to program  3 visits since orientation on 5/11          Comments:

## 2016-02-02 DIAGNOSIS — Z955 Presence of coronary angioplasty implant and graft: Secondary | ICD-10-CM | POA: Diagnosis not present

## 2016-02-02 NOTE — Progress Notes (Signed)
Daily Session Note  Patient Details  Name: Leah Espinoza MRN: [id number] Date of Birth: December 15, 1955 Referring Provider:    Encounter Date: 02/02/2016  Check In:     Session Check In - 02/02/16 1621    Check-In   Location [facility]-Cardiac & Pulmonary Rehab   Staff Present Nada Maclachlan, BA, ACSM CEP, Exercise Physiologist;Danitra Payano Brayton El, DPT, Ronaldo Miyamoto, BS, ACSM CEP, Exercise Physiologist;Diane Joya Gaskins, RN, BSN   Supervising physician immediately available to respond to emergencies See telemetry face sheet for immediately available ER MD   Medication changes reported     No   Fall or balance concerns reported    No   Warm-up and Cool-down Performed on first and last piece of equipment   Resistance Training Performed Yes   VAD Patient? No         Goals Met:  Independence with exercise equipment No report of cardiac concerns or symptoms Strength training completed today  Goals Unmet:  Not Applicable  Comments: Patient completed exercise prescription and all exercise goals during rehab session. The exercise was tolerated well and the patient is progressing in the program.    Dr. Emily Filbert is Medical Director for [place] and [facility].

## 2016-02-04 ENCOUNTER — Encounter: Payer: Managed Care, Other (non HMO) | Admitting: *Deleted

## 2016-02-04 DIAGNOSIS — Z955 Presence of coronary angioplasty implant and graft: Secondary | ICD-10-CM

## 2016-02-04 NOTE — Progress Notes (Signed)
Daily Session Note  Patient Details  Name: Leah Espinoza MRN: [id number] Date of Birth: June 06, 1956 Referring Provider:    Encounter Date: 02/04/2016  Check In:     Session Check In - 02/04/16 1624    Check-In   Location [facility]-Cardiac & Pulmonary Rehab   Staff Present Gerlene Burdock, RN, BSN;Rebecca Sickles, DPT, Burlene Arnt, BA, ACSM CEP, Exercise Physiologist;Diane Joya Gaskins, RN, BSN   Supervising physician immediately available to respond to emergencies See telemetry face sheet for immediately available ER MD   Medication changes reported     No   Fall or balance concerns reported    No   Warm-up and Cool-down Performed on first and last piece of equipment   Resistance Training Performed Yes   VAD Patient? No         Goals Met:  Independence with exercise equipment Exercise tolerated well No report of cardiac concerns or symptoms Strength training completed today  Goals Unmet:  Not Applicable  Comments:  Patient completed exercise prescription and all exercise goals during rehab session. The exercise was tolerated well and the patient is progressing in the program.    Dr. Emily Filbert is Medical Director for [place] and [facility].

## 2016-02-05 DIAGNOSIS — Z955 Presence of coronary angioplasty implant and graft: Secondary | ICD-10-CM | POA: Diagnosis not present

## 2016-02-05 NOTE — Progress Notes (Signed)
Daily Session Note  Patient Details  Name: Leah Espinoza MRN: [id number] Date of Birth: 1956/06/13 Referring Provider:    Encounter Date: 02/05/2016  Check In:     Session Check In - 02/05/16 1638    Check-In   Location [facility]-Cardiac & Pulmonary Rehab   Staff Present Jeanell Sparrow, DPT, Tomi Bamberger, RN, Vickki Hearing, BA, ACSM CEP, Exercise Physiologist;Carroll Enterkin, RN, BSN   Supervising physician immediately available to respond to emergencies See telemetry face sheet for immediately available ER MD   Medication changes reported     No   Fall or balance concerns reported    No   Warm-up and Cool-down Performed on first and last piece of equipment   Resistance Training Performed Yes   VAD Patient? No   Pain Assessment   Currently in Pain? No/denies   Multiple Pain Sites No         Goals Met:  Independence with exercise equipment Exercise tolerated well No report of cardiac concerns or symptoms Strength training completed today  Goals Unmet:  Not Applicable  Comments: Patient completed exercise prescription and all exercise goals during rehab session. The exercise was tolerated well and the patient is progressing in the program.   Dr. Emily Filbert is Medical Director for [place] and [facility].

## 2016-02-11 ENCOUNTER — Encounter: Payer: Managed Care, Other (non HMO) | Admitting: *Deleted

## 2016-02-11 DIAGNOSIS — Z955 Presence of coronary angioplasty implant and graft: Secondary | ICD-10-CM

## 2016-02-11 NOTE — Progress Notes (Signed)
Daily Session Note  Patient Details  Name: Leah Espinoza MRN: [id number] Date of Birth: 1956-02-09 Referring Provider:    Encounter Date: 02/11/2016  Check In:     Session Check In - 02/11/16 1619    Check-In   Location [facility]-Cardiac & Pulmonary Rehab   Staff Present Gerlene Burdock, RN, Vickki Hearing, BA, ACSM CEP, Exercise Physiologist;Diane Joya Gaskins, RN, BSN   Supervising physician immediately available to respond to emergencies See telemetry face sheet for immediately available ER MD   Medication changes reported     No   Fall or balance concerns reported    No   Warm-up and Cool-down Performed on first and last piece of equipment   Resistance Training Performed Yes   VAD Patient? No   Pain Assessment   Currently in Pain? No/denies         Goals Met:  Independence with exercise equipment Exercise tolerated well No report of cardiac concerns or symptoms Strength training completed today  Goals Unmet:  Not Applicable  Comments:  Pt able to follow exercise prescription today without complaint.  Will continue to monitor for progression.    Dr. Emily Filbert is Medical Director for [place] and [facility].

## 2016-02-12 ENCOUNTER — Encounter: Payer: Managed Care, Other (non HMO) | Attending: Cardiology

## 2016-02-12 DIAGNOSIS — Z955 Presence of coronary angioplasty implant and graft: Secondary | ICD-10-CM | POA: Insufficient documentation

## 2016-02-12 NOTE — Progress Notes (Signed)
Daily Session Note  Patient Details  Name: Leah Espinoza MRN: [id number] Date of Birth: June 03, 1956 Referring Provider:    Encounter Date: 02/12/2016  Check In:     Session Check In - 02/12/16 1645    Check-In   Location [facility]-Cardiac & Pulmonary Rehab   Staff Present Gerlene Burdock, RN, Vickki Hearing, BA, ACSM CEP, Exercise Physiologist;Diane Joya Gaskins, RN, BSN   Supervising physician immediately available to respond to emergencies See telemetry face sheet for immediately available ER MD   Medication changes reported     No   Fall or balance concerns reported    No   Warm-up and Cool-down Performed on first and last piece of equipment   Resistance Training Performed Yes   VAD Patient? No   Pain Assessment   Currently in Pain? No/denies   Multiple Pain Sites No         Goals Met:  Independence with exercise equipment Exercise tolerated well No report of cardiac concerns or symptoms Strength training completed today  Goals Unmet:  Not Applicable  Comments: Reviewed individual exercise prescription with patient.  Changes were noted and patient was able to perform at new work load without signs or symptoms.   Dr. Emily Filbert is Medical Director for [place] and [facility].

## 2016-02-16 ENCOUNTER — Encounter: Payer: Self-pay | Admitting: Internal Medicine

## 2016-02-16 ENCOUNTER — Encounter: Payer: Managed Care, Other (non HMO) | Admitting: *Deleted

## 2016-02-16 ENCOUNTER — Ambulatory Visit (INDEPENDENT_AMBULATORY_CARE_PROVIDER_SITE_OTHER): Payer: Managed Care, Other (non HMO) | Admitting: Internal Medicine

## 2016-02-16 VITALS — BP 136/82 | HR 62 | Temp 97.8°F | Ht 60.5 in | Wt 143.0 lb

## 2016-02-16 DIAGNOSIS — E7800 Pure hypercholesterolemia, unspecified: Secondary | ICD-10-CM

## 2016-02-16 DIAGNOSIS — R030 Elevated blood-pressure reading, without diagnosis of hypertension: Secondary | ICD-10-CM

## 2016-02-16 DIAGNOSIS — Z955 Presence of coronary angioplasty implant and graft: Secondary | ICD-10-CM | POA: Diagnosis not present

## 2016-02-16 DIAGNOSIS — I2119 ST elevation (STEMI) myocardial infarction involving other coronary artery of inferior wall: Secondary | ICD-10-CM

## 2016-02-16 DIAGNOSIS — IMO0001 Reserved for inherently not codable concepts without codable children: Secondary | ICD-10-CM

## 2016-02-16 NOTE — Progress Notes (Signed)
Pre visit review using our clinic review tool, if applicable. No additional management support is needed unless otherwise documented below in the visit note. 

## 2016-02-16 NOTE — Progress Notes (Signed)
Patient ID: Leah Espinoza, female   DOB: [birth year], 60 y.o.   MRN: [id number]   Subjective:    Patient ID: Leah Espinoza, female    DOB: 1956-05-02, 60 y.o.   MRN: [id number]  HPI  Patient here for a scheduled follow up.  She is s/p recent anterior STEMI, DES proximal LAD 12/23/15.  Cath with residual 75% mid left cfx and 75% mid LAD.  She is seeing cardiology.  Just reevaluated.  Note reviewed.  EF 45%.  ETT sestamibi 01/23/16 - negative for ischemia.  She has adjusted her diet.  Low weight.  Taking her cholesterol medication regularly now.  No chest pain.  Trying to stay active.  No sob.  No acid reflux.  No abdominal pain or cramping.  Bowels stable.     Past Medical History  Diagnosis Date  . History of chicken pox   . History of fainting   . Hyperlipidemia   . Hypertension    Past Surgical History  Procedure Laterality Date  . Cardiac catheterization N/A 12/23/2015    Procedure: Left Heart Cath and Coronary Angiography;  Surgeon: Isaias Cowman, MD;  Location: [place] [facility];  Service: Cardiovascular;  Laterality: N/A;  . Cardiac catheterization N/A 12/23/2015    Procedure: Coronary Stent Intervention;  Surgeon: Isaias Cowman, MD;  Location: [place] [facility];  Service: Cardiovascular;  Laterality: N/A;   Family History  Problem Relation Age of Onset  . Hyperlipidemia Mother   . Heart disease Mother   . Cancer Father     lung  . Sudden death Brother   . Arthritis Maternal Grandmother    Social History   Social History  . Marital Status: Married    Spouse Name: N/A  . Number of Children: N/A  . Years of Education: N/A   Social History Main Topics  . Smoking status: Former Research scientist (life sciences)  . Smokeless tobacco: Never Used  . Alcohol Use: 0.0 oz/week    0 Standard drinks or equivalent per week  . Drug Use: No  . Sexual Activity: Not Asked   Other Topics Concern  . None   Social History Narrative    Outpatient Encounter Prescriptions as of 02/16/2016    Medication Sig  . aspirin EC 81 MG EC tablet Take 1 tablet (81 mg total) by mouth daily.  . metoprolol tartrate (LOPRESSOR) 25 MG tablet Take 0.5 tablets (12.5 mg total) by mouth 2 (two) times daily.  . pravastatin (PRAVACHOL) 20 MG tablet Take 20 mg by mouth daily.  . pravastatin (PRAVACHOL) 20 MG tablet Take 1 tablet (20 mg total) by mouth daily.  . ticagrelor (BRILINTA) 90 MG TABS tablet Take 1 tablet (90 mg total) by mouth 2 (two) times daily.  . [DISCONTINUED] ondansetron (ZOFRAN) 4 MG/2ML SOLN injection Inject 2 mLs (4 mg total) into the vein every 6 (six) hours as needed for nausea.   No facility-administered encounter medications on file as of 02/16/2016.    Review of Systems  Constitutional: Negative for appetite change and unexpected weight change.  HENT: Positive for sinus pressure. Negative for congestion.   Respiratory: Negative for cough, chest tightness and shortness of breath.   Cardiovascular: Negative for chest pain, palpitations and leg swelling.  Gastrointestinal: Negative for nausea, vomiting, abdominal pain and diarrhea.  Genitourinary: Negative for dysuria and difficulty urinating.  Musculoskeletal: Negative for back pain and joint swelling.  Skin: Negative for color change and rash.  Neurological: Negative for dizziness, light-headedness and headaches.  Psychiatric/Behavioral: Negative  for dysphoric mood and agitation.       Objective:     Blood pressure rechecked by me:  120/78  Physical Exam  Constitutional: She appears well-developed and well-nourished. No distress.  HENT:  Nose: Nose normal.  Mouth/Throat: Oropharynx is clear and moist.  Neck: Neck supple. No thyromegaly present.  Cardiovascular: Normal rate and regular rhythm.   Pulmonary/Chest: Breath sounds normal. No respiratory distress. She has no wheezes.  Abdominal: Soft. Bowel sounds are normal. There is no tenderness.  Musculoskeletal: She exhibits no edema or tenderness.  Lymphadenopathy:     She has no cervical adenopathy.  Skin: No rash noted. No erythema.  Psychiatric: She has a normal mood and affect. Her behavior is normal.    BP 136/82 mmHg  Pulse 62  Temp(Src) 97.8 F (36.6 C) (Oral)  Ht 5' 0.5" (1.537 m)  Wt 143 lb (64.864 kg)  BMI 27.46 kg/m2  SpO2 98% Wt Readings from Last 3 Encounters:  02/16/16 143 lb (64.864 kg)  01/22/16 146 lb 3.2 oz (66.316 kg)  12/25/15 151 lb 12.8 oz (68.856 kg)     Lab Results  Component Value Date   WBC 8.1 12/25/2015   HGB 11.3* 12/25/2015   HCT 33.0* 12/25/2015   PLT 207 12/25/2015   GLUCOSE 98 02/17/2016   CHOL 234* 02/17/2016   TRIG 102 02/17/2016   HDL 52 02/17/2016   LDLCALC 162* 02/17/2016   ALT 10 02/17/2016   AST 17 02/17/2016   NA 141 02/17/2016   K 4.4 02/17/2016   CL 103 02/17/2016   CREATININE 0.97 02/17/2016   BUN 15 02/17/2016   CO2 22 02/17/2016   TSH 2.020 12/09/2015   INR 1.06 12/24/2015       Assessment & Plan:   Problem List Items Addressed This Visit    Elevated blood pressure    On lopressor.  Blood pressures is doing well.  Follow.        Hypercholesterolemia - Primary    Discussed at length with her.  May need to change to crestor.  She wants to stay with pravastatin because she know she tolerates this medication.  Recheck lipid panel and liver function tests.   Continue low cholesterol diet and exercise.        ST elevation myocardial infarction (STEMI) of inferior wall, initial episode of care [facility])    Just recently admitted.  S/p stent placement as outlined.  On Brilinta.  Followed by cardiology.  Continue aggressive risk factor modification.            Einar Pheasant, MD

## 2016-02-16 NOTE — Progress Notes (Signed)
Daily Session Note  Patient Details  Name: LARUEN RISSER MRN: [id number] Date of Birth: [birth year] Referring Provider:    Encounter Date: 02/16/2016  Check In:     Session Check In - 02/16/16 1629    Check-In   Location [facility]   Staff Present Earlean Shawl, BS, ACSM CEP, Exercise Physiologist;Amanda Oletta Darter, BA, ACSM CEP, Exercise Physiologist;Diane Joya Gaskins, RN, BSN   Supervising physician immediately available to respond to emergencies See telemetry face sheet for immediately available ER MD   Medication changes reported     No   Fall or balance concerns reported    No   Warm-up and Cool-down Performed on first and last piece of equipment   Resistance Training Performed Yes   VAD Patient? No   Pain Assessment   Currently in Pain? No/denies   Multiple Pain Sites No         Goals Met:  Independence with exercise equipment Exercise tolerated well No report of cardiac concerns or symptoms Strength training completed today  Goals Unmet:  Not Applicable  Comments: Patient completed exercise prescription and all exercise goals during rehab session. The exercise was tolerated well and the patient is progressing in the program.     Dr. Emily Filbert is Medical Director for [place] and [facility].

## 2016-02-17 ENCOUNTER — Other Ambulatory Visit: Payer: Self-pay | Admitting: Internal Medicine

## 2016-02-18 ENCOUNTER — Encounter: Payer: Managed Care, Other (non HMO) | Admitting: *Deleted

## 2016-02-18 DIAGNOSIS — Z955 Presence of coronary angioplasty implant and graft: Secondary | ICD-10-CM | POA: Diagnosis not present

## 2016-02-18 LAB — HEPATIC FUNCTION PANEL
ALBUMIN: 4.3 g/dL (ref 3.6–4.8)
ALK PHOS: 81 IU/L (ref 39–117)
ALT: 10 IU/L (ref 0–32)
AST: 17 IU/L (ref 0–40)
BILIRUBIN TOTAL: 0.4 mg/dL (ref 0.0–1.2)
Bilirubin, Direct: 0.1 mg/dL (ref 0.00–0.40)
Total Protein: 7.4 g/dL (ref 6.0–8.5)

## 2016-02-18 LAB — BASIC METABOLIC PANEL
BUN / CREAT RATIO: 15 (ref 12–28)
BUN: 15 mg/dL (ref 8–27)
CALCIUM: 9.4 mg/dL (ref 8.7–10.3)
CHLORIDE: 103 mmol/L (ref 96–106)
CO2: 22 mmol/L (ref 18–29)
Creatinine, Ser: 0.97 mg/dL (ref 0.57–1.00)
GFR calc non Af Amer: 63 mL/min/{1.73_m2} (ref >59)
GFR, EST AFRICAN AMERICAN: 73 mL/min/{1.73_m2} (ref >59)
GLUCOSE: 98 mg/dL (ref 65–99)
POTASSIUM: 4.4 mmol/L (ref 3.5–5.2)
Sodium: 141 mmol/L (ref 134–144)

## 2016-02-18 LAB — LIPID PANEL W/O CHOL/HDL RATIO
Cholesterol, Total: 234 mg/dL — ABNORMAL HIGH (ref 100–199)
HDL: 52 mg/dL (ref >39)
LDL Calculated: 162 mg/dL — ABNORMAL HIGH (ref 0–99)
Triglycerides: 102 mg/dL (ref 0–149)
VLDL Cholesterol Cal: 20 mg/dL (ref 5–40)

## 2016-02-18 NOTE — Progress Notes (Signed)
Daily Session Note  Patient Details  Name: Leah Espinoza MRN: [id number] Date of Birth: 1956-01-24 Referring Provider:    Encounter Date: 02/18/2016  Check In:     Session Check In - 02/18/16 1647    Check-In   Location [facility]   Staff Present Gerlene Burdock, RN, Vickki Hearing, BA, ACSM CEP, Exercise Physiologist;Diane Joya Gaskins, RN, BSN   Supervising physician immediately available to respond to emergencies See telemetry face sheet for immediately available ER MD   Medication changes reported     No   Fall or balance concerns reported    No   Warm-up and Cool-down Performed on first and last piece of equipment   Resistance Training Performed Yes   VAD Patient? No   Pain Assessment   Currently in Pain? No/denies         Goals Met:  Proper associated with RPD/PD & O2 Sat Exercise tolerated well  Goals Unmet:  Not Applicable  Comments:     Dr. Emily Filbert is Medical Director for [place] and [facility].

## 2016-02-19 ENCOUNTER — Encounter: Payer: Managed Care, Other (non HMO) | Admitting: *Deleted

## 2016-02-19 DIAGNOSIS — Z955 Presence of coronary angioplasty implant and graft: Secondary | ICD-10-CM | POA: Diagnosis not present

## 2016-02-19 NOTE — Progress Notes (Signed)
Daily Session Note  Patient Details  Name: Leah Espinoza MRN: [id number] Date of Birth: November 06, 1955 Referring Provider:    Encounter Date: 02/19/2016  Check In:     Session Check In - 02/19/16 1701    Check-In   Location [facility]   Staff Present Gerlene Burdock, RN, BSN;Diane Joya Gaskins, RN, Vickki Hearing, BA, ACSM CEP, Exercise Physiologist   Supervising physician immediately available to respond to emergencies See telemetry face sheet for immediately available ER MD   Medication changes reported     No   Fall or balance concerns reported    No   Warm-up and Cool-down Performed on first and last piece of equipment   Resistance Training Performed Yes   VAD Patient? No   Pain Assessment   Currently in Pain? No/denies         Goals Met:  Proper associated with RPD/PD & O2 Sat Exercise tolerated well  Goals Unmet:  Not Applicable  Comments:     Dr. Emily Filbert is Medical Director for [place] and [facility].

## 2016-02-22 NOTE — Assessment & Plan Note (Signed)
On lopressor.  Blood pressures is doing well.  Follow.

## 2016-02-22 NOTE — Assessment & Plan Note (Signed)
Discussed at length with her.  May need to change to crestor.  She wants to stay with pravastatin because she know she tolerates this medication.  Recheck lipid panel and liver function tests.   Continue low cholesterol diet and exercise.

## 2016-02-22 NOTE — Assessment & Plan Note (Signed)
Just recently admitted.  S/p stent placement as outlined.  On Brilinta.  Followed by cardiology.  Continue aggressive risk factor modification.

## 2016-02-23 ENCOUNTER — Other Ambulatory Visit: Payer: Self-pay | Admitting: *Deleted

## 2016-02-23 DIAGNOSIS — Z955 Presence of coronary angioplasty implant and graft: Secondary | ICD-10-CM

## 2016-02-23 MED ORDER — PRAVASTATIN SODIUM 40 MG PO TABS: 40.0000 mg | ORAL_TABLET | Freq: Every day | ORAL | Status: DC

## 2016-02-23 NOTE — Progress Notes (Signed)
Daily Session Note  Patient Details  Name: Leah Espinoza MRN: [id number] Date of Birth: 1956/05/07 Referring Provider:    Encounter Date: 02/23/2016  Check In:     Session Check In - 02/23/16 1642    Check-In   Location [facility]   Staff Present Jeanell Sparrow, DPT, Ronaldo Miyamoto, BS, ACSM CEP, Exercise Physiologist;Diane Joya Gaskins, RN, BSN   Supervising physician immediately available to respond to emergencies See telemetry face sheet for immediately available ER MD   Medication changes reported     No   Fall or balance concerns reported    No   Warm-up and Cool-down Performed on first and last piece of equipment   Resistance Training Performed Yes   VAD Patient? No   Pain Assessment   Currently in Pain? No/denies         Goals Met:  Independence with exercise equipment Exercise tolerated well No report of cardiac concerns or symptoms  Goals Unmet:  Not Applicable  Comments: Patient completed exercise prescription and all exercise goals during rehab session. The exercise was tolerated well and the patient is progressing in the program.    Dr. Emily Filbert is Medical Director for [place] and [facility].

## 2016-02-23 NOTE — Telephone Encounter (Signed)
Rx sent to pharmacy   

## 2016-02-25 ENCOUNTER — Encounter: Payer: Managed Care, Other (non HMO) | Admitting: *Deleted

## 2016-02-25 DIAGNOSIS — Z955 Presence of coronary angioplasty implant and graft: Secondary | ICD-10-CM | POA: Diagnosis not present

## 2016-02-25 NOTE — Progress Notes (Signed)
Daily Session Note  Patient Details  Name: Leah Espinoza MRN: [id number] Date of Birth: 09/29/1955 Referring Provider:    Encounter Date: 02/25/2016  Check In:     Session Check In - 02/25/16 1748    Check-In   Medication changes reported     --  reported Pravastatin up to 40 mg, had reported change earlier this week.  Med list is correct dose when checked today         Goals Met:  Independence with exercise equipment Exercise tolerated well No report of cardiac concerns or symptoms Strength training completed today  Goals Unmet:  Not Applicable  Comments: med change noted. Doing well with exercise prescription progression.    Dr. Mark Miller is Medical Director for [facility] and [facility]. 

## 2016-02-26 DIAGNOSIS — Z955 Presence of coronary angioplasty implant and graft: Secondary | ICD-10-CM | POA: Diagnosis not present

## 2016-02-26 NOTE — Progress Notes (Signed)
Daily Session Note  Patient Details  Name: Leah Espinoza MRN: [id number] Date of Birth: [birth year] Referring Provider:    Encounter Date: 02/26/2016  Check In:     Session Check In - 02/26/16 1635    Check-In   Location [facility]   Staff Present Gerlene Burdock, RN, Vickki Hearing, BA, ACSM CEP, Exercise Physiologist;Diane Joya Gaskins, RN, BSN   Supervising physician immediately available to respond to emergencies See telemetry face sheet for immediately available ER MD   Medication changes reported     No   Fall or balance concerns reported    No   Warm-up and Cool-down Performed on first and last piece of equipment   Resistance Training Performed Yes   VAD Patient? No   Pain Assessment   Currently in Pain? No/denies   Multiple Pain Sites No           Exercise Prescription Changes - 02/26/16 1300    Exercise Review   Progression Yes   Response to Exercise   Blood Pressure (Admit) 106/72 mmHg   Blood Pressure (Exercise) 152/70 mmHg   Blood Pressure (Exit) 114/66 mmHg   Heart Rate (Admit) 73 bpm   Heart Rate (Exercise) 106 bpm   Heart Rate (Exit) 67 bpm   Rating of Perceived Exertion (Exercise) 12   Symptoms no   Intensity THRR unchanged   Progression   Progression Continue to progress workloads to maintain intensity without signs/symptoms of physical distress.   Resistance Training   Training Prescription Yes   Weight 4   Reps 10-15   Interval Training   Interval Training Yes   Treadmill   MPH 3.2   Grade 2   Minutes 22   REL-XR   Level 2   Watts 70   Minutes 15      Goals Met:  Independence with exercise equipment Exercise tolerated well No report of cardiac concerns or symptoms Strength training completed today  Goals Unmet:  Not Applicable  Comments: Added interval training today   Dr. Emily Filbert is Medical Director for [place] and [facility].

## 2016-02-27 ENCOUNTER — Emergency Department
Admission: EM | Admit: 2016-02-27 | Discharge: 2016-02-27 | Disposition: A | Discharge status: Home / Self Care | Payer: Managed Care, Other (non HMO) | Attending: Emergency Medicine | Admitting: Emergency Medicine

## 2016-02-27 ENCOUNTER — Emergency Department: Payer: Managed Care, Other (non HMO)

## 2016-02-27 ENCOUNTER — Encounter: Payer: Self-pay | Admitting: Emergency Medicine

## 2016-02-27 ENCOUNTER — Other Ambulatory Visit: Payer: Self-pay

## 2016-02-27 DIAGNOSIS — Z87891 Personal history of nicotine dependence: Secondary | ICD-10-CM | POA: Diagnosis not present

## 2016-02-27 DIAGNOSIS — Z7982 Long term (current) use of aspirin: Secondary | ICD-10-CM | POA: Insufficient documentation

## 2016-02-27 DIAGNOSIS — E785 Hyperlipidemia, unspecified: Secondary | ICD-10-CM | POA: Insufficient documentation

## 2016-02-27 DIAGNOSIS — I10 Essential (primary) hypertension: Secondary | ICD-10-CM | POA: Diagnosis not present

## 2016-02-27 DIAGNOSIS — R0602 Shortness of breath: Secondary | ICD-10-CM | POA: Insufficient documentation

## 2016-02-27 DIAGNOSIS — I252 Old myocardial infarction: Secondary | ICD-10-CM | POA: Diagnosis not present

## 2016-02-27 DIAGNOSIS — Z79899 Other long term (current) drug therapy: Secondary | ICD-10-CM | POA: Diagnosis not present

## 2016-02-27 HISTORY — DX: Acute myocardial infarction, unspecified: I21.9

## 2016-02-27 LAB — BASIC METABOLIC PANEL
Anion gap: 12 (ref 5–15)
BUN: 15 mg/dL (ref 6–20)
CALCIUM: 9.2 mg/dL (ref 8.9–10.3)
CO2: 20 mmol/L — ABNORMAL LOW (ref 22–32)
Chloride: 107 mmol/L (ref 101–111)
Creatinine, Ser: 1.03 mg/dL — ABNORMAL HIGH (ref 0.44–1.00)
GFR calc Af Amer: >60 mL/min (ref >60)
GFR, EST NON AFRICAN AMERICAN: 57 mL/min — AB (ref >60)
GLUCOSE: 116 mg/dL — AB (ref 65–99)
Potassium: 3.4 mmol/L — ABNORMAL LOW (ref 3.5–5.1)
SODIUM: 139 mmol/L (ref 135–145)

## 2016-02-27 LAB — CBC
HCT: 37.5 % (ref 35.0–47.0)
Hemoglobin: 12.7 g/dL (ref 12.0–16.0)
MCH: 30.9 pg (ref 26.0–34.0)
MCHC: 33.9 g/dL (ref 32.0–36.0)
MCV: 91 fL (ref 80.0–100.0)
PLATELETS: 240 10*3/uL (ref 150–440)
RBC: 4.12 MIL/uL (ref 3.80–5.20)
RDW: 15.1 % — AB (ref 11.5–14.5)
WBC: 6 10*3/uL (ref 3.6–11.0)

## 2016-02-27 LAB — TROPONIN I

## 2016-02-27 LAB — FIBRIN DERIVATIVES D-DIMER (ARMC ONLY): FIBRIN DERIVATIVES D-DIMER (ARMC): 1102 — AB (ref 0–499)

## 2016-02-27 MED ORDER — IOPAMIDOL (ISOVUE-370) INJECTION 76%
75.0000 mL | Freq: Once | INTRAVENOUS | Status: AC | PRN
  Administered 2016-02-27: 75 mL via INTRAVENOUS

## 2016-02-27 NOTE — Discharge Instructions (Signed)

## 2016-02-27 NOTE — ED Notes (Signed)
Patient brought in by [facility] from work, patient states that this morning while driving to work she felt was if her breathing was labored, her heart was racing, and she felt weak all over. Patient has hx/o recent MI. Patient had 1 stent placed. Patient does not appear to be in any acute distress at this time. Patient denies any chest pain.

## 2016-02-27 NOTE — ED Provider Notes (Addendum)
Time Seen: Approximately 0 9:15 I have reviewed the triage notes  Chief Complaint: Shortness of Breath and Weakness   History of Present Illness: Leah Espinoza is a 60 y.o. female who presents with an episode of while she was trying to work to feelings of heart palpitations like her heart was racing and felt very lightheaded. She denies any chest pain though she had some mild shortness of breath. Patient's had a recent myocardial infarction and had a single stent placement. She denies any changes in her medication has been taking her medications as prescribed. She denies any new calf tenderness or swelling she denies any abdominal pain. She denies any arm, jaw, back discomfort. She states it felt like her heart was racing and she was able to drive to work and then had an episode while she was at work where she felt lightheaded and EMS was notified. She denies any carbon monoxide exposure or actual loss of consciousness.   Past Medical History  Diagnosis Date  . History of chicken pox   . History of fainting   . Hyperlipidemia   . Hypertension   . MI (myocardial infarction) [facility])     Patient Active Problem List   Diagnosis Date Noted  . ST elevation myocardial infarction (STEMI) of inferior wall, initial episode of care ([place]) 12/23/2015  . Chest tightness 12/14/2015  . Health care maintenance 12/09/2014  . Stress 08/05/2014  . Obesity (BMI 30-39.9) 08/05/2014  . Rectal bleeding 11/18/2013  . Family history of colonic polyps 11/18/2013  . Hypercholesterolemia 11/18/2013  . Elevated blood pressure 11/18/2013    Past Surgical History  Procedure Laterality Date  . Cardiac catheterization N/A 12/23/2015    Procedure: Left Heart Cath and Coronary Angiography;  Surgeon: Isaias Cowman, MD;  Location: [place] [facility];  Service: Cardiovascular;  Laterality: N/A;  . Cardiac catheterization N/A 12/23/2015    Procedure: Coronary Stent Intervention;  Surgeon: Isaias Cowman,  MD;  Location: [place] [facility];  Service: Cardiovascular;  Laterality: N/A;    Past Surgical History  Procedure Laterality Date  . Cardiac catheterization N/A 12/23/2015    Procedure: Left Heart Cath and Coronary Angiography;  Surgeon: Isaias Cowman, MD;  Location: [place] [facility];  Service: Cardiovascular;  Laterality: N/A;  . Cardiac catheterization N/A 12/23/2015    Procedure: Coronary Stent Intervention;  Surgeon: Isaias Cowman, MD;  Location: [place] [facility];  Service: Cardiovascular;  Laterality: N/A;    Current Outpatient Rx  Name  Route  Sig  Dispense  Refill  . aspirin EC 81 MG EC tablet   Oral   Take 1 tablet (81 mg total) by mouth daily.   30 tablet   5   . metoprolol tartrate (LOPRESSOR) 25 MG tablet   Oral   Take 0.5 tablets (12.5 mg total) by mouth 2 (two) times daily.   60 tablet   5   . pravastatin (PRAVACHOL) 40 MG tablet   Oral   Take 1 tablet (40 mg total) by mouth daily.   90 tablet   1   . ticagrelor (BRILINTA) 90 MG TABS tablet   Oral   Take 1 tablet (90 mg total) by mouth 2 (two) times daily.   60 tablet   5     Allergies:  Review of patient's allergies indicates no known allergies.  Family History: Family History  Problem Relation Age of Onset  . Hyperlipidemia Mother   . Heart disease Mother   . Cancer Father  lung  . Sudden death Brother   . Arthritis Maternal Grandmother     Social History: Social History  Substance Use Topics  . Smoking status: Former Research scientist (life sciences)  . Smokeless tobacco: Never Used  . Alcohol Use: No     Review of Systems:   10 point review of systems was performed and was otherwise negative:  Constitutional: No fever Eyes: No visual disturbances ENT: No sore throat, ear pain Cardiac: No chest pain Respiratory: Mild shortness of breath, wheezing, or stridor Abdomen: No abdominal pain, no vomiting, No diarrhea Endocrine: No weight loss, No night sweats Extremities: No peripheral  edema, cyanosis Skin: No rashes, easy bruising Neurologic: No focal weakness, trouble with speech or swollowing Urologic: No dysuria, Hematuria, or urinary frequency   Physical Exam:  ED Triage Vitals  Enc Vitals Group     BP 02/27/16 0840 144/91 mmHg     Pulse Rate 02/27/16 0840 67     Resp 02/27/16 0840 16     Temp 02/27/16 0840 97.8 F (36.6 C)     Temp Source 02/27/16 0840 Oral     SpO2 02/27/16 0840 100 %     Weight 02/27/16 0840 142 lb 14.4 oz (64.819 kg)     Height 02/27/16 0840 5\' 1"  (1.549 m)     Head Cir --      Peak Flow --      Pain Score 02/27/16 0843 0     Pain Loc --      Pain Edu? --      Excl. in [place]? --     General: Awake , Alert , and Oriented times 3; GCS 15 Head: Normal cephalic , atraumatic Eyes: Pupils equal , round, reactive to light Nose/Throat: No nasal drainage, patent upper airway without erythema or exudate.  Neck: Supple, Full range of motion, No anterior adenopathy or palpable thyroid masses Lungs: Clear to ascultation without wheezes , rhonchi, or rales Heart: Regular rate, regular rhythm without murmurs , gallops , or rubs Abdomen: Soft, non tender without rebound, guarding , or rigidity; bowel sounds positive and symmetric in all 4 quadrants. No organomegaly .        Extremities: 2 plus symmetric pulses. No edema, clubbing or cyanosis Neurologic: normal ambulation, Motor symmetric without deficits, sensory intact Skin: warm, dry, no rashes   Labs:   All laboratory work was reviewed including any pertinent negatives or positives listed below:  Labs Reviewed  BASIC METABOLIC PANEL - Abnormal; Notable for the following:    Potassium 3.4 (*)    CO2 20 (*)    Glucose, Bld 116 (*)    Creatinine, Ser 1.03 (*)    GFR calc non Af Amer 57 (*)    All other components within normal limits  CBC - Abnormal; Notable for the following:    RDW 15.1 (*)    All other components within normal limits  FIBRIN DERIVATIVES D-DIMER ([facility] ONLY) - Abnormal;  Notable for the following:    Fibrin derivatives D-dimer [facility]) 1102 (*)    All other components within normal limits  TROPONIN I  Review of the laboratory work shows no abnormalities.    EKG:  ED ECG REPORT I, Daymon Larsen, the attending physician, personally viewed and interpreted this ECG.  Date: 02/27/2016 EKG Time: 0 840 Rate: 60 Rhythm: normal sinus rhythm QRS Axis: Left axis deviation Intervals: normal ST/T Wave abnormalities: normal Conduction Disturbances: none Narrative Interpretation: unremarkable No acute ischemic changes EKG shows improvement and inverted T  waves noticed in April  Radiology:    Final result by Rad Results In Interface (02/27/16 11:16:13)   Narrative:   CLINICAL DATA: Onset of weakness, labored breathing and heart racing this morning.  EXAM: CT ANGIOGRAPHY CHEST WITH CONTRAST  TECHNIQUE: Multidetector CT imaging of the chest was performed using the standard protocol during bolus administration of intravenous contrast. Multiplanar CT image reconstructions and MIPs were obtained to evaluate the vascular anatomy.  CONTRAST: 75 cc Isovue 370  COMPARISON: PA and lateral chest earlier today.  FINDINGS: No pulmonary embolus is identified. There is no axillary, hilar or mediastinal lymphadenopathy. Heart size is upper normal. No pleural or pericardial effusion. A few scattered aortic atherosclerotic calcifications are identified. The lungs are clear.  Imaged upper abdomen is unremarkable. Imaged bones are also unremarkable.  Review of the MIP images confirms the above findings.  IMPRESSION: Negative for pulmonary embolus or acute disease.  Scattered aortic atherosclerosis.   Electronically Signed By: Inge Rise M.D. On: 02/27/2016 11:16          DG Chest 2 View (Final result) Result time: 02/27/16 09:14:16   Final result by Rad Results In Interface (02/27/16 09:14:16)   Narrative:   CLINICAL DATA: PVCs.  Heart racing.  EXAM: CHEST 2 VIEW  COMPARISON: None.  FINDINGS: Normal heart size and mediastinal contours. Left coronary stent noted No acute infiltrate or edema. No effusion or pneumothorax. No osseous findings.  IMPRESSION: No evidence of active disease.   Electronically Signed By: Monte Fantasia M.D. On: 02/27/2016 09:14          I personally reviewed the radiologic studies     ED Course:  Patient's stay here was essentially asymptomatic. She essentially felt fine by the time she arrived here to the emergency department. I don't visualize any acute ischemic changes in her EKG at this time and her heart enzyme is negative. Patient had a chest CT which was negative. Patient may have had an anxiety attack based on her presentation though it did make phone contact with her cardiologist. She may need further outpatient testing such as a treadmill test or Holter monitor testing.    Assessment:  Near syncope Heart palpitations**   Final Clinical Impression:  Final diagnoses:  Shortness of breath     Plan:  Outpatient Continue current medications Patient was advised to return immediately if condition worsens. Patient was advised to follow up with their primary care physician or other specialized physicians involved in their outpatient care. The patient and/or family member/power of attorney had laboratory results reviewed at the bedside. All questions and concerns were addressed and appropriate discharge instructions were distributed by the nursing staff.            Daymon Larsen, MD 02/27/16 1359  Daymon Larsen, MD 02/27/16 1400

## 2016-03-01 DIAGNOSIS — Z955 Presence of coronary angioplasty implant and graft: Secondary | ICD-10-CM | POA: Diagnosis not present

## 2016-03-01 NOTE — Progress Notes (Signed)
Daily Session Note  Patient Details  Name: Leah Espinoza MRN: [id number] Date of Birth: April 08, 1956 Referring Provider:    Encounter Date: 03/01/2016  Check In:     Session Check In - 03/01/16 1633    Check-In   Location [facility]   Staff Present Earlean Shawl, BS, ACSM CEP, Exercise Physiologist;Doyt Castellana Oletta Darter, BA, ACSM CEP, Exercise Physiologist;Diane Joya Gaskins, RN, BSN   Supervising physician immediately available to respond to emergencies See telemetry face sheet for immediately available ER MD   Medication changes reported     No   Fall or balance concerns reported    No   Warm-up and Cool-down Performed on first and last piece of equipment   Resistance Training Performed Yes   VAD Patient? No   Pain Assessment   Currently in Pain? No/denies   Multiple Pain Sites No         Goals Met:  Independence with exercise equipment Exercise tolerated well No report of cardiac concerns or symptoms Strength training completed today  Goals Unmet:  Not Applicable  Comments: Pt able to follow exercise prescription today without complaint.  Will continue to monitor for progression.   Dr. Emily Filbert is Medical Director for [facility].

## 2016-03-03 ENCOUNTER — Encounter: Payer: Self-pay | Admitting: *Deleted

## 2016-03-03 DIAGNOSIS — Z955 Presence of coronary angioplasty implant and graft: Secondary | ICD-10-CM | POA: Diagnosis not present

## 2016-03-03 NOTE — Progress Notes (Signed)
Cardiac Individual Treatment Plan  Patient Details  Name: Leah Espinoza MRN: [id number] Date of Birth: 02-09-1956 Referring Provider:    Initial Encounter Date:       Cardiac Rehab from 01/22/2016 in [facility] Cardiac and Pulmonary Rehab   Date  01/22/16      Visit Diagnosis: S/P coronary artery stent placement  Patient's Home Medications on Admission:  Current outpatient prescriptions:  .  aspirin EC 81 MG EC tablet, Take 1 tablet (81 mg total) by mouth daily., Disp: 30 tablet, Rfl: 5 .  metoprolol tartrate (LOPRESSOR) 25 MG tablet, Take 0.5 tablets (12.5 mg total) by mouth 2 (two) times daily., Disp: 60 tablet, Rfl: 5 .  pravastatin (PRAVACHOL) 40 MG tablet, Take 1 tablet (40 mg total) by mouth daily., Disp: 90 tablet, Rfl: 1 .  ticagrelor (BRILINTA) 90 MG TABS tablet, Take 1 tablet (90 mg total) by mouth 2 (two) times daily., Disp: 60 tablet, Rfl: 5  Past Medical History: Past Medical History  Diagnosis Date  . History of chicken pox   . History of fainting   . Hyperlipidemia   . Hypertension   . MI (myocardial infarction) ([place])     Tobacco Use: History  Smoking status  . Former Smoker  Smokeless tobacco  . Never Used    Labs: Recent [employer] for ITP Cardiac and Pulmonary Rehab Latest Ref Rng 11/27/2014 04/04/2015 12/09/2015 02/17/2016   Cholestrol 100 - 199 mg/dL 244(A) 288(A) 284(H) 234(H)   LDLCALC 0 - 99 mg/dL 172 208 212(H) 162(H)   HDL >39 mg/dL 47 60 54 52   Trlycerides 0 - 149 mg/dL 127 101 89 102       Exercise Target Goals:    Exercise Program Goal: Individual exercise prescription set with THRR, safety & activity barriers. Participant demonstrates ability to understand and report RPE using BORG scale, to self-measure pulse accurately, and to acknowledge the importance of the exercise prescription.  Exercise Prescription Goal: Starting with aerobic activity 30 plus minutes a day, 3 days per week for initial exercise prescription.  Provide home exercise prescription and guidelines that participant acknowledges understanding prior to discharge.  Activity Barriers & Risk Stratification:     Activity Barriers & Cardiac Risk Stratification - 01/22/16 1420    Activity Barriers & Cardiac Risk Stratification   Activity Barriers None   Cardiac Risk Stratification High      6 Minute Walk:     6 Minute Walk      01/22/16 1446       6 Minute Walk   Phase Initial     Distance 1400 feet     Walk Time 6 minutes     # of Rest Breaks 0     MPH 2.7     RPE 12     Symptoms No     Resting HR 59 bpm     Resting BP 122/62 mmHg     Max Ex. HR 99 bpm     Max Ex. BP 132/70 mmHg        Initial Exercise Prescription:     Initial Exercise Prescription - 01/22/16 1400    Date of Initial Exercise RX and Referring Provider   Date 01/22/16   Treadmill   MPH 2   Grade 0   Minutes 15   Recumbant Bike   Level 2   Watts 20   Minutes 15   NuStep   Level 2   Watts 40  Minutes 15   Cybex   Level 2   RPM 50   Minutes 15   Recumbant Elliptical   Level 1   Watts 10   Minutes 15   Elliptical   Level 1   Speed 3   Minutes 5   REL-XR   Level 2   Watts 50   Minutes 15   T5 Nustep   Level 2   Watts 40   Minutes 15   Biostep-RELP   Level 2   Watts 25   Minutes 15   Prescription Details   Frequency (times per week) 3   Duration Progress to 50 minutes of aerobic without signs/symptoms of physical distress   Intensity   THRR REST +  30   Ratings of Perceived Exertion 11-13   Perceived Dyspnea 0-4   Progression   Progression Continue to progress workloads to maintain intensity without signs/symptoms of physical distress.   Resistance Training   Training Prescription Yes   Weight 2   Reps 10-15      Perform Capillary Blood Glucose checks as needed.  Exercise Prescription Changes:     Exercise Prescription Changes      01/27/16 1400 02/12/16 1600 02/26/16 1300       Exercise Review    Progression Yes Yes Yes     Response to Exercise   Blood Pressure (Admit) 128/70 mmHg  106/72 mmHg     Blood Pressure (Exercise) 158/74 mmHg  152/70 mmHg     Blood Pressure (Exit) 110/64 mmHg  114/66 mmHg     Heart Rate (Admit) 57 bpm  73 bpm     Heart Rate (Exercise) 102 bpm  106 bpm     Heart Rate (Exit) 66 bpm  67 bpm     Rating of Perceived Exertion (Exercise) 12  12     Symptoms No  no     Duration Progress to 45 minutes of aerobic exercise without signs/symptoms of physical distress Progress to 45 minutes of aerobic exercise without signs/symptoms of physical distress      Intensity Rest + 30 THRR New THRR unchanged     Progression   Progression Continue to progress workloads to maintain intensity without signs/symptoms of physical distress. Continue to progress workloads to maintain intensity without signs/symptoms of physical distress.  109-140 Continue to progress workloads to maintain intensity without signs/symptoms of physical distress.     Resistance Training   Training Prescription Yes Yes Yes     Weight _0 Reps 10-15 10-15 10-15     Interval Training   Interval Training No No Yes     Treadmill   MPH 2 3.2 3.2     Grade 0 2 2     Minutes _1 Recumbant Elliptical   Level 1 1      Watts 12 12      Minutes 15 15      REL-XR   Level   2     Watts   70     Minutes   15        Exercise Comments:     Exercise Comments      01/22/16 1452 01/27/16 1431 02/26/16 1317       Exercise Comments Initial Target heart rate will be rest + 30, monitoring for symptoms. (Demonstrates 75% blockage of circumflex and LAD arteries) Leah Espinoza recently started Heart Track and is developing familiarity with exercise equipment  and routine.  She has had no signs of difficulty or distress during sessions. Leah Espinoza continues to tolerate exercise wel with no symptoms.        Discharge Exercise Prescription (Final Exercise Prescription Changes):     Exercise Prescription Changes  - 02/26/16 1300    Exercise Review   Progression Yes   Response to Exercise   Blood Pressure (Admit) 106/72 mmHg   Blood Pressure (Exercise) 152/70 mmHg   Blood Pressure (Exit) 114/66 mmHg   Heart Rate (Admit) 73 bpm   Heart Rate (Exercise) 106 bpm   Heart Rate (Exit) 67 bpm   Rating of Perceived Exertion (Exercise) 12   Symptoms no   Intensity THRR unchanged   Progression   Progression Continue to progress workloads to maintain intensity without signs/symptoms of physical distress.   Resistance Training   Training Prescription Yes   Weight 4   Reps 10-15   Interval Training   Interval Training Yes   Treadmill   MPH 3.2   Grade 2   Minutes 22   REL-XR   Level 2   Watts 70   Minutes 15      Nutrition:  Target Goals: Understanding of nutrition guidelines, daily intake of sodium <1568m, cholesterol <2064m calories 30% from fat and 7% or less from saturated fats, daily to have 5 or more servings of fruits and vegetables.  Biometrics:     Pre Biometrics - 01/22/16 1439    Pre Biometrics   Height 5' 1.2" (1.554 m)   Weight 146 lb 3.2 oz (66.316 kg)   Waist Circumference 33.5 inches   Hip Circumference 42.5 inches   Waist to Hip Ratio 0.79 %   BMI (Calculated) 27.5       Nutrition Therapy Plan and Nutrition Goals:     Nutrition Therapy & Goals - 02/19/16 1801    Nutrition Therapy   Diet approx. 1200kcal following DASH guidelines   Protein (specify units) 5-6oz   Fiber 20 grams   Whole Grain Foods 3 servings   Saturated Fats 10 max. grams   Fruits and Vegetables 5 servings/day  ideally 8-10 servings daily   Sodium 1500 grams   Personal Nutrition Goals   Personal Goal #1 Continue your current healthy eating pattern   Personal Goal #2 Use menus (provided) for increasing variety    Comments Ival reports making significant diet changes starting about 2 weeks prior to her heart attack. She has lost about 14lbs so far.    Intervention Plan   Intervention  Prescribe, educate and counsel regarding individualized specific dietary modifications aiming towards targeted core components such as weight, hypertension, lipid management, diabetes, heart failure and other comorbidities.;Nutrition handout(s) given to patient.   Expected Outcomes Short Term Goal: Understand basic principles of dietary content, such as calories, fat, sodium, cholesterol and nutrients.;Short Term Goal: A plan has been developed with personal nutrition goals set during dietitian appointment.;Long Term Goal: Adherence to prescribed nutrition plan.      Nutrition Discharge: Rate Your Plate Scores:     Nutrition Assessments - 01/26/16 1201    Rate Your Plate Scores   Pre Score 63   Pre Score % 70 %      Nutrition Goals Re-Evaluation:   Psychosocial: Target Goals: Acknowledge presence or absence of depression, maximize coping skills, provide positive support system. Participant is able to verbalize types and ability to use techniques and skills needed for reducing stress and depression.  Initial Review & Psychosocial Screening:  Initial Psych Review & Screening - 01/22/16 1534    Initial Review   Comments Tisheena mentioned she has "irritable bowel".       Quality of Life Scores:     Quality of Life - 01/22/16 1418    Quality of Life Scores   Health/Function Pre 17.6 %   Socioeconomic Pre 24.57 %   Psych/Spiritual Pre 18.71 %   Family Pre 20.1 %   GLOBAL Pre 19.63 %      PHQ-9:     Recent Review Flowsheet Data    Depression screen [facility] 2/9 01/22/2016 04/08/2015 11/14/2013   Decreased Interest 0 0 0   Down, Depressed, Hopeless 0 0 0   PHQ - 2 Score 0 0 0   Altered sleeping 1 - -   Tired, decreased energy 2 - -   Change in appetite 1 - -   Feeling bad or failure about yourself  0 - -   Trouble concentrating 0 - -   Moving slowly or fidgety/restless 0 - -   Suicidal thoughts 0 - -   PHQ-9 Score 4 - -   Difficult doing work/chores Not difficult at all - -       Psychosocial Evaluation and Intervention:     Psychosocial Evaluation - 01/26/16 1656    Psychosocial Evaluation & Interventions   Interventions Encouraged to exercise with the program and follow exercise prescription;Stress management education   Comments Counselor met with Ms. Potenza today for initial psychosocial evaluation.  She is a 60 year old who had a heart attack on 4/11 with a stent insertion.  She has a strong support system with a spouse of 61 years and several adult children who live close by.  Ms. Bidwell reports she is in good health overall with no sleeping or appetite problems.  She denies a history of depression or anxiety or any current symptoms.  Although she is typically in a positive mood, she states she has stressors of her job and her health.  Ms. Kight has goals to be more active and be healthier overall.  Counselor will continue to follow with her while in this program.     Continued Psychosocial Services Needed Yes  Ms. Njoku will benefit from the psychoeducational components of this program - particularly stress management.        Psychosocial Re-Evaluation:     Psychosocial Re-Evaluation      02/02/16 1727           Psychosocial Re-Evaluation   Comments Counselor follow up with Ms. Casseus reporting she is experiencing some progress in her goals with increased stamina since beginning this program several weeks ago.  She was able to go back to work full time today.  She maintains sleeping well and positive mood.  Her stress is being managed better both at work and she states she feels better about her recent concerns about her health.            Vocational Rehabilitation: Provide vocational rehab assistance to qualifying candidates.   Vocational Rehab Evaluation & Intervention:     Vocational Rehab - 01/22/16 1420    Initial Vocational Rehab Evaluation & Intervention   Assessment shows need for Vocational Rehabilitation No       Education: Education Goals: Education classes will be provided on a weekly basis, covering required topics. Participant will state understanding/return demonstration of topics presented.  Learning Barriers/Preferences:     Learning Barriers/Preferences - 01/22/16 1420    Learning Barriers/Preferences  Learning Barriers None   Learning Preferences None      Education Topics: General Nutrition Guidelines/Fats and Fiber: -Group instruction provided by verbal, written material, models and posters to present the general guidelines for heart healthy nutrition. Gives an explanation and review of dietary fats and fiber.          Cardiac Rehab from 03/01/2016 in [facility] Cardiac and Pulmonary Rehab   Date  02/16/16   Educator  PI   Instruction Review Code  2- meets goals/outcomes      Controlling Sodium/Reading Food Labels: -Group verbal and written material supporting the discussion of sodium use in heart healthy nutrition. Review and explanation with models, verbal and written materials for utilization of the food label.      Cardiac Rehab from 03/01/2016 in [facility] Cardiac and Pulmonary Rehab   Date  02/23/16   Educator  PI   Instruction Review Code  2- meets goals/outcomes      Exercise Physiology & Risk Factors: - Group verbal and written instruction with models to review the exercise physiology of the cardiovascular system and associated critical values. Details cardiovascular disease risk factors and the goals associated with each risk factor.      Cardiac Rehab from 03/01/2016 in [facility] Cardiac and Pulmonary Rehab   Date  02/25/16   Educator  AS   Instruction Review Code  2- meets goals/outcomes      Aerobic Exercise & Resistance Training: - Gives group verbal and written discussion on the health impact of inactivity. On the components of aerobic and resistive training programs and the benefits of this training and how to safely progress through these programs.      Cardiac  Rehab from 03/01/2016 in [facility] Cardiac and Pulmonary Rehab   Date  03/01/16   Educator  [facility]   Instruction Review Code  2- meets goals/outcomes      Flexibility, Balance, General Exercise Guidelines: - Provides group verbal and written instruction on the benefits of flexibility and balance training programs. Provides general exercise guidelines with specific guidelines to those with heart or lung disease. Demonstration and skill practice provided.   Stress Management: - Provides group verbal and written instruction about the health risks of elevated stress, cause of high stress, and healthy ways to reduce stress.   Depression: - Provides group verbal and written instruction on the correlation between heart/lung disease and depressed mood, treatment options, and the stigmas associated with seeking treatment.      Cardiac Rehab from 03/01/2016 in [facility] Cardiac and Pulmonary Rehab   Date  02/11/16   Educator  Kathreen Cornfield, [facility]   Instruction Review Code  2- meets goals/outcomes      Anatomy & Physiology of the Heart: - Group verbal and written instruction and models provide basic cardiac anatomy and physiology, with the coronary electrical and arterial systems. Review of: AMI, Angina, Valve disease, Heart Failure, Cardiac Arrhythmia, Pacemakers, and the ICD.   Cardiac Procedures: - Group verbal and written instruction and models to describe the testing methods done to diagnose heart disease. Reviews the outcomes of the test results. Describes the treatment choices: Medical Management, Angioplasty, or Coronary Bypass Surgery.   Cardiac Medications: - Group verbal and written instruction to review commonly prescribed medications for heart disease. Reviews the medication, class of the drug, and side effects. Includes the steps to properly store meds and maintain the prescription regimen.      Cardiac Rehab from 03/01/2016 in [facility] Cardiac and Pulmonary Rehab   Date  01/28/16   Educator  DW    Instruction Review Code  2- meets goals/outcomes      Go Sex-Intimacy & Heart Disease, Get SMART - Goal Setting: - Group verbal and written instruction through game format to discuss heart disease and the return to sexual intimacy. Provides group verbal and written material to discuss and apply goal setting through the application of the S.M.A.R.T. Method.   Other Matters of the Heart: - Provides group verbal, written materials and models to describe Heart Failure, Angina, Valve Disease, and Diabetes in the realm of heart disease. Includes description of the disease process and treatment options available to the cardiac patient.   Exercise & Equipment Safety: - Individual verbal instruction and demonstration of equipment use and safety with use of the equipment.      Cardiac Rehab from 03/01/2016 in [facility] Cardiac and Pulmonary Rehab   Date  01/22/16   Educator  C. Enterkin RN   Instruction Review Code  1- partially meets, needs review/practice      Infection Prevention: - Provides verbal and written material to individual with discussion of infection control including proper hand washing and proper equipment cleaning during exercise session.      Cardiac Rehab from 03/01/2016 in [facility] Cardiac and Pulmonary Rehab   Date  01/22/16   Educator  C. EnterkinRN   Instruction Review Code  2- meets goals/outcomes      Falls Prevention: - Provides verbal and written material to individual with discussion of falls prevention and safety.      Cardiac Rehab from 03/01/2016 in [facility] Cardiac and Pulmonary Rehab   Date  01/22/16   Educator  C. [place]   Instruction Review Code  2- meets goals/outcomes      Diabetes: - Individual verbal and written instruction to review signs/symptoms of diabetes, desired ranges of glucose level fasting, after meals and with exercise. Advice that pre and post exercise glucose checks will be done for 3 sessions at entry of program.    Knowledge Questionnaire  Score:     Knowledge Questionnaire Score - 01/22/16 1420    Knowledge Questionnaire Score   Pre Score 25      Core Components/Risk Factors/Patient Goals at Admission:     Personal Goals and Risk Factors at Admission - 01/22/16 1421    Core Components/Risk Factors/Patient Goals on Admission   Sedentary Yes   Intervention Provide advice, education, support and counseling about physical activity/exercise needs.;Develop an individualized exercise prescription for aerobic and resistive training based on initial evaluation findings, risk stratification, comorbidities and participant's personal goals.   Expected Outcomes Achievement of increased cardiorespiratory fitness and enhanced flexibility, muscular endurance and strength shown through measurements of functional capacity and personal statement of participant.   Hypertension Yes   Intervention Provide education on lifestyle modifcations including regular physical activity/exercise, weight management, moderate sodium restriction and increased consumption of fresh fruit, vegetables, and low fat dairy, alcohol moderation, and smoking cessation.;Monitor prescription use compliance.   Expected Outcomes Short Term: Continued assessment and intervention until BP is < 140/27m HG in hypertensive participants. < 130/838mHG in hypertensive participants with diabetes, heart failure or chronic kidney disease.;Long Term: Maintenance of blood pressure at goal levels.   Lipids Yes   Intervention Provide education and support for participant on nutrition & aerobic/resistive exercise along with prescribed medications to achieve LDL <7066mHDL >33m4m Expected Outcomes Short Term: Participant states understanding of desired cholesterol values and is compliant with medications prescribed. Participant is following  exercise prescription and nutrition guidelines.;Long Term: Cholesterol controlled with medications as prescribed, with individualized exercise RX and  with personalized nutrition plan. Value goals: LDL < 55m, HDL > 40 mg.   Stress Yes   Intervention Offer individual and/or small group education and counseling on adjustment to heart disease, stress management and health-related lifestyle change. Teach and support self-help strategies.;Refer participants experiencing significant psychosocial distress to appropriate mental health specialists for further evaluation and treatment. When possible, include family members and significant others in education/counseling sessions.   Expected Outcomes Short Term: Participant demonstrates changes in health-related behavior, relaxation and other stress management skills, ability to obtain effective social support, and compliance with psychotropic medications if prescribed.;Long Term: Emotional wellbeing is indicated by absence of clinically significant psychosocial distress or social isolation.      Core Components/Risk Factors/Patient Goals Review:      Goals and Risk Factor Review      02/19/16 1428 02/23/16 1727         Core Components/Risk Factors/Patient Goals Review   Personal Goals Review  Hypertension;Lipids;Stress;Weight Management/Obesity      Review Tzirel has attended sessions regularly and has increased her treadmill walk speed.  She wants to continue to work on improving her fitness level. Zarea's personal goals were reviewed today. She stated she is losing about a pound a week. She has been very consistant with her 3 times a week of exercise in Cardiac Rehab. She has continued to increase workloads and has felt an increase in her energy and stamina. She has just gotten lab work done and she reports her cholesterol has decreased from 289-234 but her doctor is going to continue to work with her meds to keep brining it down.  Her blood pressure has been well managed with meds and life style changes. Her stress has been at good levels this week. She stated work sometimes causes stress, but has felt like  she has been able to handle work stress well this week.       Expected Outcomes Allee will continue exercise prescription to acheive her goals. Indea would continue to progress with her exercise levels and healthy eating habits in combination with medication management to continue to bring down her cholesterol, maintain her blood pressure, and keep progressing with her weight loss goal. An extra day of exercise was recommended outside of class to assist with continued progression.          Core Components/Risk Factors/Patient Goals at Discharge (Final Review):      Goals and Risk Factor Review - 02/23/16 1727    Core Components/Risk Factors/Patient Goals Review   Personal Goals Review Hypertension;Lipids;Stress;Weight Management/Obesity   Review Haeven's personal goals were reviewed today. She stated she is losing about a pound a week. She has been very consistant with her 3 times a week of exercise in Cardiac Rehab. She has continued to increase workloads and has felt an increase in her energy and stamina. She has just gotten lab work done and she reports her cholesterol has decreased from 289-234 but her doctor is going to continue to work with her meds to keep brining it down.  Her blood pressure has been well managed with meds and life style changes. Her stress has been at good levels this week. She stated work sometimes causes stress, but has felt like she has been able to handle work stress well this week.    Expected Outcomes Mercie would continue to progress with her exercise levels and healthy eating habits  in combination with medication management to continue to bring down her cholesterol, maintain her blood pressure, and keep progressing with her weight loss goal. An extra day of exercise was recommended outside of class to assist with continued progression.       ITP Comments:     ITP Comments      02/01/16 1134 02/18/16 1648 03/03/16 0906       ITP Comments 30 day review. Continue with  ITP.  New to program  3 visits since orientation on 5/11 Rickelle said the class time change to 4pm instead of 3:45pm helps her with her work schedule. 30 day review.  Continue with ITP        Comments:

## 2016-03-03 NOTE — Progress Notes (Signed)
Daily Session Note  Patient Details  Name: Leah Espinoza MRN: [id number] Date of Birth: November 04, 1955 Referring Provider:    Encounter Date: 03/03/2016  Check In:     Session Check In - 03/03/16 1617    Check-In   Location [facility]   Staff Present Jeanell Sparrow, DPT, Burlene Arnt, BA, ACSM CEP, Exercise Physiologist;Diane Joya Gaskins, RN, BSN   Supervising physician immediately available to respond to emergencies See telemetry face sheet for immediately available ER MD   Medication changes reported     Yes   Comments Metoprolol changed to 25 mg bid po   Fall or balance concerns reported    No   Resistance Training Performed Yes   VAD Patient? No   Pain Assessment   Currently in Pain? No/denies   Multiple Pain Sites No         Goals Met:  Independence with exercise equipment Exercise tolerated well No report of cardiac concerns or symptoms  Goals Unmet:  Not Applicable  Comments: Patient completed exercise prescription and all exercise goals during rehab session. The exercise was tolerated well and the patient is progressing in the program.    Dr. Emily Filbert is Medical Director for [place] and [facility].

## 2016-03-04 ENCOUNTER — Encounter: Payer: Managed Care, Other (non HMO) | Admitting: *Deleted

## 2016-03-04 DIAGNOSIS — Z955 Presence of coronary angioplasty implant and graft: Secondary | ICD-10-CM

## 2016-03-04 NOTE — Progress Notes (Signed)
Daily Session Note  Patient Details  Name: STEWART PIMENTA MRN: [id number] Date of Birth: 06/22/1956 Referring Provider:    Encounter Date: 03/04/2016  Check In:     Session Check In - 03/04/16 1704    Check-In   Location [facility]   Staff Present Gerlene Burdock, RN, Vickki Hearing, BA, ACSM CEP, Exercise Physiologist;Diane Joya Gaskins, RN, BSN   Supervising physician immediately available to respond to emergencies See telemetry face sheet for immediately available ER MD   Medication changes reported     No   Fall or balance concerns reported    No   Warm-up and Cool-down Performed on first and last piece of equipment   Resistance Training Performed Yes   VAD Patient? No   Pain Assessment   Currently in Pain? No/denies         Goals Met:  Proper associated with RPD/PD & O2 Sat Exercise tolerated well No report of cardiac concerns or symptoms  Goals Unmet:  Not Applicable  Comments:     Dr. Emily Filbert is Medical Director for [place] and [facility].

## 2016-03-08 DIAGNOSIS — Z955 Presence of coronary angioplasty implant and graft: Secondary | ICD-10-CM | POA: Diagnosis not present

## 2016-03-08 NOTE — Progress Notes (Signed)
Daily Session Note  Patient Details  Name: Leah Espinoza MRN: [id number] Date of Birth: 04/04/1956 Referring Provider:    Encounter Date: 03/08/2016  Check In:     Session Check In - 03/08/16 1633    Check-In   Location [facility]   Staff Present Earlean Shawl, BS, ACSM CEP, Exercise Physiologist;Mishaal Lansdale Oletta Darter, BA, ACSM CEP, Exercise Physiologist;Diane Joya Gaskins, RN, BSN   Supervising physician immediately available to respond to emergencies See telemetry face sheet for immediately available ER MD   Medication changes reported     No   Fall or balance concerns reported    No   Warm-up and Cool-down Performed on first and last piece of equipment   Resistance Training Performed Yes   VAD Patient? No   Pain Assessment   Currently in Pain? No/denies         Goals Met:  Independence with exercise equipment Exercise tolerated well No report of cardiac concerns or symptoms Strength training completed today  Goals Unmet:  Not Applicable  Comments: Pt able to follow exercise prescription today without complaint.  Will continue to monitor for progression.    Dr. Emily Filbert is Medical Director for [place] and [facility].

## 2016-03-10 ENCOUNTER — Encounter: Payer: Managed Care, Other (non HMO) | Admitting: *Deleted

## 2016-03-10 DIAGNOSIS — Z955 Presence of coronary angioplasty implant and graft: Secondary | ICD-10-CM

## 2016-03-10 NOTE — Progress Notes (Signed)
Daily Session Note  Patient Details  Name: CLEOPATRA SARDO MRN: [id number] Date of Birth: 1955-10-14 Referring Provider:    Encounter Date: 03/10/2016  Check In:     Session Check In - 03/10/16 1623    Check-In   Location [facility]   Staff Present Heath Lark, RN, BSN, Lance Sell, BA, ACSM CEP, Exercise Physiologist;Francis Yardley Joya Gaskins, RN, BSN   Supervising physician immediately available to respond to emergencies See telemetry face sheet for immediately available ER MD   Medication changes reported     No   Fall or balance concerns reported    No   Warm-up and Cool-down Performed on first and last piece of equipment   Resistance Training Performed Yes   VAD Patient? No   Pain Assessment   Currently in Pain? No/denies         Goals Met:  Independence with exercise equipment Exercise tolerated well No report of cardiac concerns or symptoms Strength training completed today  Goals Unmet:  Not Applicable  Comments:  Pt able to follow exercise prescription today without complaint.  Will continue to monitor for progression.    Dr. Emily Filbert is Medical Director for [place] and [facility].

## 2016-03-11 DIAGNOSIS — Z955 Presence of coronary angioplasty implant and graft: Secondary | ICD-10-CM | POA: Diagnosis not present

## 2016-03-11 NOTE — Progress Notes (Signed)
Daily Session Note  Patient Details  Name: Leah Espinoza MRN: [id number] Date of Birth: Sep 21, 1955 Referring Provider:    Encounter Date: 03/11/2016  Check In:     Session Check In - 03/11/16 1659    Check-In   Location [facility]   Staff Present Heath Lark, RN, BSN, CCRP;Tomicka Lover, DPT, Burlene Arnt, BA, ACSM CEP, Exercise Physiologist   Supervising physician immediately available to respond to emergencies See telemetry face sheet for immediately available ER MD   Medication changes reported     No   Fall or balance concerns reported    No   Warm-up and Cool-down Performed on first and last piece of equipment   Resistance Training Performed Yes   VAD Patient? No   Pain Assessment   Currently in Pain? No/denies   Multiple Pain Sites No         Goals Met:  Independence with exercise equipment Exercise tolerated well No report of cardiac concerns or symptoms  Goals Unmet:  Not Applicable  Comments: Patient completed exercise prescription and all exercise goals during rehab session. The exercise was tolerated well and the patient is progressing in the program.    Dr. Emily Filbert is Medical Director for [place] and [facility].

## 2016-03-15 ENCOUNTER — Encounter: Payer: Managed Care, Other (non HMO) | Attending: Cardiology | Admitting: *Deleted

## 2016-03-15 DIAGNOSIS — Z955 Presence of coronary angioplasty implant and graft: Secondary | ICD-10-CM | POA: Diagnosis not present

## 2016-03-15 NOTE — Progress Notes (Signed)
Daily Session Note  Patient Details  Name: Leah Espinoza MRN: [id number] Date of Birth: 1956-07-06 Referring Provider:    Encounter Date: 03/15/2016  Check In:     Session Check In - 03/15/16 1707    Check-In   Location [facility]   Staff Present Earlean Shawl, BS, ACSM CEP, Exercise Physiologist;Amanda Oletta Darter, BA, ACSM CEP, Exercise Physiologist;Diane Joya Gaskins, RN, BSN   Supervising physician immediately available to respond to emergencies See telemetry face sheet for immediately available ER MD   Medication changes reported     No   Fall or balance concerns reported    No   Warm-up and Cool-down Performed on first and last piece of equipment   Resistance Training Performed Yes   VAD Patient? No   Pain Assessment   Currently in Pain? No/denies   Multiple Pain Sites No         Goals Met:  Proper associated with RPD/PD & O2 Sat Independence with exercise equipment Exercise tolerated well No report of cardiac concerns or symptoms Strength training completed today  Goals Unmet:  Not Applicable  Comments: Patient completed exercise prescription and all exercise goals during rehab session. The exercise was tolerated well and the patient is progressing in the program.     Dr. Emily Filbert is Medical Director for [place] and [facility].

## 2016-03-22 DIAGNOSIS — Z955 Presence of coronary angioplasty implant and graft: Secondary | ICD-10-CM | POA: Diagnosis not present

## 2016-03-22 NOTE — Progress Notes (Signed)
Daily Session Note  Patient Details  Name: Leah Espinoza MRN: [id number] Date of Birth: [birth year] Referring Provider:    Encounter Date: 03/22/2016  Check In:     Session Check In - 03/22/16 1621    Check-In   Location [facility]   Staff Present Earlean Shawl, BS, ACSM CEP, Exercise Physiologist;Amanda Oletta Darter, BA, ACSM CEP, Exercise Physiologist;Diane Joya Gaskins, RN, BSN   Supervising physician immediately available to respond to emergencies See telemetry face sheet for immediately available ER MD   Medication changes reported     No   Fall or balance concerns reported    No   Warm-up and Cool-down Performed on first and last piece of equipment   Resistance Training Performed Yes   VAD Patient? No   Pain Assessment   Currently in Pain? No/denies   Multiple Pain Sites No         Goals Met:  Independence with exercise equipment Exercise tolerated well No report of cardiac concerns or symptoms Strength training completed today  Goals Unmet:  Not Applicable  Comments:      6 Minute Walk      01/22/16 1446 03/22/16 1717     6 Minute Walk   Phase Initial Discharge    Distance 1400 feet 1822 feet    Distance % Change  23 %    Walk Time 6 minutes 6 minutes    # of Rest Breaks 0     MPH 2.7 3.45    METS  4.2    RPE 12 15    VO2 Peak  14.7    Symptoms No No    Resting HR 59 bpm 67 bpm    Resting BP 122/62 mmHg 112/58 mmHg    Max Ex. HR 99 bpm 105 bpm    Max Ex. BP 132/70 mmHg 118/78 mmHg          Dr. Emily Filbert is Medical Director for [place] and [facility].

## 2016-03-24 ENCOUNTER — Encounter: Payer: Managed Care, Other (non HMO) | Admitting: *Deleted

## 2016-03-24 DIAGNOSIS — Z955 Presence of coronary angioplasty implant and graft: Secondary | ICD-10-CM | POA: Diagnosis not present

## 2016-03-24 NOTE — Progress Notes (Signed)
Cardiac Individual Treatment Plan  Patient Details  Name: Leah Espinoza MRN: [id number] Date of Birth: May 04, 1956 Referring Provider:    Initial Encounter Date:       Cardiac Rehab from 01/22/2016 in [facility] Cardiac and Pulmonary Rehab   Date  01/22/16      Visit Diagnosis: S/P coronary artery stent placement  Patient's Home Medications on Admission:  Current outpatient prescriptions:  .  aspirin EC 81 MG EC tablet, Take 1 tablet (81 mg total) by mouth daily., Disp: 30 tablet, Rfl: 5 .  metoprolol tartrate (LOPRESSOR) 25 MG tablet, Take 0.5 tablets (12.5 mg total) by mouth 2 (two) times daily., Disp: 60 tablet, Rfl: 5 .  pravastatin (PRAVACHOL) 40 MG tablet, Take 1 tablet (40 mg total) by mouth daily., Disp: 90 tablet, Rfl: 1 .  ticagrelor (BRILINTA) 90 MG TABS tablet, Take 1 tablet (90 mg total) by mouth 2 (two) times daily., Disp: 60 tablet, Rfl: 5  Past Medical History: Past Medical History  Diagnosis Date  . History of chicken pox   . History of fainting   . Hyperlipidemia   . Hypertension   . MI (myocardial infarction) ([place])     Tobacco Use: History  Smoking status  . Former Smoker  Smokeless tobacco  . Never Used    Labs: Recent [employer] for ITP Cardiac and Pulmonary Rehab Latest Ref Rng 11/27/2014 04/04/2015 12/09/2015 02/17/2016   Cholestrol 100 - 199 mg/dL 244(A) 288(A) 284(H) 234(H)   LDLCALC 0 - 99 mg/dL 172 208 212(H) 162(H)   HDL >39 mg/dL 47 60 54 52   Trlycerides 0 - 149 mg/dL 127 101 89 102       Exercise Target Goals:    Exercise Program Goal: Individual exercise prescription set with THRR, safety & activity barriers. Participant demonstrates ability to understand and report RPE using BORG scale, to self-measure pulse accurately, and to acknowledge the importance of the exercise prescription.  Exercise Prescription Goal: Starting with aerobic activity 30 plus minutes a day, 3 days per week for initial exercise prescription.  Provide home exercise prescription and guidelines that participant acknowledges understanding prior to discharge.  Activity Barriers & Risk Stratification:     Activity Barriers & Cardiac Risk Stratification - 01/22/16 1420    Activity Barriers & Cardiac Risk Stratification   Activity Barriers None   Cardiac Risk Stratification High      6 Minute Walk:     6 Minute Walk      01/22/16 1446 03/22/16 1717     6 Minute Walk   Phase Initial Discharge    Distance 1400 feet 1822 feet    Distance % Change  23 %    Walk Time 6 minutes 6 minutes    # of Rest Breaks 0     MPH 2.7 3.45    METS  4.2    RPE 12 15    VO2 Peak  14.7    Symptoms No No    Resting HR 59 bpm 67 bpm    Resting BP 122/62 mmHg 112/58 mmHg    Max Ex. HR 99 bpm 105 bpm    Max Ex. BP 132/70 mmHg 118/78 mmHg       Initial Exercise Prescription:     Initial Exercise Prescription - 01/22/16 1400    Date of Initial Exercise RX and Referring Provider   Date 01/22/16   Treadmill   MPH 2   Grade 0   Minutes 15  Recumbant Bike   Level 2   Watts 20   Minutes 15   NuStep   Level 2   Watts 40   Minutes 15   Cybex   Level 2   RPM 50   Minutes 15   Recumbant Elliptical   Level 1   Watts 10   Minutes 15   Elliptical   Level 1   Speed 3   Minutes 5   REL-XR   Level 2   Watts 50   Minutes 15   T5 Nustep   Level 2   Watts 40   Minutes 15   Biostep-RELP   Level 2   Watts 25   Minutes 15   Prescription Details   Frequency (times per week) 3   Duration Progress to 50 minutes of aerobic without signs/symptoms of physical distress   Intensity   THRR REST +  30   Ratings of Perceived Exertion 11-13   Perceived Dyspnea 0-4   Progression   Progression Continue to progress workloads to maintain intensity without signs/symptoms of physical distress.   Resistance Training   Training Prescription Yes   Weight 2   Reps 10-15      Perform Capillary Blood Glucose checks as needed.  Exercise  Prescription Changes:     Exercise Prescription Changes      01/27/16 1400 02/12/16 1600 02/26/16 1300 03/09/16 1400 03/12/16 1000   Exercise Review   Progression Yes Yes Yes Yes Yes   Response to Exercise   Blood Pressure (Admit) 128/70 mmHg  106/72 mmHg 110/64 mmHg 114/74 mmHg   Blood Pressure (Exercise) 158/74 mmHg  152/70 mmHg 142/82 mmHg 138/68 mmHg   Blood Pressure (Exit) 110/64 mmHg  114/66 mmHg 112/72 mmHg 98/60 mmHg   Heart Rate (Admit) 57 bpm  73 bpm 73 bpm 76 bpm   Heart Rate (Exercise) 102 bpm  106 bpm 116 bpm 103 bpm   Heart Rate (Exit) 66 bpm  67 bpm 76 bpm 65 bpm   Rating of Perceived Exertion (Exercise) 12  12 13 14    Symptoms No  no NO no   Duration Progress to 45 minutes of aerobic exercise without signs/symptoms of physical distress Progress to 45 minutes of aerobic exercise without signs/symptoms of physical distress      Intensity Rest + 30 THRR New THRR unchanged THRR unchanged THRR unchanged   Progression   Progression Continue to progress workloads to maintain intensity without signs/symptoms of physical distress. Continue to progress workloads to maintain intensity without signs/symptoms of physical distress.  109-140 Continue to progress workloads to maintain intensity without signs/symptoms of physical distress. Continue to progress workloads to maintain intensity without signs/symptoms of physical distress. Continue to progress workloads to maintain intensity without signs/symptoms of physical distress.   Resistance Training   Training Prescription Yes Yes Yes Yes Yes   Weight 2 4 4 4 4    Reps 10-15 10-15 10-15 10-15 10-15   Interval Training   Interval Training No No Yes Yes Yes   Equipment     REL-XR  added 30 s intervals   Treadmill   MPH 2 3.2 3.2 3.4 3.4   Grade 0 2 2 2.5 3   Minutes 15 22 22 10   usually does 20 + 27   Recumbant Elliptical   Level 1 1      Watts 12 12      Minutes 15 15      REL-XR   Level   2 4  Watts   70 99    Minutes    15 20      03/24/16 1300 03/24/16 1700         Exercise Review   Progression Yes Yes      Response to Exercise   Blood Pressure (Admit) 112/58 mmHg 112/58 mmHg      Blood Pressure (Exercise) 118/78 mmHg 118/78 mmHg      Blood Pressure (Exit) 104/62 mmHg 104/62 mmHg      Heart Rate (Admit) 67 bpm 67 bpm      Heart Rate (Exercise) 117 bpm 117 bpm      Heart Rate (Exit) 52 bpm 52 bpm      Rating of Perceived Exertion (Exercise) 15 15      Symptoms no no      Duration Progress to 45 minutes of aerobic exercise without signs/symptoms of physical distress Progress to 45 minutes of aerobic exercise without signs/symptoms of physical distress      Intensity THRR unchanged THRR unchanged      Progression   Progression Continue to progress workloads to maintain intensity without signs/symptoms of physical distress. Continue to progress workloads to maintain intensity without signs/symptoms of physical distress.      Resistance Training   Training Prescription Yes Yes      Weight 4 4      Reps 10-15 10-15      Interval Training   Interval Training Yes Yes      Equipment REL-XR REL-XR      Home Exercise Plan   Plans to continue exercise at  [place] may call us to join the gym though         Exercise Comments:     Exercise Comments      01/22/16 1452 01/27/16 1431 02/26/16 1317 03/24/16 1356 03/24/16 1714   Exercise Comments Initial Target heart rate will be rest + 30, monitoring for symptoms. (Demonstrates 75% blockage of circumflex and LAD arteries) Jariana recently started Heart Track and is developing familiarity with exercise equipment and routine.  She has had no signs of difficulty or distress during sessions. Adaeze continues to tolerate exercise wel with no symptoms. Keari has progressed well adding intensity and duration to her exercise. Tanny given info about the [place] gym and the [employer] program incl exercises class of [place] said she will try to exercise at home but will contact us if she wants an orientation appt to join one of the gyms here.       Discharge Exercise Prescription (Final Exercise Prescription Changes):     Exercise Prescription Changes - 03/24/16 1700    Exercise Review   Progression Yes   Response to Exercise   Blood Pressure (Admit) 112/58 mmHg   Blood Pressure (Exercise) 118/78 mmHg   Blood Pressure (Exit) 104/62 mmHg   Heart Rate (Admit) 67 bpm   Heart Rate (Exercise) 117 bpm   Heart Rate (Exit) 52 bpm   Rating of Perceived Exertion (Exercise) 15   Symptoms no   Duration Progress to 45 minutes of aerobic exercise without signs/symptoms of physical distress   Intensity THRR unchanged   Progression   Progression Continue to progress workloads to maintain intensity without signs/symptoms of physical distress.   Resistance Training   Training Prescription Yes   Weight 4   Reps 10-15   Interval Training   Interval Training Yes   Equipment  REL-XR   Home Exercise Plan   Plans to continue exercise at [place] may call us to join the gym though      Nutrition:  Target Goals: Understanding of nutrition guidelines, daily intake of sodium 1500mg , cholesterol 200mg , calories 30% from fat and 7% or less from saturated fats, daily to have 5 or more servings of fruits and vegetables.  Biometrics:     Pre Biometrics - 01/22/16 1439    Pre Biometrics   Height 5' 1.2" (1.554 m)   Weight 146 lb 3.2 oz (66.316 kg)   Waist Circumference 33.5 inches   Hip Circumference 42.5 inches   Waist to Hip Ratio 0.79 %   BMI (Calculated) 27.5       Nutrition Therapy Plan and Nutrition Goals:     Nutrition Therapy & Goals - 02/19/16 1801    Nutrition Therapy   Diet approx. 1200kcal following DASH guidelines   Protein (specify units) 5-6oz   Fiber 20 grams   Whole Grain Foods 3 servings   Saturated Fats 10 max. grams   Fruits and Vegetables 5 servings/day   ideally 8-10 servings daily   Sodium 1500 grams   Personal Nutrition Goals   Personal Goal #1 Continue your current healthy eating pattern   Personal Goal #2 Use menus (provided) for increasing variety    Comments Lunetta reports making significant diet changes starting about 2 weeks prior to her heart attack. She has lost about 14lbs so far.    Intervention Plan   Intervention Prescribe, educate and counsel regarding individualized specific dietary modifications aiming towards targeted core components such as weight, hypertension, lipid management, diabetes, heart failure and other comorbidities.;Nutrition handout(s) given to patient.   Expected Outcomes Short Term Goal: Understand basic principles of dietary content, such as calories, fat, sodium, cholesterol and nutrients.;Short Term Goal: A plan has been developed with personal nutrition goals set during dietitian appointment.;Long Term Goal: Adherence to prescribed nutrition plan.      Nutrition Discharge: Rate Your Plate Scores:     Nutrition Assessments - 01/26/16 1201    Rate Your Plate Scores   Pre Score 63   Pre Score % 70 %      Nutrition Goals Re-Evaluation:   Psychosocial: Target Goals: Acknowledge presence or absence of depression, maximize coping skills, provide positive support system. Participant is able to verbalize types and ability to use techniques and skills needed for reducing stress and depression.  Initial Review & Psychosocial Screening:     Initial Psych Review & Screening - 01/22/16 1534    Initial Review   Comments Kella mentioned she has "irritable bowel".       Quality of Life Scores:     Quality of Life - 03/24/16 1717    Quality of Life Scores   Health/Function Post --  Tabitha to fax back post questionarres.      PHQ-9:     Recent Review Flowsheet Data    Depression screen [facility] 2/9 01/22/2016 04/08/2015 11/14/2013   Decreased Interest 0 0 0   Down, Depressed, Hopeless 0 0 0   PHQ - 2 Score  0 0 0   Altered sleeping 1 - -   Tired, decreased energy 2 - -   Change in appetite 1 - -   Feeling bad or failure about yourself  0 - -   Trouble concentrating 0 - -   Moving slowly or fidgety/restless 0 - -   Suicidal thoughts 0 - -  PHQ-9 Score 4 - -   Difficult doing work/chores Not difficult at all - -      Psychosocial Evaluation and Intervention:     Psychosocial Evaluation - 03/22/16 1707    Discharge Psychosocial Assessment & Intervention   Comments Ms. Clingan is on schedule to graduate from this program in the near future.  She continues to experience positive benefits with increased energy and stamina.  Ms. Chauncey Cruel reports her mood remains positive and she continues to sleep well.  She also continues to struggle with work stress, but is in the process of training someone to help her and hopes this will alleviate some of the stress in the future.  Ms. Chauncey Cruel plans to join a gym upon completion of this program in order to maintain her progress and positive benefits.  Counselor commended her on her commitment to exercise and improve her overall health.       Psychosocial Re-Evaluation:     Psychosocial Re-Evaluation      02/02/16 1727 03/03/16 1726         Psychosocial Re-Evaluation   Comments Counselor follow up with Ms. Lampkins reporting she is experiencing some progress in her goals with increased stamina since beginning this program several weeks ago.  She was able to go back to work full time today.  She maintains sleeping well and positive mood.  Her stress is being managed better both at work and she states she feels better about her recent concerns about her health.   Counselor follow up with Ms. Delrosario today reporting her stress at work has remained the same, but she is training someone to help her now, which makes it easier to cope with.  Ms. Chauncey Cruel continues to report this program is helpful and her mood and sleep continues to be positive.  Counselor will continue to follow  with Ms. S         Vocational Rehabilitation: Provide vocational rehab assistance to qualifying candidates.   Vocational Rehab Evaluation & Intervention:     Vocational Rehab - 01/22/16 1420    Initial Vocational Rehab Evaluation & Intervention   Assessment shows need for Vocational Rehabilitation No      Education: Education Goals: Education classes will be provided on a weekly basis, covering required topics. Participant will state understanding/return demonstration of topics presented.  Learning Barriers/Preferences:     Learning Barriers/Preferences - 01/22/16 1420    Learning Barriers/Preferences   Learning Barriers None   Learning Preferences None      Education Topics: General Nutrition Guidelines/Fats and Fiber: -Group instruction provided by verbal, written material, models and posters to present the general guidelines for heart healthy nutrition. Gives an explanation and review of dietary fats and fiber.          Cardiac Rehab from 03/22/2016 in [facility] Cardiac and Pulmonary Rehab   Date  02/16/16   Educator  PI   Instruction Review Code  2- meets goals/outcomes      Controlling Sodium/Reading Food Labels: -Group verbal and written material supporting the discussion of sodium use in heart healthy nutrition. Review and explanation with models, verbal and written materials for utilization of the food label.      Cardiac Rehab from 03/22/2016 in [facility] Cardiac and Pulmonary Rehab   Date  02/23/16   Educator  PI   Instruction Review Code  2- meets goals/outcomes      Exercise Physiology & Risk Factors: - Group verbal and written instruction with models to review the exercise  physiology of the cardiovascular system and associated critical values. Details cardiovascular disease risk factors and the goals associated with each risk factor.      Cardiac Rehab from 03/22/2016 in [facility] Cardiac and Pulmonary Rehab   Date  02/25/16   Educator  AS   Instruction Review Code   2- meets goals/outcomes      Aerobic Exercise & Resistance Training: - Gives group verbal and written discussion on the health impact of inactivity. On the components of aerobic and resistive training programs and the benefits of this training and how to safely progress through these programs.      Cardiac Rehab from 03/22/2016 in [facility] Cardiac and Pulmonary Rehab   Date  03/01/16   Educator  [facility]   Instruction Review Code  2- meets goals/outcomes      Flexibility, Balance, General Exercise Guidelines: - Provides group verbal and written instruction on the benefits of flexibility and balance training programs. Provides general exercise guidelines with specific guidelines to those with heart or lung disease. Demonstration and skill practice provided.      Cardiac Rehab from 03/22/2016 in [facility] Cardiac and Pulmonary Rehab   Date  03/03/16   Educator  AS   Instruction Review Code  2- meets goals/outcomes      Stress Management: - Provides group verbal and written instruction about the health risks of elevated stress, cause of high stress, and healthy ways to reduce stress.      Cardiac Rehab from 03/22/2016 in [facility] Cardiac and Pulmonary Rehab   Date  03/10/16   Educator  Kathreen Cornfield, [facility]   Instruction Review Code  2- meets goals/outcomes      Depression: - Provides group verbal and written instruction on the correlation between heart/lung disease and depressed mood, treatment options, and the stigmas associated with seeking treatment.      Cardiac Rehab from 03/22/2016 in [facility] Cardiac and Pulmonary Rehab   Date  02/11/16   Educator  Kathreen Cornfield, [facility]   Instruction Review Code  2- meets goals/outcomes      Anatomy & Physiology of the Heart: - Group verbal and written instruction and models provide basic cardiac anatomy and physiology, with the coronary electrical and arterial systems. Review of: AMI, Angina, Valve disease, Heart Failure, Cardiac Arrhythmia, Pacemakers, and the ICD.       Cardiac Rehab from 03/22/2016 in [facility] Cardiac and Pulmonary Rehab   Date  03/08/16   Educator  DW   Instruction Review Code  2- meets goals/outcomes      Cardiac Procedures: - Group verbal and written instruction and models to describe the testing methods done to diagnose heart disease. Reviews the outcomes of the test results. Describes the treatment choices: Medical Management, Angioplasty, or Coronary Bypass Surgery.   Cardiac Medications: - Group verbal and written instruction to review commonly prescribed medications for heart disease. Reviews the medication, class of the drug, and side effects. Includes the steps to properly store meds and maintain the prescription regimen.      Cardiac Rehab from 03/22/2016 in [facility] Cardiac and Pulmonary Rehab   Date  03/22/16   Educator  DW   Instruction Review Code  2- meets goals/outcomes      Go Sex-Intimacy & Heart Disease, Get SMART - Goal Setting: - Group verbal and written instruction through game format to discuss heart disease and the return to sexual intimacy. Provides group verbal and written material to discuss and apply goal setting through the application of the S.M.A.R.T.  Method.   Other Matters of the Heart: - Provides group verbal, written materials and models to describe Heart Failure, Angina, Valve Disease, and Diabetes in the realm of heart disease. Includes description of the disease process and treatment options available to the cardiac patient.   Exercise & Equipment Safety: - Individual verbal instruction and demonstration of equipment use and safety with use of the equipment.      Cardiac Rehab from 03/22/2016 in [facility] Cardiac and Pulmonary Rehab   Date  01/22/16   Educator  C. Sabrinia Prien RN   Instruction Review Code  1- partially meets, needs review/practice      Infection Prevention: - Provides verbal and written material to individual with discussion of infection control including proper hand washing and proper  equipment cleaning during exercise session.      Cardiac Rehab from 03/22/2016 in [facility] Cardiac and Pulmonary Rehab   Date  01/22/16   Educator  C. EnterkinRN   Instruction Review Code  2- meets goals/outcomes      Falls Prevention: - Provides verbal and written material to individual with discussion of falls prevention and safety.      Cardiac Rehab from 03/22/2016 in [facility] Cardiac and Pulmonary Rehab   Date  01/22/16   Educator  C. [place]   Instruction Review Code  2- meets goals/outcomes      Diabetes: - Individual verbal and written instruction to review signs/symptoms of diabetes, desired ranges of glucose level fasting, after meals and with exercise. Advice that pre and post exercise glucose checks will be done for 3 sessions at entry of program.    Knowledge Questionnaire Score:     Knowledge Questionnaire Score - 01/22/16 1420    Knowledge Questionnaire Score   Pre Score 25      Core Components/Risk Factors/Patient Goals at Admission:     Personal Goals and Risk Factors at Admission - 01/22/16 1421    Core Components/Risk Factors/Patient Goals on Admission   Sedentary Yes   Intervention Provide advice, education, support and counseling about physical activity/exercise needs.;Develop an individualized exercise prescription for aerobic and resistive training based on initial evaluation findings, risk stratification, comorbidities and participant's personal goals.   Expected Outcomes Achievement of increased cardiorespiratory fitness and enhanced flexibility, muscular endurance and strength shown through measurements of functional capacity and personal statement of participant.   Hypertension Yes   Intervention Provide education on lifestyle modifcations including regular physical activity/exercise, weight management, moderate sodium restriction and increased consumption of fresh fruit, vegetables, and low fat dairy, alcohol moderation, and smoking cessation.;Monitor  prescription use compliance.   Expected Outcomes Short Term: Continued assessment and intervention until BP is < 140/27mm HG in hypertensive participants. < 130/67mm HG in hypertensive participants with diabetes, heart failure or chronic kidney disease.;Long Term: Maintenance of blood pressure at goal levels.   Lipids Yes   Intervention Provide education and support for participant on nutrition & aerobic/resistive exercise along with prescribed medications to achieve LDL 70mg , HDL >40mg .   Expected Outcomes Short Term: Participant states understanding of desired cholesterol values and is compliant with medications prescribed. Participant is following exercise prescription and nutrition guidelines.;Long Term: Cholesterol controlled with medications as prescribed, with individualized exercise RX and with personalized nutrition plan. Value goals: LDL < 70mg , HDL > 40 mg.   Stress Yes   Intervention Offer individual and/or small group education and counseling on adjustment to heart disease, stress management and health-related lifestyle change. Teach and support self-help strategies.;Refer participants experiencing significant psychosocial distress to  appropriate mental health specialists for further evaluation and treatment. When possible, include family members and significant others in education/counseling sessions.   Expected Outcomes Short Term: Participant demonstrates changes in health-related behavior, relaxation and other stress management skills, ability to obtain effective social support, and compliance with psychotropic medications if prescribed.;Long Term: Emotional wellbeing is indicated by absence of clinically significant psychosocial distress or social isolation.      Core Components/Risk Factors/Patient Goals Review:      Goals and Risk Factor Review      02/19/16 1428 02/23/16 1727 03/11/16 1338       Core Components/Risk Factors/Patient Goals Review   Personal Goals Review   Hypertension;Lipids;Stress;Weight Management/Obesity      Review Fey has attended sessions regularly and has increased her treadmill walk speed.  She wants to continue to work on improving her fitness level. Moranda's personal goals were reviewed today. She stated she is losing about a pound a week. She has been very consistant with her 3 times a week of exercise in Cardiac Rehab. She has continued to increase workloads and has felt an increase in her energy and stamina. She has just gotten lab work done and she reports her cholesterol has decreased from 289-234 but her doctor is going to continue to work with her meds to keep brining it down.  Her blood pressure has been well managed with meds and life style changes. Her stress has been at good levels this week. She stated work sometimes causes stress, but has felt like she has been able to handle work stress well this week.  Atalaya has lost almost 20 lb total by cutting out sweets and changing eating habits.  She is still losing about 1 lb per week.     Expected Outcomes Dao will continue exercise prescription to acheive her goals. Amali would continue to progress with her exercise levels and healthy eating habits in combination with medication management to continue to bring down her cholesterol, maintain her blood pressure, and keep progressing with her weight loss goal. An extra day of exercise was recommended outside of class to assist with continued progression.          Core Components/Risk Factors/Patient Goals at Discharge (Final Review):      Goals and Risk Factor Review - 03/11/16 1338    Core Components/Risk Factors/Patient Goals Review   Review Shanon has lost almost 20 lb total by cutting out sweets and changing eating habits.  She is still losing about 1 lb per week.      ITP Comments:     ITP Comments      02/01/16 1134 02/18/16 1648 03/03/16 0906 03/24/16 1716 03/24/16 1717   ITP Comments 30 day review. Continue with ITP.  New to  program  3 visits since orientation on 5/11 Gen[facility] said the class time change to 4pm instead of 3:45pm helps her with her work schedule. 30 day review.  Continue with ITP Adiel given info about the [place] gym and the [employer] program incl exercises class of [place] said she will try to exercise at home but will contact us if she wants an orientation appt to join one of the gyms here.  Tomasa to fax back post questionarres. Cait left them at her work.      Comments: Helane to fax back post questionarres.

## 2016-03-24 NOTE — Patient Instructions (Signed)
Discharge Instructions  Patient Details  Name: Leah Espinoza MRN: [id number] Date of Birth: 10-30-1955 Referring Provider:  Isaias Cowman, MD   Number of Visits:  55  Reason for Discharge:  Patient reached a stable level of exercise. Patient independent in their exercise.  Smoking History:  History  Smoking status  . Former Smoker  Smokeless tobacco  . Never Used    Diagnosis:  S/P coronary artery stent placement  Initial Exercise Prescription:     Initial Exercise Prescription - 01/22/16 1400    Date of Initial Exercise RX and Referring Provider   Date 01/22/16   Treadmill   MPH 2   Grade 0   Minutes 15   Recumbant Bike   Level 2   Watts 20   Minutes 15   NuStep   Level 2   Watts 40   Minutes 15   Cybex   Level 2   RPM 50   Minutes 15   Recumbant Elliptical   Level 1   Watts 10   Minutes 15   Elliptical   Level 1   Speed 3   Minutes 5   REL-XR   Level 2   Watts 50   Minutes 15   T5 Nustep   Level 2   Watts 40   Minutes 15   Biostep-RELP   Level 2   Watts 25   Minutes 15   Prescription Details   Frequency (times per week) 3   Duration Progress to 50 minutes of aerobic without signs/symptoms of physical distress   Intensity   THRR REST +  30   Ratings of Perceived Exertion 11-13   Perceived Dyspnea 0-4   Progression   Progression Continue to progress workloads to maintain intensity without signs/symptoms of physical distress.   Resistance Training   Training Prescription Yes   Weight 2   Reps 10-15      Discharge Exercise Prescription (Final Exercise Prescription Changes):     Exercise Prescription Changes - 03/24/16 1300    Exercise Review   Progression Yes   Response to Exercise   Blood Pressure (Admit) 112/58 mmHg   Blood Pressure (Exercise) 118/78 mmHg   Blood Pressure (Exit) 104/62 mmHg   Heart Rate (Admit) 67 bpm   Heart Rate (Exercise) 117 bpm   Heart Rate (Exit) 52 bpm   Rating of Perceived Exertion  (Exercise) 15   Symptoms no   Duration Progress to 45 minutes of aerobic exercise without signs/symptoms of physical distress   Intensity THRR unchanged   Progression   Progression Continue to progress workloads to maintain intensity without signs/symptoms of physical distress.   Resistance Training   Training Prescription Yes   Weight 4   Reps 10-15   Interval Training   Interval Training Yes   Equipment REL-XR      Functional Capacity:     6 Minute Walk      01/22/16 1446 03/22/16 1717     6 Minute Walk   Phase Initial Discharge    Distance 1400 feet 1822 feet    Distance % Change  23 %    Walk Time 6 minutes 6 minutes    # of Rest Breaks 0     MPH 2.7 3.45    METS  4.2    RPE 12 15    VO2 Peak  14.7    Symptoms No No    Resting HR 59 bpm 67 bpm    Resting BP 122/62 mmHg  112/58 mmHg    Max Ex. HR 99 bpm 105 bpm    Max Ex. BP 132/70 mmHg 118/78 mmHg       Quality of Life:     Quality of Life - 01/22/16 1418    Quality of Life Scores   Health/Function Pre 17.6 %   Socioeconomic Pre 24.57 %   Psych/Spiritual Pre 18.71 %   Family Pre 20.1 %   GLOBAL Pre 19.63 %      Personal Goals: Goals established at orientation with interventions provided to work toward goal.     Personal Goals and Risk Factors at Admission - 01/22/16 1421    Core Components/Risk Factors/Patient Goals on Admission   Sedentary Yes   Intervention Provide advice, education, support and counseling about physical activity/exercise needs.;Develop an individualized exercise prescription for aerobic and resistive training based on initial evaluation findings, risk stratification, comorbidities and participant's personal goals.   Expected Outcomes Achievement of increased cardiorespiratory fitness and enhanced flexibility, muscular endurance and strength shown through measurements of functional capacity and personal statement of participant.   Hypertension Yes   Intervention Provide education on  lifestyle modifcations including regular physical activity/exercise, weight management, moderate sodium restriction and increased consumption of fresh fruit, vegetables, and low fat dairy, alcohol moderation, and smoking cessation.;Monitor prescription use compliance.   Expected Outcomes Short Term: Continued assessment and intervention until BP is < 140/60mm HG in hypertensive participants. < 130/8mm HG in hypertensive participants with diabetes, heart failure or chronic kidney disease.;Long Term: Maintenance of blood pressure at goal levels.   Lipids Yes   Intervention Provide education and support for participant on nutrition & aerobic/resistive exercise along with prescribed medications to achieve LDL 70mg , HDL >40mg .   Expected Outcomes Short Term: Participant states understanding of desired cholesterol values and is compliant with medications prescribed. Participant is following exercise prescription and nutrition guidelines.;Long Term: Cholesterol controlled with medications as prescribed, with individualized exercise RX and with personalized nutrition plan. Value goals: LDL < 70mg , HDL > 40 mg.   Stress Yes   Intervention Offer individual and/or small group education and counseling on adjustment to heart disease, stress management and health-related lifestyle change. Teach and support self-help strategies.;Refer participants experiencing significant psychosocial distress to appropriate mental health specialists for further evaluation and treatment. When possible, include family members and significant others in education/counseling sessions.   Expected Outcomes Short Term: Participant demonstrates changes in health-related behavior, relaxation and other stress management skills, ability to obtain effective social support, and compliance with psychotropic medications if prescribed.;Long Term: Emotional wellbeing is indicated by absence of clinically significant psychosocial distress or social  isolation.       Personal Goals Discharge:     Goals and Risk Factor Review - 03/11/16 1338    Core Components/Risk Factors/Patient Goals Review   Review Tiona has lost almost 20 lb total by cutting out sweets and changing eating habits.  She is still losing about 1 lb per week.      Nutrition & Weight - Outcomes:     Pre Biometrics - 01/22/16 1439    Pre Biometrics   Height 5' 1.2" (1.554 m)   Weight 146 lb 3.2 oz (66.316 kg)   Waist Circumference 33.5 inches   Hip Circumference 42.5 inches   Waist to Hip Ratio 0.79 %   BMI (Calculated) 27.5       Nutrition:     Nutrition Therapy & Goals - 02/19/16 1801    Nutrition Therapy   Diet approx. 1200kcal  following DASH guidelines   Protein (specify units) 5-6oz   Fiber 20 grams   Whole Grain Foods 3 servings   Saturated Fats 10 max. grams   Fruits and Vegetables 5 servings/day  ideally 8-10 servings daily   Sodium 1500 grams   Personal Nutrition Goals   Personal Goal #1 Continue your current healthy eating pattern   Personal Goal #2 Use menus (provided) for increasing variety    Comments Nelsy reports making significant diet changes starting about 2 weeks prior to her heart attack. She has lost about 14lbs so far.    Intervention Plan   Intervention Prescribe, educate and counsel regarding individualized specific dietary modifications aiming towards targeted core components such as weight, hypertension, lipid management, diabetes, heart failure and other comorbidities.;Nutrition handout(s) given to patient.   Expected Outcomes Short Term Goal: Understand basic principles of dietary content, such as calories, fat, sodium, cholesterol and nutrients.;Short Term Goal: A plan has been developed with personal nutrition goals set during dietitian appointment.;Long Term Goal: Adherence to prescribed nutrition plan.      Nutrition Discharge:     Nutrition Assessments - 01/26/16 1201    Rate Your Plate Scores   Pre Score 63    Pre Score % 70 %      Education Questionnaire Score:     Knowledge Questionnaire Score - 01/22/16 1420    Knowledge Questionnaire Score   Pre Score 25      Goals reviewed with patient; copy given to patient.

## 2016-03-24 NOTE — Progress Notes (Signed)
Discharge Summary  Patient Details  Name: Leah Espinoza MRN: [id number] Date of Birth: 29-Mar-1956 Referring Provider:     Number of Visits: 36/36  Reason for Discharge:  Patient reached a stable level of exercise. Patient independent in their exercise.  Smoking History:  History  Smoking status  . Former Smoker  Smokeless tobacco  . Never Used    Diagnosis:  S/P coronary artery stent placement  ADL [facility]:   Initial Exercise Prescription:     Initial Exercise Prescription - 01/22/16 1400    Date of Initial Exercise RX and Referring Provider   Date 01/22/16   Treadmill   MPH 2   Grade 0   Minutes 15   Recumbant Bike   Level 2   Watts 20   Minutes 15   NuStep   Level 2   Watts 40   Minutes 15   Cybex   Level 2   RPM 50   Minutes 15   Recumbant Elliptical   Level 1   Watts 10   Minutes 15   Elliptical   Level 1   Speed 3   Minutes 5   REL-XR   Level 2   Watts 50   Minutes 15   T5 Nustep   Level 2   Watts 40   Minutes 15   Biostep-RELP   Level 2   Watts 25   Minutes 15   Prescription Details   Frequency (times per week) 3   Duration Progress to 50 minutes of aerobic without signs/symptoms of physical distress   Intensity   THRR REST +  30   Ratings of Perceived Exertion 11-13   Perceived Dyspnea 0-4   Progression   Progression Continue to progress workloads to maintain intensity without signs/symptoms of physical distress.   Resistance Training   Training Prescription Yes   Weight 2   Reps 10-15      Discharge Exercise Prescription (Final Exercise Prescription Changes):     Exercise Prescription Changes - 03/24/16 1700    Exercise Review   Progression Yes   Response to Exercise   Blood Pressure (Admit) 112/58 mmHg   Blood Pressure (Exercise) 118/78 mmHg   Blood Pressure (Exit) 104/62 mmHg   Heart Rate (Admit) 67 bpm   Heart Rate (Exercise) 117 bpm   Heart Rate (Exit) 52 bpm   Rating of Perceived Exertion (Exercise) 15   Symptoms no   Duration Progress to 45 minutes of aerobic exercise without signs/symptoms of physical distress   Intensity THRR unchanged   Progression   Progression Continue to progress workloads to maintain intensity without signs/symptoms of physical distress.   Resistance Training   Training Prescription Yes   Weight 4   Reps 10-15   Interval Training   Interval Training Yes   Equipment REL-XR   Home Exercise Plan   Plans to continue exercise at [place] may call us to join the gym though      Functional Capacity:     6 Minute Walk      01/22/16 1446 03/22/16 1717     6 Minute Walk   Phase Initial Discharge    Distance 1400 feet 1822 feet    Distance % Change  23 %    Walk Time 6 minutes 6 minutes    # of Rest Breaks 0     MPH 2.7 3.45    METS  4.2    RPE 12 15  VO2 Peak  14.7    Symptoms No No    Resting HR 59 bpm 67 bpm    Resting BP 122/62 mmHg 112/58 mmHg    Max Ex. HR 99 bpm 105 bpm    Max Ex. BP 132/70 mmHg 118/78 mmHg       Psychological, QOL, Others - Outcomes: PHQ 2/9: Depression screen [facility] 2/9 01/22/2016 04/08/2015 11/14/2013  Decreased Interest 0 0 0  Down, Depressed, Hopeless 0 0 0  PHQ - 2 Score 0 0 0  Altered sleeping 1 - -  Tired, decreased energy 2 - -  Change in appetite 1 - -  Feeling bad or failure about yourself  0 - -  Trouble concentrating 0 - -  Moving slowly or fidgety/restless 0 - -  Suicidal thoughts 0 - -  PHQ-9 Score 4 - -  Difficult doing work/chores Not difficult at all - -    Quality of Life:     Quality of Life - 03/24/16 1717    Quality of Life Scores   Health/Function Post --  Leah Espinoza to fax back post questionarres.      Personal Goals: Goals established at orientation with interventions provided to work toward goal.     Personal Goals and Risk Factors at Admission - 01/22/16 1421    Core Components/Risk Factors/Patient Goals on Admission   Sedentary Yes   Intervention Provide advice,  education, support and counseling about physical activity/exercise needs.;Develop an individualized exercise prescription for aerobic and resistive training based on initial evaluation findings, risk stratification, comorbidities and participant's personal goals.   Expected Outcomes Achievement of increased cardiorespiratory fitness and enhanced flexibility, muscular endurance and strength shown through measurements of functional capacity and personal statement of participant.   Hypertension Yes   Intervention Provide education on lifestyle modifcations including regular physical activity/exercise, weight management, moderate sodium restriction and increased consumption of fresh fruit, vegetables, and low fat dairy, alcohol moderation, and smoking cessation.;Monitor prescription use compliance.   Expected Outcomes Short Term: Continued assessment and intervention until BP is < 140/42mm HG in hypertensive participants. < 130/22mm HG in hypertensive participants with diabetes, heart failure or chronic kidney disease.;Long Term: Maintenance of blood pressure at goal levels.   Lipids Yes   Intervention Provide education and support for participant on nutrition & aerobic/resistive exercise along with prescribed medications to achieve LDL 70mg , HDL >40mg .   Expected Outcomes Short Term: Participant states understanding of desired cholesterol values and is compliant with medications prescribed. Participant is following exercise prescription and nutrition guidelines.;Long Term: Cholesterol controlled with medications as prescribed, with individualized exercise RX and with personalized nutrition plan. Value goals: LDL < 70mg , HDL > 40 mg.   Stress Yes   Intervention Offer individual and/or small group education and counseling on adjustment to heart disease, stress management and health-related lifestyle change. Teach and support self-help strategies.;Refer participants experiencing significant psychosocial distress  to appropriate mental health specialists for further evaluation and treatment. When possible, include family members and significant others in education/counseling sessions.   Expected Outcomes Short Term: Participant demonstrates changes in health-related behavior, relaxation and other stress management skills, ability to obtain effective social support, and compliance with psychotropic medications if prescribed.;Long Term: Emotional wellbeing is indicated by absence of clinically significant psychosocial distress or social isolation.       Personal Goals Discharge:     Goals and Risk Factor Review      02/19/16 1428 02/23/16 1727 03/11/16 1338       Core Components/Risk  Factors/Patient Goals Review   Personal Goals Review  Hypertension;Lipids;Stress;Weight Management/Obesity      Review Leah Espinoza has attended sessions regularly and has increased her treadmill walk speed.  She wants to continue to work on improving her fitness level. Leah Espinoza's personal goals were reviewed today. She stated she is losing about a pound a week. She has been very consistant with her 3 times a week of exercise in [facility]. She has continued to increase workloads and has felt an increase in her energy and stamina. She has just gotten lab work done and she reports her cholesterol has decreased from 289-234 but her doctor is going to continue to work with her meds to keep brining it down.  Her blood pressure has been well managed with meds and life style changes. Her stress has been at good levels this week. She stated work sometimes causes stress, but has felt like she has been able to handle work stress well this week.  Leah Espinoza has lost almost 20 lb total by cutting out sweets and changing eating habits.  She is still losing about 1 lb per week.     Expected Outcomes Leah Espinoza will continue exercise prescription to acheive her goals. Leah Espinoza would continue to progress with her exercise levels and healthy eating habits in combination  with medication management to continue to bring down her cholesterol, maintain her blood pressure, and keep progressing with her weight loss goal. An extra day of exercise was recommended outside of class to assist with continued progression.          Nutrition & Weight - Outcomes:     Pre Biometrics - 01/22/16 1439    Pre Biometrics   Height 5' 1.2" (1.554 m)   Weight 146 lb 3.2 oz (66.316 kg)   Waist Circumference 33.5 inches   Hip Circumference 42.5 inches   Waist to Hip Ratio 0.79 %   BMI (Calculated) 27.5       Nutrition:     Nutrition Therapy & Goals - 02/19/16 1801    Nutrition Therapy   Diet approx. 1200kcal following DASH guidelines   Protein (specify units) 5-6oz   Fiber 20 grams   Whole Grain Foods 3 servings   Saturated Fats 10 max. grams   Fruits and Vegetables 5 servings/day  ideally 8-10 servings daily   Sodium 1500 grams   Personal Nutrition Goals   Personal Goal #1 Continue your current healthy eating pattern   Personal Goal #2 Use menus (provided) for increasing variety    Comments Leah Espinoza reports making significant diet changes starting about 2 weeks prior to her heart attack. She has lost about 14lbs so far.    Intervention Plan   Intervention Prescribe, educate and counsel regarding individualized specific dietary modifications aiming towards targeted core components such as weight, hypertension, lipid management, diabetes, heart failure and other comorbidities.;Nutrition handout(s) given to patient.   Expected Outcomes Short Term Goal: Understand basic principles of dietary content, such as calories, fat, sodium, cholesterol and nutrients.;Short Term Goal: A plan has been developed with personal nutrition goals set during dietitian appointment.;Long Term Goal: Adherence to prescribed nutrition plan.      Nutrition Discharge:     Nutrition Assessments - 01/26/16 1201    Rate Your Plate Scores   Pre Score 63   Pre Score % 70 %      Education  Questionnaire Score:     Knowledge Questionnaire Score - 01/22/16 1420    Knowledge Questionnaire Score   Pre Score  25      Goals reviewed with patient; copy given to patient.

## 2016-03-24 NOTE — Progress Notes (Signed)
Daily Session Note  Patient Details  Name: TATIYANA FOUCHER MRN: [id number] Date of Birth: 1956/03/19 Referring Provider:    Encounter Date: 03/24/2016  Check In:     Session Check In - 03/24/16 1714    Check-In   Location [facility]   Staff Present Gerlene Burdock, RN, Vickki Hearing, BA, ACSM CEP, Exercise Physiologist;Diane Joya Gaskins, RN, BSN   Supervising physician immediately available to respond to emergencies See telemetry face sheet for immediately available ER MD   Medication changes reported     No   Fall or balance concerns reported    No   Warm-up and Cool-down Performed on first and last piece of equipment   Resistance Training Performed No   VAD Patient? No   Pain Assessment   Currently in Pain? No/denies           Exercise Prescription Changes - 03/24/16 1300    Exercise Review   Progression Yes   Response to Exercise   Blood Pressure (Admit) 112/58 mmHg   Blood Pressure (Exercise) 118/78 mmHg   Blood Pressure (Exit) 104/62 mmHg   Heart Rate (Admit) 67 bpm   Heart Rate (Exercise) 117 bpm   Heart Rate (Exit) 52 bpm   Rating of Perceived Exertion (Exercise) 15   Symptoms no   Duration Progress to 45 minutes of aerobic exercise without signs/symptoms of physical distress   Intensity THRR unchanged   Progression   Progression Continue to progress workloads to maintain intensity without signs/symptoms of physical distress.   Resistance Training   Training Prescription Yes   Weight 4   Reps 10-15   Interval Training   Interval Training Yes   Equipment REL-XR      Goals Met:  Proper associated with RPD/PD & O2 Sat Exercise tolerated well  Goals Unmet:  Not Applicable  Comments:     Dr. Emily Filbert is Medical Director for [facility] and [facility].

## 2016-04-28 ENCOUNTER — Encounter: Payer: Self-pay | Admitting: Internal Medicine

## 2016-04-28 ENCOUNTER — Ambulatory Visit (INDEPENDENT_AMBULATORY_CARE_PROVIDER_SITE_OTHER): Payer: Managed Care, Other (non HMO) | Admitting: Internal Medicine

## 2016-04-28 VITALS — BP 110/80 | HR 49 | Temp 97.9°F | Resp 18 | Ht 60.5 in | Wt 133.2 lb

## 2016-04-28 DIAGNOSIS — I2119 ST elevation (STEMI) myocardial infarction involving other coronary artery of inferior wall: Secondary | ICD-10-CM

## 2016-04-28 DIAGNOSIS — E7800 Pure hypercholesterolemia, unspecified: Secondary | ICD-10-CM

## 2016-04-28 DIAGNOSIS — Z1239 Encounter for other screening for malignant neoplasm of breast: Secondary | ICD-10-CM | POA: Diagnosis not present

## 2016-04-28 DIAGNOSIS — I700 Atherosclerosis of aorta: Secondary | ICD-10-CM

## 2016-04-28 DIAGNOSIS — F439 Reaction to severe stress, unspecified: Secondary | ICD-10-CM

## 2016-04-28 DIAGNOSIS — Z658 Other specified problems related to psychosocial circumstances: Secondary | ICD-10-CM

## 2016-04-28 MED ORDER — ROSUVASTATIN CALCIUM 20 MG PO TABS: 20.0000 mg | ORAL_TABLET | Freq: Every day | ORAL | 3 refills | Status: DC

## 2016-04-28 NOTE — Progress Notes (Signed)
Pre-visit discussion using our clinic review tool. No additional management support is needed unless otherwise documented below in the visit note.  

## 2016-04-28 NOTE — Progress Notes (Signed)
Patient ID: Leah Espinoza, female   DOB: [birth year], 60 y.o.   MRN: [id number]   Subjective:    Patient ID: Leah Espinoza, female    DOB: 13-Oct-1955, 60 y.o.   MRN: [id number]  HPI  Patient here for a scheduled follow up.  She is s/p anterior STEMI, DES proximal LAD 12/23/15.  Being followed by cardiology.   Completed cardiac rehab.  Still exercising.  Watching her diet.  Lost weight.  Discussed cholesterol.  She is taking her medication on a regular basis now.  Discussed goal LDL 70.  Discussed changing to crestor.  No chest pain.  No sob.  Overall she feels she is doing relatively well.  No abdominal pain.  Bowels stable.    Past Medical History:  Diagnosis Date  . History of chicken pox   . History of fainting   . Hyperlipidemia   . Hypertension   . MI (myocardial infarction) [facility])    Past Surgical History:  Procedure Laterality Date  . CARDIAC CATHETERIZATION N/A 12/23/2015   Procedure: Left Heart Cath and Coronary Angiography;  Surgeon: Isaias Cowman, MD;  Location: [place] [facility];  Service: Cardiovascular;  Laterality: N/A;  . CARDIAC CATHETERIZATION N/A 12/23/2015   Procedure: Coronary Stent Intervention;  Surgeon: Isaias Cowman, MD;  Location: [place] [facility];  Service: Cardiovascular;  Laterality: N/A;   Family History  Problem Relation Age of Onset  . Hyperlipidemia Mother   . Heart disease Mother   . Cancer Father     lung  . Sudden death Brother   . Arthritis Maternal Grandmother    Social History   Social History  . Marital status: Married    Spouse name: N/A  . Number of children: N/A  . Years of education: N/A   Social History Main Topics  . Smoking status: Former Research scientist (life sciences)  . Smokeless tobacco: Never Used  . Alcohol use No  . Drug use: No  . Sexual activity: Not Asked   Other Topics Concern  . None   Social History Narrative  . None    Outpatient Encounter Prescriptions as of 04/28/2016  Medication Sig  . aspirin EC 81 MG EC  tablet Take 1 tablet (81 mg total) by mouth daily.  . metoprolol tartrate (LOPRESSOR) 25 MG tablet Take 0.5 tablets (12.5 mg total) by mouth 2 (two) times daily.  . ticagrelor (BRILINTA) 90 MG TABS tablet Take 1 tablet (90 mg total) by mouth 2 (two) times daily.  . [DISCONTINUED] pravastatin (PRAVACHOL) 40 MG tablet Take 1 tablet (40 mg total) by mouth daily.  . rosuvastatin (CRESTOR) 20 MG tablet Take 1 tablet (20 mg total) by mouth daily.   No facility-administered encounter medications on file as of 04/28/2016.     Review of Systems  Constitutional:       Has adjusted her diet.  Lost weight.   HENT: Negative for congestion and sinus pressure.   Respiratory: Negative for cough, chest tightness and shortness of breath.   Cardiovascular: Negative for chest pain, palpitations and leg swelling.  Gastrointestinal: Negative for abdominal pain, diarrhea, nausea and vomiting.  Genitourinary: Negative for difficulty urinating and dysuria.  Musculoskeletal: Negative for back pain and joint swelling.  Skin: Negative for color change and rash.  Neurological: Negative for dizziness, light-headedness and headaches.  Psychiatric/Behavioral: Negative for agitation and dysphoric mood.       Objective:     Blood pressure rechecked by me:  138/86  Physical Exam  Constitutional: She appears  well-developed and well-nourished. No distress.  HENT:  Nose: Nose normal.  Mouth/Throat: Oropharynx is clear and moist.  Neck: Neck supple. No thyromegaly present.  Cardiovascular: Normal rate and regular rhythm.   Pulmonary/Chest: Breath sounds normal. No respiratory distress. She has no wheezes.  Abdominal: Soft. Bowel sounds are normal. There is no tenderness.  Musculoskeletal: She exhibits no edema or tenderness.  Lymphadenopathy:    She has no cervical adenopathy.  Skin: No rash noted. No erythema.  Psychiatric: She has a normal mood and affect. Her behavior is normal.    BP 110/80   Pulse (!) 49    Temp 97.9 F (36.6 C) (Oral)   Resp 18   Ht 5' 0.5" (1.537 m)   Wt 133 lb 4 oz (60.4 kg)   SpO2 98%   BMI 25.60 kg/m  Wt Readings from Last 3 Encounters:  04/28/16 133 lb 4 oz (60.4 kg)  02/27/16 142 lb 14.4 oz (64.8 kg)  02/16/16 143 lb (64.9 kg)     Lab Results  Component Value Date   WBC 6.0 02/27/2016   HGB 12.7 02/27/2016   HCT 37.5 02/27/2016   PLT 240 02/27/2016   GLUCOSE 116 (H) 02/27/2016   CHOL 234 (H) 02/17/2016   TRIG 102 02/17/2016   HDL 52 02/17/2016   LDLCALC 162 (H) 02/17/2016   ALT 10 02/17/2016   AST 17 02/17/2016   NA 139 02/27/2016   K 3.4 (L) 02/27/2016   CL 107 02/27/2016   CREATININE 1.03 (H) 02/27/2016   BUN 15 02/27/2016   CO2 20 (L) 02/27/2016   TSH 2.020 12/09/2015   INR 1.06 12/24/2015    Dg Chest 2 View  Result Date: 02/27/2016 CLINICAL DATA:  PVCs.  Heart racing. EXAM: CHEST  2 VIEW COMPARISON:  None. FINDINGS: Normal heart size and mediastinal contours. Left coronary stent noted No acute infiltrate or edema. No effusion or pneumothorax. No osseous findings. IMPRESSION: No evidence of active disease. Electronically Signed   By: Monte Fantasia M.D.   On: 02/27/2016 09:14   Ct Angio Chest Pe W Or Wo Contrast  Result Date: 02/27/2016 CLINICAL DATA:  Onset of weakness, labored breathing and heart racing this morning. EXAM: CT ANGIOGRAPHY CHEST WITH CONTRAST TECHNIQUE: Multidetector CT imaging of the chest was performed using the standard protocol during bolus administration of intravenous contrast. Multiplanar CT image reconstructions and MIPs were obtained to evaluate the vascular anatomy. CONTRAST:  75 cc Isovue 370 COMPARISON:  PA and lateral chest earlier today. FINDINGS: No pulmonary embolus is identified. There is no axillary, hilar or mediastinal lymphadenopathy. Heart size is upper normal. No pleural or pericardial effusion. A few scattered aortic atherosclerotic calcifications are identified. The lungs are clear. Imaged upper abdomen  is unremarkable. Imaged bones are also unremarkable. Review of the MIP images confirms the above findings. IMPRESSION: Negative for pulmonary embolus or acute disease. Scattered aortic atherosclerosis. Electronically Signed   By: Inge Rise M.D.   On: 02/27/2016 11:16       Assessment & Plan:   Problem List Items Addressed This Visit    Aortic atherosclerosis ([place])    Noted on recent CT.  Change to crestor.  Continue aggressive risk factor modification.       Relevant Medications   rosuvastatin (CRESTOR) 20 MG tablet   Hypercholesterolemia    Discussed cholesterol levels with her today.  On pravastatin.  Will change to crestor.  Low cholesterol diet and exercise.  Follow lipid panel and liver function tests.  Relevant Medications   rosuvastatin (CRESTOR) 20 MG tablet   Other Relevant Orders   Lipid panel   Hepatic function panel   ST elevation myocardial infarction (STEMI) of inferior wall, initial episode of care [facility])    Recently admitted.  S/p DES 12/2015.  Followed by cardiology.  Doing well.  Continue aggressive risk factor modification.       Relevant Medications   rosuvastatin (CRESTOR) 20 MG tablet   Other Relevant Orders   Basic metabolic panel   Stress    Overall doing relatively well.  Follow.        Other Visit Diagnoses    Screening breast examination    -  Primary   Relevant Orders   MM Digital Screening       Einar Pheasant, MD

## 2016-05-05 ENCOUNTER — Encounter: Payer: Self-pay | Admitting: Internal Medicine

## 2016-05-05 DIAGNOSIS — I700 Atherosclerosis of aorta: Secondary | ICD-10-CM | POA: Insufficient documentation

## 2016-05-05 NOTE — Assessment & Plan Note (Signed)
Overall doing relatively well.  Follow.   

## 2016-05-05 NOTE — Assessment & Plan Note (Signed)
Recently admitted.  S/p DES 12/2015.  Followed by cardiology.  Doing well.  Continue aggressive risk factor modification.

## 2016-05-05 NOTE — Assessment & Plan Note (Signed)
Discussed cholesterol levels with her today.  On pravastatin.  Will change to crestor.  Low cholesterol diet and exercise.  Follow lipid panel and liver function tests.

## 2016-05-05 NOTE — Assessment & Plan Note (Signed)
Noted on recent CT.  Change to crestor.  Continue aggressive risk factor modification.

## 2016-06-17 ENCOUNTER — Encounter: Payer: Self-pay | Admitting: Internal Medicine

## 2016-07-14 ENCOUNTER — Ambulatory Visit: Payer: Managed Care, Other (non HMO)

## 2016-07-24 LAB — BASIC METABOLIC PANEL
BUN/Creatinine Ratio: 14 (ref 12–28)
BUN: 18 mg/dL (ref 8–27)
CALCIUM: 9.2 mg/dL (ref 8.7–10.3)
CO2: 25 mmol/L (ref 18–29)
CREATININE: 1.25 mg/dL — AB (ref 0.57–1.00)
Chloride: 103 mmol/L (ref 96–106)
GFR, EST AFRICAN AMERICAN: 54 mL/min/{1.73_m2} — AB (ref >59)
GFR, EST NON AFRICAN AMERICAN: 47 mL/min/{1.73_m2} — AB (ref >59)
Glucose: 98 mg/dL (ref 65–99)
POTASSIUM: 4.7 mmol/L (ref 3.5–5.2)
Sodium: 142 mmol/L (ref 134–144)

## 2016-07-24 LAB — HEPATIC FUNCTION PANEL
ALBUMIN: 3.9 g/dL (ref 3.6–4.8)
ALT: 24 IU/L (ref 0–32)
AST: 26 IU/L (ref 0–40)
Alkaline Phosphatase: 87 IU/L (ref 39–117)
BILIRUBIN TOTAL: 0.3 mg/dL (ref 0.0–1.2)
Bilirubin, Direct: 0.1 mg/dL (ref 0.00–0.40)
TOTAL PROTEIN: 7.3 g/dL (ref 6.0–8.5)

## 2016-07-24 LAB — LIPID PANEL
CHOLESTEROL TOTAL: 185 mg/dL (ref 100–199)
Chol/HDL Ratio: 2.8 ratio units (ref 0.0–4.4)
HDL: 67 mg/dL (ref >39)
LDL CALC: 105 mg/dL — AB (ref 0–99)
Triglycerides: 66 mg/dL (ref 0–149)
VLDL Cholesterol Cal: 13 mg/dL (ref 5–40)

## 2016-07-28 ENCOUNTER — Ambulatory Visit (INDEPENDENT_AMBULATORY_CARE_PROVIDER_SITE_OTHER): Payer: Managed Care, Other (non HMO) | Admitting: Internal Medicine

## 2016-07-28 ENCOUNTER — Encounter: Payer: Self-pay | Admitting: Internal Medicine

## 2016-07-28 VITALS — BP 140/90 | HR 53 | Temp 97.9°F | Ht 61.0 in | Wt 138.6 lb

## 2016-07-28 DIAGNOSIS — I252 Old myocardial infarction: Secondary | ICD-10-CM

## 2016-07-28 DIAGNOSIS — F439 Reaction to severe stress, unspecified: Secondary | ICD-10-CM | POA: Diagnosis not present

## 2016-07-28 DIAGNOSIS — I700 Atherosclerosis of aorta: Secondary | ICD-10-CM

## 2016-07-28 DIAGNOSIS — R7989 Other specified abnormal findings of blood chemistry: Secondary | ICD-10-CM | POA: Diagnosis not present

## 2016-07-28 DIAGNOSIS — R030 Elevated blood-pressure reading, without diagnosis of hypertension: Secondary | ICD-10-CM | POA: Diagnosis not present

## 2016-07-28 DIAGNOSIS — E7800 Pure hypercholesterolemia, unspecified: Secondary | ICD-10-CM | POA: Diagnosis not present

## 2016-07-28 LAB — BASIC METABOLIC PANEL
BUN: 14 mg/dL (ref 6–23)
CALCIUM: 9.6 mg/dL (ref 8.4–10.5)
CO2: 28 mEq/L (ref 19–32)
CREATININE: 0.96 mg/dL (ref 0.40–1.20)
Chloride: 104 mEq/L (ref 96–112)
GFR: 62.7 mL/min (ref >60.00)
GLUCOSE: 93 mg/dL (ref 70–99)
Potassium: 4.4 mEq/L (ref 3.5–5.1)
Sodium: 139 mEq/L (ref 135–145)

## 2016-07-28 MED ORDER — METOPROLOL TARTRATE 25 MG PO TABS: 12.5000 mg | ORAL_TABLET | Freq: Two times a day (BID) | ORAL | 5 refills | Status: DC

## 2016-07-28 NOTE — Progress Notes (Signed)
Pre visit review using our clinic review tool, if applicable. No additional management support is needed unless otherwise documented below in the visit note. 

## 2016-07-28 NOTE — Progress Notes (Signed)
Patient ID: Leah Espinoza, female   DOB: 09-Dec-1955, 60 y.o.   MRN: [id number]   Subjective:    Patient ID: Leah Espinoza, female    DOB: 12-19-1955, 60 y.o.   MRN: [id number]  HPI  Patient here for a scheduled follow up.  Is s/p anterior STEMI 12/23/15.  Is s/p PCI - stent LAD.  EF 45-50%.  Had  F/u with Dr Saralyn Pilar 05/06/16.  Felt stable.  Recommended 4 month f/u.  She has noticed an occasional twinge of pain in her chest.  Does not last and does not feel anything like what she experienced previously.  She feels overall things are stable from a cardiac standpoint.  Breathing stable.  No nausea or vomiting.  No abdominal pain.  Bowels stable.  Discussed her labs.  Discussed improved cholesterol.     Past Medical History:  Diagnosis Date  . History of chicken pox   . History of fainting   . Hyperlipidemia   . Hypertension   . MI (myocardial infarction)    Past Surgical History:  Procedure Laterality Date  . CARDIAC CATHETERIZATION N/A 12/23/2015   Procedure: Left Heart Cath and Coronary Angiography;  Surgeon: Isaias Cowman, MD;  Location: [place] [facility];  Service: Cardiovascular;  Laterality: N/A;  . CARDIAC CATHETERIZATION N/A 12/23/2015   Procedure: Coronary Stent Intervention;  Surgeon: Isaias Cowman, MD;  Location: [place] [facility];  Service: Cardiovascular;  Laterality: N/A;   Family History  Problem Relation Age of Onset  . Hyperlipidemia Mother   . Heart disease Mother   . Cancer Father     lung  . Sudden death Brother   . Arthritis Maternal Grandmother    Social History   Social History  . Marital status: Married    Spouse name: N/A  . Number of children: N/A  . Years of education: N/A   Social History Main Topics  . Smoking status: Former Research scientist (life sciences)  . Smokeless tobacco: Never Used  . Alcohol use No  . Drug use: No  . Sexual activity: Not Asked   Other Topics Concern  . None   Social History Narrative  . None    Outpatient Encounter  Prescriptions as of 07/28/2016  Medication Sig  . aspirin EC 81 MG EC tablet Take 1 tablet (81 mg total) by mouth daily.  . metoprolol tartrate (LOPRESSOR) 25 MG tablet Take 0.5 tablets (12.5 mg total) by mouth 2 (two) times daily.  . rosuvastatin (CRESTOR) 20 MG tablet Take 1 tablet (20 mg total) by mouth daily.  . ticagrelor (BRILINTA) 90 MG TABS tablet Take 1 tablet (90 mg total) by mouth 2 (two) times daily.  . [DISCONTINUED] metoprolol tartrate (LOPRESSOR) 25 MG tablet Take 0.5 tablets (12.5 mg total) by mouth 2 (two) times daily.   No facility-administered encounter medications on file as of 07/28/2016.     Review of Systems  Constitutional: Negative for appetite change and unexpected weight change.  HENT: Negative for congestion and sinus pressure.   Respiratory: Negative for cough, chest tightness and shortness of breath.   Cardiovascular: Negative for palpitations and leg swelling.       No significant chest pain.    Gastrointestinal: Negative for abdominal pain, diarrhea, nausea and vomiting.  Genitourinary: Negative for difficulty urinating and dysuria.  Musculoskeletal: Negative for back pain and joint swelling.  Skin: Negative for color change and rash.  Neurological: Negative for dizziness, light-headedness and headaches.  Psychiatric/Behavioral: Negative for agitation and dysphoric mood.  Objective:     Blood pressure rechecked by me:  132/82  Physical Exam  Constitutional: She appears well-developed and well-nourished. No distress.  HENT:  Nose: Nose normal.  Mouth/Throat: Oropharynx is clear and moist.  Neck: Neck supple. No thyromegaly present.  Cardiovascular: Normal rate and regular rhythm.   Pulmonary/Chest: Breath sounds normal. No respiratory distress. She has no wheezes.  Abdominal: Soft. Bowel sounds are normal. There is no tenderness.  Musculoskeletal: She exhibits no edema or tenderness.  Lymphadenopathy:    She has no cervical adenopathy.    Skin: No rash noted. No erythema.  Psychiatric: She has a normal mood and affect. Her behavior is normal.    BP 140/90   Pulse (!) 53   Temp 97.9 F (36.6 C) (Oral)   Ht 5\' 1"  (1.549 m)   Wt 138 lb 9.6 oz (62.9 kg)   SpO2 99%   BMI 26.19 kg/m  Wt Readings from Last 3 Encounters:  07/28/16 138 lb 9.6 oz (62.9 kg)  04/28/16 133 lb 4 oz (60.4 kg)  02/27/16 142 lb 14.4 oz (64.8 kg)     Lab Results  Component Value Date   WBC 6.0 02/27/2016   HGB 12.7 02/27/2016   HCT 37.5 02/27/2016   PLT 240 02/27/2016   GLUCOSE 93 07/28/2016   CHOL 185 07/23/2016   TRIG 66 07/23/2016   HDL 67 07/23/2016   LDLCALC 105 (H) 07/23/2016   ALT 24 07/23/2016   AST 26 07/23/2016   NA 139 07/28/2016   K 4.4 07/28/2016   CL 104 07/28/2016   CREATININE 0.96 07/28/2016   BUN 14 07/28/2016   CO2 28 07/28/2016   TSH 2.020 12/09/2015   INR 1.06 12/24/2015    Dg Chest 2 View  Result Date: 02/27/2016 CLINICAL DATA:  PVCs.  Heart racing. EXAM: CHEST  2 VIEW COMPARISON:  None. FINDINGS: Normal heart size and mediastinal contours. Left coronary stent noted No acute infiltrate or edema. No effusion or pneumothorax. No osseous findings. IMPRESSION: No evidence of active disease. Electronically Signed   By: Monte Fantasia M.D.   On: 02/27/2016 09:14   Ct Angio Chest Pe W Or Wo Contrast  Result Date: 02/27/2016 CLINICAL DATA:  Onset of weakness, labored breathing and heart racing this morning. EXAM: CT ANGIOGRAPHY CHEST WITH CONTRAST TECHNIQUE: Multidetector CT imaging of the chest was performed using the standard protocol during bolus administration of intravenous contrast. Multiplanar CT image reconstructions and MIPs were obtained to evaluate the vascular anatomy. CONTRAST:  75 cc Isovue 370 COMPARISON:  PA and lateral chest earlier today. FINDINGS: No pulmonary embolus is identified. There is no axillary, hilar or mediastinal lymphadenopathy. Heart size is upper normal. No pleural or pericardial  effusion. A few scattered aortic atherosclerotic calcifications are identified. The lungs are clear. Imaged upper abdomen is unremarkable. Imaged bones are also unremarkable. Review of the MIP images confirms the above findings. IMPRESSION: Negative for pulmonary embolus or acute disease. Scattered aortic atherosclerosis. Electronically Signed   By: Inge Rise M.D.   On: 02/27/2016 11:16       Assessment & Plan:   Problem List Items Addressed This Visit    Aortic atherosclerosis ([place])    On crestor.  Follow.        Relevant Medications   metoprolol tartrate (LOPRESSOR) 25 MG tablet   Elevated blood pressure reading    On lopressor.  Follow pressures.  On recheck improved.        Elevated serum creatinine -  Primary    Stay hydrated.  Recheck today.  Follow.       Relevant Orders   Basic metabolic panel (Completed)   History of ST elevation myocardial infarction (STEMI)    See previous note.  Seeing Dr Saralyn Pilar.  Continue risk factor modification.  Currently feels stable.  Follow.        Hypercholesterolemia    On crestor.  Low cholesterol diet and exercise.  Cholesterol has improved.  Follow.  Discussed increasing the dose of crestor.  She wants to follow.  Follow lipid panel and liver function tests.        Relevant Medications   metoprolol tartrate (LOPRESSOR) 25 MG tablet   Stress    Overall stable.  Follow.            Einar Pheasant, MD

## 2016-08-08 ENCOUNTER — Encounter: Payer: Self-pay | Admitting: Internal Medicine

## 2016-08-08 DIAGNOSIS — I252 Old myocardial infarction: Secondary | ICD-10-CM | POA: Insufficient documentation

## 2016-08-08 DIAGNOSIS — R7989 Other specified abnormal findings of blood chemistry: Secondary | ICD-10-CM | POA: Insufficient documentation

## 2016-08-08 NOTE — Assessment & Plan Note (Signed)
Overall stable.  Follow.  

## 2016-08-08 NOTE — Assessment & Plan Note (Signed)
On crestor.  Low cholesterol diet and exercise.  Cholesterol has improved.  Follow.  Discussed increasing the dose of crestor.  She wants to follow.  Follow lipid panel and liver function tests.

## 2016-08-08 NOTE — Assessment & Plan Note (Signed)
Stay hydrated.  Recheck today.  Follow.

## 2016-08-08 NOTE — Assessment & Plan Note (Signed)
See previous note.  Seeing Dr Saralyn Pilar.  Continue risk factor modification.  Currently feels stable.  Follow.

## 2016-08-08 NOTE — Assessment & Plan Note (Signed)
On crestor.  Follow.

## 2016-08-08 NOTE — Assessment & Plan Note (Signed)
On lopressor.  Follow pressures.  On recheck improved.

## 2016-08-18 ENCOUNTER — Ambulatory Visit
Admission: RE | Admit: 2016-08-18 | Discharge: 2016-08-18 | Disposition: A | Discharge status: Home / Self Care | Payer: Managed Care, Other (non HMO) | Source: Ambulatory Visit | Attending: Internal Medicine | Admitting: Internal Medicine

## 2016-08-18 DIAGNOSIS — Z1239 Encounter for other screening for malignant neoplasm of breast: Secondary | ICD-10-CM

## 2016-08-18 DIAGNOSIS — Z1231 Encounter for screening mammogram for malignant neoplasm of breast: Secondary | ICD-10-CM | POA: Diagnosis present

## 2016-09-01 DIAGNOSIS — I2510 Atherosclerotic heart disease of native coronary artery without angina pectoris: Secondary | ICD-10-CM | POA: Insufficient documentation

## 2016-09-01 DIAGNOSIS — I10 Essential (primary) hypertension: Secondary | ICD-10-CM | POA: Insufficient documentation

## 2016-09-01 DIAGNOSIS — I255 Ischemic cardiomyopathy: Secondary | ICD-10-CM | POA: Insufficient documentation

## 2016-09-11 ENCOUNTER — Other Ambulatory Visit: Payer: Self-pay | Admitting: Internal Medicine

## 2016-10-25 ENCOUNTER — Ambulatory Visit: Payer: Managed Care, Other (non HMO) | Admitting: Internal Medicine

## 2016-11-08 ENCOUNTER — Ambulatory Visit (INDEPENDENT_AMBULATORY_CARE_PROVIDER_SITE_OTHER): Payer: Managed Care, Other (non HMO) | Admitting: Internal Medicine

## 2016-11-08 ENCOUNTER — Encounter: Payer: Self-pay | Admitting: Internal Medicine

## 2016-11-08 DIAGNOSIS — E7800 Pure hypercholesterolemia, unspecified: Secondary | ICD-10-CM | POA: Diagnosis not present

## 2016-11-08 DIAGNOSIS — I700 Atherosclerosis of aorta: Secondary | ICD-10-CM

## 2016-11-08 DIAGNOSIS — F439 Reaction to severe stress, unspecified: Secondary | ICD-10-CM

## 2016-11-08 DIAGNOSIS — I252 Old myocardial infarction: Secondary | ICD-10-CM

## 2016-11-08 MED ORDER — METOPROLOL TARTRATE 25 MG PO TABS: 12.5000 mg | ORAL_TABLET | Freq: Two times a day (BID) | ORAL | 5 refills | Status: DC

## 2016-11-08 NOTE — Progress Notes (Signed)
Pre-visit discussion using our clinic review tool. No additional management support is needed unless otherwise documented below in the visit note.  

## 2016-11-08 NOTE — Progress Notes (Signed)
Patient ID: Leah Espinoza, female   DOB: 1956-06-04, 61 y.o.   MRN: [id number]   Subjective:    Patient ID: Leah Espinoza, female    DOB: [birth year], 61 y.o.   MRN: [id number]  HPI  Patient here for a scheduled follow up.  Is s/p anterior STEMI 12/23/15.  S/p PCI - stent LAD.  She was evaluated as an acute visit on 09/21/16 (for syncope).  Saw Dr Saralyn Pilar.  Had holter.  Predominant SR with mean heart rate 59.  ETT myoview - anterior apical scar without ischemia.  She reports no further episodes like that.  No chest pain.  No sob.  She does report some increased fatigue.  Increased stressed.  Discussed with her today.  Does not feel she needs any further intervention at this time.  Bowels stable.  Discussed diet and exercise.     Past Medical History:  Diagnosis Date  . History of chicken pox   . History of fainting   . Hyperlipidemia   . Hypertension   . MI (myocardial infarction)    Past Surgical History:  Procedure Laterality Date  . CARDIAC CATHETERIZATION N/A 12/23/2015   Procedure: Left Heart Cath and Coronary Angiography;  Surgeon: Isaias Cowman, MD;  Location: [place] [facility];  Service: Cardiovascular;  Laterality: N/A;  . CARDIAC CATHETERIZATION N/A 12/23/2015   Procedure: Coronary Stent Intervention;  Surgeon: Isaias Cowman, MD;  Location: [place] [facility];  Service: Cardiovascular;  Laterality: N/A;   Family History  Problem Relation Age of Onset  . Hyperlipidemia Mother   . Heart disease Mother   . Cancer Father     lung  . Sudden death Brother   . Arthritis Maternal Grandmother    Social History   Social History  . Marital status: Married    Spouse name: N/A  . Number of children: N/A  . Years of education: N/A   Social History Main Topics  . Smoking status: Former Research scientist (life sciences)  . Smokeless tobacco: Never Used  . Alcohol use No  . Drug use: No  . Sexual activity: Not Asked   Other Topics Concern  . None   Social History Narrative  . None     Outpatient Encounter Prescriptions as of 11/08/2016  Medication Sig  . aspirin EC 81 MG EC tablet Take 1 tablet (81 mg total) by mouth daily.  . metoprolol tartrate (LOPRESSOR) 25 MG tablet Take 0.5 tablets (12.5 mg total) by mouth 2 (two) times daily.  . rosuvastatin (CRESTOR) 20 MG tablet TAKE ONE TABLET BY MOUTH DAILY IN THE EVENING  . ticagrelor (BRILINTA) 90 MG TABS tablet Take 1 tablet (90 mg total) by mouth 2 (two) times daily.  . [DISCONTINUED] metoprolol tartrate (LOPRESSOR) 25 MG tablet Take 0.5 tablets (12.5 mg total) by mouth 2 (two) times daily.   No facility-administered encounter medications on file as of 11/08/2016.     Review of Systems  Constitutional: Positive for fatigue. Negative for appetite change and unexpected weight change.  HENT: Negative for congestion and sinus pressure.   Respiratory: Negative for cough, chest tightness and shortness of breath.   Cardiovascular: Negative for chest pain, palpitations and leg swelling.  Gastrointestinal: Negative for abdominal pain, diarrhea, nausea and vomiting.  Genitourinary: Negative for difficulty urinating and dysuria.  Musculoskeletal: Negative for back pain and joint swelling.  Skin: Negative for color change and rash.  Neurological: Negative for dizziness, light-headedness and headaches.  Psychiatric/Behavioral: Negative for agitation and dysphoric mood.  Objective:     Pulse 50  Physical Exam  Constitutional: She appears well-developed and well-nourished. No distress.  HENT:  Nose: Nose normal.  Mouth/Throat: Oropharynx is clear and moist.  Neck: Neck supple. No thyromegaly present.  Cardiovascular: Normal rate and regular rhythm.   Pulmonary/Chest: Breath sounds normal. No respiratory distress. She has no wheezes.  Abdominal: Soft. Bowel sounds are normal. There is no tenderness.  Musculoskeletal: She exhibits no edema or tenderness.  Lymphadenopathy:    She has no cervical adenopathy.  Skin: No  rash noted. No erythema.  Psychiatric: She has a normal mood and affect. Her behavior is normal.    BP 120/78 (BP Location: Left Arm, Patient Position: Sitting, Cuff Size: Large)   Pulse (!) 47   Temp 98.6 F (37 C) (Oral)   Resp 16   Ht 5\' 1"  (1.549 m)   Wt 142 lb 6.4 oz (64.6 kg)   SpO2 98%   BMI 26.91 kg/m  Wt Readings from Last 3 Encounters:  11/08/16 142 lb 6.4 oz (64.6 kg)  07/28/16 138 lb 9.6 oz (62.9 kg)  04/28/16 133 lb 4 oz (60.4 kg)     Lab Results  Component Value Date   WBC 6.0 02/27/2016   HGB 12.7 02/27/2016   HCT 37.5 02/27/2016   PLT 240 02/27/2016   GLUCOSE 93 07/28/2016   CHOL 185 07/23/2016   TRIG 66 07/23/2016   HDL 67 07/23/2016   LDLCALC 105 (H) 07/23/2016   ALT 24 07/23/2016   AST 26 07/23/2016   NA 139 07/28/2016   K 4.4 07/28/2016   CL 104 07/28/2016   CREATININE 0.96 07/28/2016   BUN 14 07/28/2016   CO2 28 07/28/2016   TSH 2.020 12/09/2015   INR 1.06 12/24/2015    Mm Digital Screening  Result Date: 08/19/2016 CLINICAL DATA:  Screening. EXAM: DIGITAL SCREENING BILATERAL MAMMOGRAM WITH CAD COMPARISON:  Previous exam(s). ACR Breast Density Category b: There are scattered areas of fibroglandular density. FINDINGS: There are no findings suspicious for malignancy. Images were processed with CAD. IMPRESSION: No mammographic evidence of malignancy. A result letter of this screening mammogram will be mailed directly to the patient. RECOMMENDATION: Screening mammogram in one year. (Code:SM-B-01Y) BI-RADS CATEGORY  1: Negative. Electronically Signed   By: Ammie Ferrier M.D.   On: 08/19/2016 08:57       Assessment & Plan:   Problem List Items Addressed This Visit    Aortic atherosclerosis ([place])    On crestor.        Relevant Medications   metoprolol tartrate (LOPRESSOR) 25 MG tablet   History of ST elevation myocardial infarction (STEMI)    Being followed by cardiology.  Recent w/up as outlined.  She feels is stable.  Follow. Continue  risk factor modification.        Relevant Orders   Basic metabolic panel   Hypercholesterolemia    On crestor.  Low cholesterol diet and exercise.  Follow lipid panel and liver function tests.        Relevant Medications   metoprolol tartrate (LOPRESSOR) 25 MG tablet   Other Relevant Orders   TSH   Lipid panel   Hepatic function panel   Stress    Increased stress.  Discussed with her today.  She does not feel needs any further w/up.  Follow.            Einar Pheasant, MD

## 2016-11-08 NOTE — Assessment & Plan Note (Signed)
Increased stress.  Discussed with her today.  She does not feel needs any further w/up.  Follow.

## 2016-11-08 NOTE — Assessment & Plan Note (Signed)
On crestor.  Low cholesterol diet and exercise.  Follow lipid panel and liver function tests.   

## 2016-11-20 ENCOUNTER — Encounter: Payer: Self-pay | Admitting: Internal Medicine

## 2016-11-20 NOTE — Assessment & Plan Note (Signed)
On crestor.   

## 2016-11-20 NOTE — Assessment & Plan Note (Signed)
Being followed by cardiology.  Recent w/up as outlined.  She feels is stable.  Follow. Continue risk factor modification.

## 2016-12-29 DIAGNOSIS — R55 Syncope and collapse: Secondary | ICD-10-CM | POA: Insufficient documentation

## 2017-02-10 ENCOUNTER — Other Ambulatory Visit: Payer: Self-pay | Admitting: Internal Medicine

## 2017-04-20 ENCOUNTER — Telehealth: Payer: Self-pay | Admitting: *Deleted

## 2017-04-20 NOTE — Telephone Encounter (Signed)
Called it is work number did not want to leave v/m

## 2017-04-20 NOTE — Telephone Encounter (Signed)
Patient stated that [facility] could not see orders, pt requested a hard copy to take in the morning  Please call pt when ready Pt contact [phone]

## 2017-04-20 NOTE — Telephone Encounter (Signed)
Patient has requested to have her labs placed to so that [facility] can see the labs Please call pt when this is completed  Pt contact [phone]

## 2017-04-20 NOTE — Addendum Note (Signed)
Addended by: Francella Solian on: 04/20/2017 08:53 AM   Modules accepted: Orders

## 2017-04-20 NOTE — Telephone Encounter (Signed)
Changed called patient and let her know.

## 2017-04-21 ENCOUNTER — Telehealth: Payer: Self-pay | Admitting: Internal Medicine

## 2017-04-21 DIAGNOSIS — E7800 Pure hypercholesterolemia, unspecified: Secondary | ICD-10-CM

## 2017-04-21 NOTE — Telephone Encounter (Signed)
Orders placed for [facility] labs.

## 2017-04-21 NOTE — Telephone Encounter (Signed)
Can you change to lab I will call when ready to pick up.

## 2017-04-21 NOTE — Telephone Encounter (Signed)
Patient informed she will come by and pick up.

## 2017-04-23 LAB — BASIC METABOLIC PANEL
BUN/Creatinine Ratio: 16 (ref 12–28)
BUN: 16 mg/dL (ref 8–27)
CALCIUM: 9.1 mg/dL (ref 8.7–10.3)
CO2: 25 mmol/L (ref 20–29)
CREATININE: 0.98 mg/dL (ref 0.57–1.00)
Chloride: 106 mmol/L (ref 96–106)
GFR calc Af Amer: 71 mL/min/{1.73_m2} (ref >59)
GFR, EST NON AFRICAN AMERICAN: 62 mL/min/{1.73_m2} (ref >59)
Glucose: 96 mg/dL (ref 65–99)
POTASSIUM: 4.4 mmol/L (ref 3.5–5.2)
Sodium: 142 mmol/L (ref 134–144)

## 2017-04-23 LAB — TSH: TSH: 2.24 u[IU]/mL (ref 0.450–4.500)

## 2017-04-23 LAB — LIPID PANEL
CHOLESTEROL TOTAL: 172 mg/dL (ref 100–199)
Chol/HDL Ratio: 2.8 ratio (ref 0.0–4.4)
HDL: 61 mg/dL (ref >39)
LDL CALC: 94 mg/dL (ref 0–99)
TRIGLYCERIDES: 86 mg/dL (ref 0–149)
VLDL Cholesterol Cal: 17 mg/dL (ref 5–40)

## 2017-04-23 LAB — HEPATIC FUNCTION PANEL
ALK PHOS: 74 IU/L (ref 39–117)
ALT: 25 IU/L (ref 0–32)
AST: 28 IU/L (ref 0–40)
Albumin: 4.1 g/dL (ref 3.6–4.8)
Bilirubin Total: 0.3 mg/dL (ref 0.0–1.2)
Bilirubin, Direct: 0.1 mg/dL (ref 0.00–0.40)
Total Protein: 7.3 g/dL (ref 6.0–8.5)

## 2017-04-25 ENCOUNTER — Encounter: Payer: Self-pay | Admitting: Internal Medicine

## 2017-04-25 ENCOUNTER — Ambulatory Visit (INDEPENDENT_AMBULATORY_CARE_PROVIDER_SITE_OTHER): Payer: Managed Care, Other (non HMO) | Admitting: Internal Medicine

## 2017-04-25 ENCOUNTER — Other Ambulatory Visit (HOSPITAL_COMMUNITY)
Admission: RE | Admit: 2017-04-25 | Discharge: 2017-04-25 | Disposition: A | Discharge status: Home / Self Care | Payer: Managed Care, Other (non HMO) | Source: Ambulatory Visit | Attending: Internal Medicine | Admitting: Internal Medicine

## 2017-04-25 VITALS — BP 122/76 | HR 54 | Temp 98.6°F | Resp 12 | Ht 62.0 in | Wt 150.0 lb

## 2017-04-25 DIAGNOSIS — Z0000 Encounter for general adult medical examination without abnormal findings: Secondary | ICD-10-CM

## 2017-04-25 DIAGNOSIS — Z124 Encounter for screening for malignant neoplasm of cervix: Secondary | ICD-10-CM | POA: Diagnosis not present

## 2017-04-25 DIAGNOSIS — I700 Atherosclerosis of aorta: Secondary | ICD-10-CM

## 2017-04-25 DIAGNOSIS — F439 Reaction to severe stress, unspecified: Secondary | ICD-10-CM | POA: Diagnosis not present

## 2017-04-25 DIAGNOSIS — E7800 Pure hypercholesterolemia, unspecified: Secondary | ICD-10-CM

## 2017-04-25 DIAGNOSIS — I2119 ST elevation (STEMI) myocardial infarction involving other coronary artery of inferior wall: Secondary | ICD-10-CM | POA: Diagnosis not present

## 2017-04-25 NOTE — Progress Notes (Signed)
Pre-visit discussion using our clinic review tool. No additional management support is needed unless otherwise documented below in the visit note.  

## 2017-04-25 NOTE — Progress Notes (Signed)
Patient ID: Leah Espinoza, female   DOB: [birth year], 61 y.o.   MRN: [id number]   Subjective:    Patient ID: Leah Espinoza, female    DOB: 01-10-1956, 61 y.o.   MRN: [id number]  HPI  Patient here for a physical exam.  She reports she is doing relatively well.  Trying to stay active.  Discussed the importance of diet and exercise.  No chest pain.  Overall she feels from a cardiac standpoint - stable.  Followed by Dr Saralyn Pilar.  Has f/u in 05/2017. Breathing stable.  No acid reflux.  No abdominal pain.  Bowels moving.  Discussed colonoscopy history.  Last 2015.  States mother had colon polyps.     Past Medical History:  Diagnosis Date  . History of chicken pox   . History of fainting   . Hyperlipidemia   . Hypertension   . MI (myocardial infarction) [facility])    Past Surgical History:  Procedure Laterality Date  . CARDIAC CATHETERIZATION N/A 12/23/2015   Procedure: Left Heart Cath and Coronary Angiography;  Surgeon: Isaias Cowman, MD;  Location: [place] [facility];  Service: Cardiovascular;  Laterality: N/A;  . CARDIAC CATHETERIZATION N/A 12/23/2015   Procedure: Coronary Stent Intervention;  Surgeon: Isaias Cowman, MD;  Location: [place] [facility];  Service: Cardiovascular;  Laterality: N/A;   Family History  Problem Relation Age of Onset  . Hyperlipidemia Mother   . Heart disease Mother   . Cancer Father        lung  . Sudden death Brother   . Arthritis Maternal Grandmother    Social History   Social History  . Marital status: Married    Spouse name: N/A  . Number of children: N/A  . Years of education: N/A   Social History Main Topics  . Smoking status: Former Research scientist (life sciences)  . Smokeless tobacco: Never Used  . Alcohol use No  . Drug use: No  . Sexual activity: Not Asked   Other Topics Concern  . None   Social History Narrative  . None    Outpatient Encounter Prescriptions as of 04/25/2017  Medication Sig  . aspirin EC 81 MG EC tablet Take 1 tablet (81 mg  total) by mouth daily.  . metoprolol tartrate (LOPRESSOR) 25 MG tablet Take 0.5 tablets (12.5 mg total) by mouth 2 (two) times daily.  . rosuvastatin (CRESTOR) 20 MG tablet TAKE ONE TABLET BY MOUTH DAILY IN THE EVENING  . ticagrelor (BRILINTA) 90 MG TABS tablet Take 1 tablet (90 mg total) by mouth 2 (two) times daily.   No facility-administered encounter medications on file as of 04/25/2017.    Review of Systems  Constitutional: Negative for appetite change and unexpected weight change.  HENT: Negative for congestion and sinus pressure.   Eyes: Negative for pain and visual disturbance.  Respiratory: Negative for cough, chest tightness and shortness of breath.   Cardiovascular: Negative for chest pain, palpitations and leg swelling.  Gastrointestinal: Negative for abdominal pain, diarrhea, nausea and vomiting.  Genitourinary: Negative for difficulty urinating and dysuria.  Musculoskeletal: Negative for back pain and joint swelling.  Skin: Negative for color change and rash.  Neurological: Negative for dizziness, light-headedness and headaches.  Hematological: Negative for adenopathy. Does not bruise/bleed easily.  Psychiatric/Behavioral: Negative for agitation and dysphoric mood.       Objective:    Physical Exam  Constitutional: She is oriented to person, place, and time. She appears well-developed and well-nourished. No distress.  HENT:  Nose: Nose normal.  Mouth/Throat: Oropharynx is clear and moist.  Eyes: Right eye exhibits no discharge. Left eye exhibits no discharge. No scleral icterus.  Neck: Neck supple. No thyromegaly present.  Cardiovascular: Normal rate and regular rhythm.   Pulmonary/Chest: Breath sounds normal. No accessory muscle usage. No tachypnea. No respiratory distress. She has no decreased breath sounds. She has no wheezes. She has no rhonchi. Right breast exhibits no inverted nipple, no mass, no nipple discharge and no tenderness (no axillary adenopathy). Left  breast exhibits no inverted nipple, no mass, no nipple discharge and no tenderness (no axilarry adenopathy).  Abdominal: Soft. Bowel sounds are normal. There is no tenderness.  Musculoskeletal: She exhibits no edema or tenderness.  Lymphadenopathy:    She has no cervical adenopathy.  Neurological: She is alert and oriented to person, place, and time.  Skin: Skin is warm. No rash noted. No erythema.  Psychiatric: She has a normal mood and affect. Her behavior is normal.    BP 122/76 (BP Location: Left Arm, Patient Position: Sitting, Cuff Size: Normal)   Pulse (!) 54   Temp 98.6 F (37 C) (Oral)   Resp 12   Ht 5\' 2"  (1.575 m)   Wt 150 lb (68 kg)   SpO2 98%   BMI 27.44 kg/m  Wt Readings from Last 3 Encounters:  04/25/17 150 lb (68 kg)  11/08/16 142 lb 6.4 oz (64.6 kg)  07/28/16 138 lb 9.6 oz (62.9 kg)     Lab Results  Component Value Date   WBC 6.0 02/27/2016   HGB 12.7 02/27/2016   HCT 37.5 02/27/2016   PLT 240 02/27/2016   GLUCOSE 96 04/22/2017   CHOL 172 04/22/2017   TRIG 86 04/22/2017   HDL 61 04/22/2017   LDLCALC 94 04/22/2017   ALT 25 04/22/2017   AST 28 04/22/2017   NA 142 04/22/2017   K 4.4 04/22/2017   CL 106 04/22/2017   CREATININE 0.98 04/22/2017   BUN 16 04/22/2017   CO2 25 04/22/2017   TSH 2.240 04/22/2017   INR 1.06 12/24/2015    Mm Digital Screening  Result Date: 08/19/2016 CLINICAL DATA:  Screening. EXAM: DIGITAL SCREENING BILATERAL MAMMOGRAM WITH CAD COMPARISON:  Previous exam(s). ACR Breast Density Category b: There are scattered areas of fibroglandular density. FINDINGS: There are no findings suspicious for malignancy. Images were processed with CAD. IMPRESSION: No mammographic evidence of malignancy. A result letter of this screening mammogram will be mailed directly to the patient. RECOMMENDATION: Screening mammogram in one year. (Code:SM-B-01Y) BI-RADS CATEGORY  1: Negative. Electronically Signed   By: Ammie Ferrier M.D.   On: 08/19/2016  08:57       Assessment & Plan:   Problem List Items Addressed This Visit    Aortic atherosclerosis ([place])    On crestor.        Health care maintenance    Physical today 04/25/17.  PAP 04/25/17.  Mammogram 08/19/16 - Birads I.  Colonoscopy 12/2013.  States recommended f/u in 10 years.  Mother with polyps.  Need to confirm with GI when due.        Hypercholesterolemia    On crestor.  Cholesterol improved.  Desires not to increase the medication.  Follow.       Relevant Orders   CBC with Differential/Platelet   Hepatic function panel   Lipid panel   Basic metabolic panel   ST elevation myocardial infarction (STEMI) of inferior wall, initial episode of care [facility])    S/p DES 12/2015.  Followed by  cardiology.  Doing well.  Continue aggressive risk factor modification.        Stress    Increased stress.  Overall she feels she is handling things relatively well.  Does not feel needs anything more at this time.  Follow.         Other Visit Diagnoses    Routine general medical examination at a health care facility    -  Primary   Screening for cervical cancer       Relevant Orders   Cytology - PAP       Einar Pheasant, MD

## 2017-04-26 ENCOUNTER — Encounter: Payer: Self-pay | Admitting: Internal Medicine

## 2017-04-26 NOTE — Assessment & Plan Note (Signed)
S/p DES 12/2015.  Followed by cardiology.  Doing well.  Continue aggressive risk factor modification.

## 2017-04-26 NOTE — Assessment & Plan Note (Signed)
On crestor.  Cholesterol improved.  Desires not to increase the medication.  Follow.

## 2017-04-26 NOTE — Assessment & Plan Note (Signed)
On crestor.   

## 2017-04-26 NOTE — Assessment & Plan Note (Signed)
Physical today 04/25/17.  PAP 04/25/17.  Mammogram 08/19/16 - Birads I.  Colonoscopy 12/2013.  States recommended f/u in 10 years.  Mother with polyps.  Need to confirm with GI when due.

## 2017-04-26 NOTE — Assessment & Plan Note (Signed)
Increased stress.  Overall she feels she is handling things relatively well.  Does not feel needs anything more at this time.  Follow.

## 2017-04-28 LAB — CYTOLOGY - PAP
DIAGNOSIS: NEGATIVE
HPV: NOT DETECTED

## 2017-06-17 ENCOUNTER — Other Ambulatory Visit: Payer: Self-pay | Admitting: Internal Medicine

## 2017-07-25 ENCOUNTER — Encounter: Payer: Self-pay | Admitting: *Deleted

## 2017-07-26 NOTE — Discharge Instructions (Signed)

## 2017-07-27 ENCOUNTER — Encounter
Admission: RE | Disposition: A | Discharge status: Home / Self Care | Payer: Self-pay | Source: Ambulatory Visit | Attending: Ophthalmology

## 2017-07-27 ENCOUNTER — Ambulatory Visit: Payer: Managed Care, Other (non HMO) | Admitting: Anesthesiology

## 2017-07-27 ENCOUNTER — Ambulatory Visit
Admission: RE | Admit: 2017-07-27 | Discharge: 2017-07-27 | Disposition: A | Discharge status: Home / Self Care | Payer: Managed Care, Other (non HMO) | Source: Ambulatory Visit | Attending: Ophthalmology | Admitting: Ophthalmology

## 2017-07-27 DIAGNOSIS — I252 Old myocardial infarction: Secondary | ICD-10-CM | POA: Insufficient documentation

## 2017-07-27 DIAGNOSIS — Z79899 Other long term (current) drug therapy: Secondary | ICD-10-CM | POA: Diagnosis not present

## 2017-07-27 DIAGNOSIS — H2511 Age-related nuclear cataract, right eye: Secondary | ICD-10-CM | POA: Insufficient documentation

## 2017-07-27 DIAGNOSIS — I10 Essential (primary) hypertension: Secondary | ICD-10-CM | POA: Diagnosis not present

## 2017-07-27 DIAGNOSIS — Z955 Presence of coronary angioplasty implant and graft: Secondary | ICD-10-CM | POA: Diagnosis not present

## 2017-07-27 DIAGNOSIS — Z87891 Personal history of nicotine dependence: Secondary | ICD-10-CM | POA: Diagnosis not present

## 2017-07-27 HISTORY — DX: Family history of other specified conditions: Z84.89

## 2017-07-27 HISTORY — PX: CATARACT EXTRACTION W/PHACO: SHX586

## 2017-07-27 HISTORY — DX: Motion sickness, initial encounter: T75.3XXA

## 2017-07-27 HISTORY — DX: Dizziness and giddiness: R42

## 2017-07-27 SURGERY — PHACOEMULSIFICATION, CATARACT, WITH IOL INSERTION
Anesthesia: Topical | Laterality: Right | Wound class: Clean

## 2017-07-27 MED ORDER — MOXIFLOXACIN HCL 0.5 % OP SOLN
1.0000 [drp] | OPHTHALMIC | Status: DC | PRN
  Administered 2017-07-27 (×3): 1 [drp] via OPHTHALMIC

## 2017-07-27 MED ORDER — NA HYALUR & NA CHOND-NA HYALUR 0.4-0.35 ML IO KIT
PACK | INTRAOCULAR | Status: DC | PRN
  Administered 2017-07-27: 1 mL via INTRAOCULAR

## 2017-07-27 MED ORDER — LACTATED RINGERS IV SOLN: 10.0000 mL/h | INTRAVENOUS | Status: DC

## 2017-07-27 MED ORDER — ARMC OPHTHALMIC DILATING DROPS
1.0000 | OPHTHALMIC | Status: DC | PRN
Start: 2017-07-27 — End: 2017-07-27
  Administered 2017-07-27 (×3): 1 via OPHTHALMIC

## 2017-07-27 MED ORDER — FENTANYL CITRATE (PF) 100 MCG/2ML IJ SOLN
INTRAMUSCULAR | Status: DC | PRN
  Administered 2017-07-27: 50 ug via INTRAVENOUS

## 2017-07-27 MED ORDER — MIDAZOLAM HCL 2 MG/2ML IJ SOLN
INTRAMUSCULAR | Status: DC | PRN
  Administered 2017-07-27: 2 mg via INTRAVENOUS

## 2017-07-27 MED ORDER — LIDOCAINE HCL (PF) 2 % IJ SOLN
INTRAOCULAR | Status: DC | PRN
  Administered 2017-07-27: 1 mL via INTRAMUSCULAR

## 2017-07-27 MED ORDER — CEFUROXIME OPHTHALMIC INJECTION 1 MG/0.1 ML
INJECTION | OPHTHALMIC | Status: DC | PRN
  Administered 2017-07-27: 0.1 mL via INTRACAMERAL

## 2017-07-27 MED ORDER — EPINEPHRINE PF 1 MG/ML IJ SOLN
INTRAOCULAR | Status: DC | PRN
  Administered 2017-07-27: 70 mL via OPHTHALMIC

## 2017-07-27 MED ORDER — BRIMONIDINE TARTRATE-TIMOLOL 0.2-0.5 % OP SOLN
OPHTHALMIC | Status: DC | PRN
  Administered 2017-07-27: 1 [drp] via OPHTHALMIC

## 2017-07-27 SURGICAL SUPPLY — 25 items
CANNULA ANT/CHMB 27GA (MISCELLANEOUS) ×3 IMPLANT
CARTRIDGE ABBOTT (MISCELLANEOUS) IMPLANT
Extended Range of Vion IOL TORIC (Intraocular Lens) ×3 IMPLANT
GLOVE SURG LX 7.5 STRW (GLOVE) ×2
GLOVE SURG LX STRL 7.5 STRW (GLOVE) ×1 IMPLANT
GLOVE SURG TRIUMPH 8.0 PF LTX (GLOVE) ×3 IMPLANT
GOWN STRL REUS W/ TWL LRG LVL3 (GOWN DISPOSABLE) ×2 IMPLANT
GOWN STRL REUS W/TWL LRG LVL3 (GOWN DISPOSABLE) ×4
MARKER SKIN DUAL TIP RULER LAB (MISCELLANEOUS) ×3 IMPLANT
NDL RETROBULBAR .5 NSTRL (NEEDLE) IMPLANT
NEEDLE FILTER BLUNT 18X 1/2SAF (NEEDLE) ×2
NEEDLE FILTER BLUNT 18X1 1/2 (NEEDLE) ×1 IMPLANT
PACK CATARACT BRASINGTON (MISCELLANEOUS) ×3 IMPLANT
PACK EYE AFTER SURG (MISCELLANEOUS) ×3 IMPLANT
PACK OPTHALMIC (MISCELLANEOUS) ×3 IMPLANT
RING MALYGIN 7.0 (MISCELLANEOUS) IMPLANT
SUT ETHILON 10-0 CS-B-6CS-B-6 (SUTURE)
SUT VICRYL  9 0 (SUTURE)
SUT VICRYL 9 0 (SUTURE) IMPLANT
SUTURE EHLN 10-0 CS-B-6CS-B-6 (SUTURE) IMPLANT
SYR 3ML LL SCALE MARK (SYRINGE) ×3 IMPLANT
SYR 5ML LL (SYRINGE) ×3 IMPLANT
SYR TB 1ML LUER SLIP (SYRINGE) ×3 IMPLANT
WATER STERILE IRR 250ML POUR (IV SOLUTION) ×3 IMPLANT
WIPE NON LINTING 3.25X3.25 (MISCELLANEOUS) ×3 IMPLANT

## 2017-07-27 NOTE — Anesthesia Procedure Notes (Signed)
Procedure Name: MAC Performed by: Wassim Kirksey, CRNA Pre-anesthesia Checklist: Patient identified, Emergency Drugs available, Suction available, Timeout performed and Patient being monitored Patient Re-evaluated:Patient Re-evaluated prior to induction Oxygen Delivery Method: Nasal cannula Placement Confirmation: positive ETCO2       

## 2017-07-27 NOTE — Anesthesia Postprocedure Evaluation (Signed)
Anesthesia Post Note  Patient: Leah Espinoza  Procedure(s) Performed: CATARACT EXTRACTION PHACO AND INTRAOCULAR LENS PLACEMENT (IOC) RIGHT SYMFONY LENS (Right )  Patient location during evaluation: PACU Anesthesia Type: MAC Level of consciousness: awake and alert Pain management: pain level controlled Vital Signs Assessment: post-procedure vital signs reviewed and stable Respiratory status: spontaneous breathing Cardiovascular status: blood pressure returned to baseline Postop Assessment: no headache Anesthetic complications: no    Jaci Standard, III,  Teonia Yager D

## 2017-07-27 NOTE — Op Note (Signed)
LOCATION:  [place]   PREOPERATIVE DIAGNOSIS:  Nuclear sclerotic cataract of the right eye.  H25.11   POSTOPERATIVE DIAGNOSIS:  Nuclear sclerotic cataract of the right eye.   PROCEDURE:  Phacoemulsification with Toric posterior chamber intraocular lens placement of the right eye.   LENS:   Implant Name Type Inv. Item Serial No. Manufacturer Lot No. LRB No. Used  Extended Range of Vion IOL TORIC Intraocular Lens  [id number] [employer]  Right 1     [id number] 27.0 D Symfony Toric intraocular lens with 1.50 diopters of cylindrical power with axis orientation at 177 degrees.   ULTRASOUND TIME: 11 % of 0 minutes, 40 seconds.  CDE 4.7   SURGEON:  Wyonia Hough, MD   ANESTHESIA: Topical with tetracaine drops and 2% Xylocaine jelly, augmented with 1% preservative-free intracameral lidocaine. .   COMPLICATIONS:  None.   DESCRIPTION OF PROCEDURE:  The patient was identified in the holding room and transported to the operating suite and placed in the supine position under the operating microscope.  The right eye was identified as the operative eye, and it was prepped and draped in the usual sterile ophthalmic fashion.    A clear-corneal paracentesis incision was made at the 12:00 position.  0.5 ml of preservative-free 1% lidocaine was injected into the anterior chamber. The anterior chamber was filled with Viscoat.  A 2.4 millimeter near clear corneal incision was then made at the 9:00 position.  A cystotome and capsulorrhexis forceps were then used to make a curvilinear capsulorrhexis.  Hydrodissection and hydrodelineation were then performed using balanced salt solution.   Phacoemulsification was then used in stop and chop fashion to remove the lens, nucleus and epinucleus.  The remaining cortex was aspirated using the irrigation and aspiration handpiece.  Provisc viscoelastic was then placed into the capsular bag to distend it for lens placement.  The Verion digital  marker was used to align the implant at the intended axis.   A Toric lens was then injected into the capsular bag.  It was rotated clockwise until the axis marks on the lens were approximately 15 degrees in the counterclockwise direction to the intended alignment.  The viscoelastic was aspirated from the eye using the irrigation aspiration handpiece.  Then, a Koch spatula through the sideport incision was used to rotate the lens in a clockwise direction until the axis markings of the intraocular lens were lined up with the Verion alignment.  Balanced salt solution was then used to hydrate the wounds. Cefuroxime 0.1 ml of a 10mg /ml solution was injected into the anterior chamber for a dose of 1 mg of intracameral antibiotic at the completion of the case.    The eye was noted to have a physiologic pressure and there was no wound leak noted.   Timolol and Brimonidine drops were applied to the eye.  The patient was taken to the recovery room in stable condition having had no complications of anesthesia or surgery.  Colbert Curenton 07/27/2017, 10:17 AM

## 2017-07-27 NOTE — H&P (Signed)
The History and Physical notes are on paper, have been signed, and are to be scanned. The patient remains stable and unchanged from the H&P.   Previous H&P reviewed, patient examined, and there are no changes.  Leah Espinoza 07/27/2017 9:20 AM

## 2017-07-27 NOTE — Transfer of Care (Signed)
Immediate Anesthesia Transfer of Care Note  Patient: Leah Espinoza  Procedure(s) Performed: CATARACT EXTRACTION PHACO AND INTRAOCULAR LENS PLACEMENT (IOC) RIGHT SYMFONY LENS (Right )  Patient Location: PACU  Anesthesia Type: No value filed.  Level of Consciousness: awake, alert  and patient cooperative  Airway and Oxygen Therapy: Patient Spontanous Breathing and Patient connected to supplemental oxygen  Post-op Assessment: Post-op Vital signs reviewed, Patient's Cardiovascular Status Stable, Respiratory Function Stable, Patent Airway and No signs of Nausea or vomiting  Post-op Vital Signs: Reviewed and stable  Complications: No apparent anesthesia complications

## 2017-07-27 NOTE — Anesthesia Preprocedure Evaluation (Signed)
Anesthesia Evaluation  Patient identified by MRN, date of birth, ID band Patient awake    Reviewed: Allergy & Precautions, H&P , NPO status , Patient's Chart, lab work & pertinent test results  Airway Mallampati: II  TM Distance: >3 FB Neck ROM: full    Dental no notable dental hx.    Pulmonary former smoker,    Pulmonary exam normal        Cardiovascular hypertension, On Medications + Past MI  Normal cardiovascular exam  MI one year ago, stents placed and maintained on dual antiplatelet therapy.  HR 40s today- BP normal, no CP/SOB or dizziness this am.  Is still taking her metoprolol.   Neuro/Psych    GI/Hepatic negative GI ROS, Neg liver ROS,   Endo/Other  negative endocrine ROS  Renal/GU negative Renal ROS     Musculoskeletal   Abdominal   Peds  Hematology negative hematology ROS (+)   Anesthesia Other Findings   Reproductive/Obstetrics                             Anesthesia Physical Anesthesia Plan  ASA: III  Anesthesia Plan:    Post-op Pain Management:    Induction:   PONV Risk Score and Plan:   Airway Management Planned:   Additional Equipment:   Intra-op Plan:   Post-operative Plan:   Informed Consent: I have reviewed the patients History and Physical, chart, labs and discussed the procedure including the risks, benefits and alternatives for the proposed anesthesia with the patient or authorized representative who has indicated his/her understanding and acceptance.     Plan Discussed with:   Anesthesia Plan Comments:         Anesthesia Quick Evaluation

## 2017-07-28 ENCOUNTER — Encounter: Payer: Self-pay | Admitting: Ophthalmology

## 2017-08-02 NOTE — Discharge Instructions (Signed)

## 2017-08-10 ENCOUNTER — Encounter
Admission: RE | Disposition: A | Discharge status: Home / Self Care | Payer: Self-pay | Source: Ambulatory Visit | Attending: Ophthalmology

## 2017-08-10 ENCOUNTER — Ambulatory Visit: Payer: Managed Care, Other (non HMO) | Admitting: Anesthesiology

## 2017-08-10 ENCOUNTER — Ambulatory Visit
Admission: RE | Admit: 2017-08-10 | Discharge: 2017-08-10 | Disposition: A | Discharge status: Home / Self Care | Payer: Managed Care, Other (non HMO) | Source: Ambulatory Visit | Attending: Ophthalmology | Admitting: Ophthalmology

## 2017-08-10 DIAGNOSIS — I10 Essential (primary) hypertension: Secondary | ICD-10-CM | POA: Diagnosis not present

## 2017-08-10 DIAGNOSIS — I252 Old myocardial infarction: Secondary | ICD-10-CM | POA: Diagnosis not present

## 2017-08-10 DIAGNOSIS — H2512 Age-related nuclear cataract, left eye: Secondary | ICD-10-CM | POA: Diagnosis present

## 2017-08-10 DIAGNOSIS — E7800 Pure hypercholesterolemia, unspecified: Secondary | ICD-10-CM | POA: Diagnosis not present

## 2017-08-10 DIAGNOSIS — Z79899 Other long term (current) drug therapy: Secondary | ICD-10-CM | POA: Insufficient documentation

## 2017-08-10 DIAGNOSIS — Z7982 Long term (current) use of aspirin: Secondary | ICD-10-CM | POA: Insufficient documentation

## 2017-08-10 DIAGNOSIS — Z87891 Personal history of nicotine dependence: Secondary | ICD-10-CM | POA: Diagnosis not present

## 2017-08-10 DIAGNOSIS — Z955 Presence of coronary angioplasty implant and graft: Secondary | ICD-10-CM | POA: Diagnosis not present

## 2017-08-10 HISTORY — PX: CATARACT EXTRACTION W/PHACO: SHX586

## 2017-08-10 SURGERY — PHACOEMULSIFICATION, CATARACT, WITH IOL INSERTION
Anesthesia: Topical | Laterality: Left | Wound class: Clean

## 2017-08-10 MED ORDER — LACTATED RINGERS IV BOLUS (SEPSIS)
500.0000 mL | Freq: Once | INTRAVENOUS | Status: AC
  Administered 2017-08-10: 500 mL via INTRAVENOUS

## 2017-08-10 MED ORDER — ONDANSETRON HCL 4 MG/2ML IJ SOLN
4.0000 mg | Freq: Once | INTRAMUSCULAR | Status: AC
  Administered 2017-08-10: 4 mg via INTRAVENOUS

## 2017-08-10 MED ORDER — FENTANYL CITRATE (PF) 100 MCG/2ML IJ SOLN
INTRAMUSCULAR | Status: DC | PRN
  Administered 2017-08-10: 100 ug via INTRAVENOUS

## 2017-08-10 MED ORDER — BRIMONIDINE TARTRATE-TIMOLOL 0.2-0.5 % OP SOLN
OPHTHALMIC | Status: DC | PRN
  Administered 2017-08-10: 1 [drp] via OPHTHALMIC

## 2017-08-10 MED ORDER — LIDOCAINE HCL (PF) 2 % IJ SOLN
INTRAOCULAR | Status: DC | PRN
  Administered 2017-08-10: 1 mL via INTRAMUSCULAR

## 2017-08-10 MED ORDER — OXYCODONE HCL 5 MG/5ML PO SOLN
5.0000 mg | Freq: Once | ORAL | Status: DC | PRN
Start: 2017-08-10 — End: 2017-08-10

## 2017-08-10 MED ORDER — MEPERIDINE HCL 25 MG/ML IJ SOLN: 6.2500 mg | INTRAMUSCULAR | Status: DC | PRN

## 2017-08-10 MED ORDER — PROMETHAZINE HCL 25 MG/ML IJ SOLN: 6.2500 mg | INTRAMUSCULAR | Status: DC | PRN

## 2017-08-10 MED ORDER — ARMC OPHTHALMIC DILATING DROPS
1.0000 "application " | OPHTHALMIC | Status: DC | PRN
  Administered 2017-08-10 (×3): 1 via OPHTHALMIC

## 2017-08-10 MED ORDER — OXYCODONE HCL 5 MG PO TABS: 5.0000 mg | ORAL_TABLET | Freq: Once | ORAL | Status: DC | PRN

## 2017-08-10 MED ORDER — NA HYALUR & NA CHOND-NA HYALUR 0.4-0.35 ML IO KIT
PACK | INTRAOCULAR | Status: DC | PRN
  Administered 2017-08-10: 1 mL via INTRAOCULAR

## 2017-08-10 MED ORDER — CEFUROXIME OPHTHALMIC INJECTION 1 MG/0.1 ML
INJECTION | OPHTHALMIC | Status: DC | PRN
  Administered 2017-08-10: 0.1 mL via INTRACAMERAL

## 2017-08-10 MED ORDER — MIDAZOLAM HCL 2 MG/2ML IJ SOLN
INTRAMUSCULAR | Status: DC | PRN
  Administered 2017-08-10: 2 mg via INTRAVENOUS

## 2017-08-10 MED ORDER — BSS IO SOLN
INTRAOCULAR | Status: DC | PRN
  Administered 2017-08-10: 57 mL via OPHTHALMIC

## 2017-08-10 MED ORDER — FENTANYL CITRATE (PF) 100 MCG/2ML IJ SOLN: 25.0000 ug | INTRAMUSCULAR | Status: DC | PRN

## 2017-08-10 MED ORDER — MOXIFLOXACIN HCL 0.5 % OP SOLN
1.0000 [drp] | OPHTHALMIC | Status: DC | PRN
  Administered 2017-08-10 (×3): 1 [drp] via OPHTHALMIC

## 2017-08-10 SURGICAL SUPPLY — 25 items
CANNULA ANT/CHMB 27GA (MISCELLANEOUS) ×3 IMPLANT
CARTRIDGE ABBOTT (MISCELLANEOUS) IMPLANT
GLOVE SURG LX 7.5 STRW (GLOVE) ×2
GLOVE SURG LX STRL 7.5 STRW (GLOVE) ×1 IMPLANT
GLOVE SURG TRIUMPH 8.0 PF LTX (GLOVE) ×3 IMPLANT
GOWN STRL REUS W/ TWL LRG LVL3 (GOWN DISPOSABLE) ×2 IMPLANT
GOWN STRL REUS W/TWL LRG LVL3 (GOWN DISPOSABLE) ×4
MARKER SKIN DUAL TIP RULER LAB (MISCELLANEOUS) ×3 IMPLANT
NDL RETROBULBAR .5 NSTRL (NEEDLE) IMPLANT
NEEDLE FILTER BLUNT 18X 1/2SAF (NEEDLE) ×2
NEEDLE FILTER BLUNT 18X1 1/2 (NEEDLE) ×1 IMPLANT
PACK CATARACT BRASINGTON (MISCELLANEOUS) ×3 IMPLANT
PACK EYE AFTER SURG (MISCELLANEOUS) ×3 IMPLANT
PACK OPTHALMIC (MISCELLANEOUS) ×3 IMPLANT
RING MALYGIN 7.0 (MISCELLANEOUS) IMPLANT
SUT ETHILON 10-0 CS-B-6CS-B-6 (SUTURE)
SUT VICRYL  9 0 (SUTURE)
SUT VICRYL 9 0 (SUTURE) IMPLANT
SUTURE EHLN 10-0 CS-B-6CS-B-6 (SUTURE) IMPLANT
SYR 3ML LL SCALE MARK (SYRINGE) ×3 IMPLANT
SYR 5ML LL (SYRINGE) ×3 IMPLANT
SYR TB 1ML LUER SLIP (SYRINGE) ×3 IMPLANT
Tecnis Symfony Toric IOL (Intraocular Lens) ×3 IMPLANT
WATER STERILE IRR 250ML POUR (IV SOLUTION) ×3 IMPLANT
WIPE NON LINTING 3.25X3.25 (MISCELLANEOUS) ×3 IMPLANT

## 2017-08-10 NOTE — H&P (Signed)
The History and Physical notes are on paper, have been signed, and are to be scanned. The patient remains stable and unchanged from the H&P.   Previous H&P reviewed, patient examined, and there are no changes.  Darran Gabay 08/10/2017 9:33 AM

## 2017-08-10 NOTE — Anesthesia Preprocedure Evaluation (Signed)
Anesthesia Evaluation  Patient identified by MRN, date of birth, ID band Patient awake    Reviewed: Allergy & Precautions, H&P , NPO status , Patient's Chart, lab work & pertinent test results  Airway Mallampati: II  TM Distance: >3 FB Neck ROM: full    Dental no notable dental hx.    Pulmonary former smoker,    Pulmonary exam normal        Cardiovascular hypertension, On Medications + Past MI  Normal cardiovascular exam  MI one year ago, stents placed and maintained on dual antiplatelet therapy.  HR 40s today- BP normal, no CP/SOB or dizziness this am.  Is still taking her metoprolol.   Neuro/Psych    GI/Hepatic negative GI ROS, Neg liver ROS,   Endo/Other  negative endocrine ROS  Renal/GU negative Renal ROS     Musculoskeletal   Abdominal   Peds  Hematology negative hematology ROS (+)   Anesthesia Other Findings   Reproductive/Obstetrics                             Anesthesia Physical  Anesthesia Plan  ASA: III  Anesthesia Plan:    Post-op Pain Management:    Induction:   PONV Risk Score and Plan:   Airway Management Planned:   Additional Equipment:   Intra-op Plan:   Post-operative Plan:   Informed Consent: I have reviewed the patients History and Physical, chart, labs and discussed the procedure including the risks, benefits and alternatives for the proposed anesthesia with the patient or authorized representative who has indicated his/her understanding and acceptance.     Plan Discussed with:   Anesthesia Plan Comments:         Anesthesia Quick Evaluation

## 2017-08-10 NOTE — Anesthesia Postprocedure Evaluation (Signed)
Anesthesia Post Note  Patient: Leah Espinoza  Procedure(s) Performed: CATARACT EXTRACTION PHACO AND INTRAOCULAR LENS PLACEMENT (IOC) LEFT SYMFONY TORIC LENS (Left )  Patient location during evaluation: PACU Anesthesia Type: MAC Level of consciousness: awake and alert Pain management: pain level controlled Vital Signs Assessment: post-procedure vital signs reviewed and stable Respiratory status: spontaneous breathing, nonlabored ventilation, respiratory function stable and patient connected to nasal cannula oxygen Cardiovascular status: stable and blood pressure returned to baseline Postop Assessment: no apparent nausea or vomiting Anesthetic complications: no    SCOURAS, NICOLE ELAINE

## 2017-08-10 NOTE — Anesthesia Procedure Notes (Signed)
Procedure Name: MAC Performed by: Coraline Talwar, CRNA Pre-anesthesia Checklist: Patient identified, Emergency Drugs available, Suction available, Timeout performed and Patient being monitored Patient Re-evaluated:Patient Re-evaluated prior to induction Oxygen Delivery Method: Nasal cannula Placement Confirmation: positive ETCO2       

## 2017-08-10 NOTE — Op Note (Signed)
LOCATION:  [place]   PREOPERATIVE DIAGNOSIS:  Nuclear sclerotic cataract of the left eye.  H25.12  POSTOPERATIVE DIAGNOSIS:  Nuclear sclerotic cataract of the left eye.   PROCEDURE:  Phacoemulsification with Toric posterior chamber intraocular lens placement of the left eye.   LENS:  Implant Name Type Inv. Item Serial No. Manufacturer Lot No. LRB No. Used  Tecnis Symfony Toric IOL Intraocular Lens  [id number] [employer]  Left 1   [id number] 27.5 D Symfony Toric intraocular lens with 3.0 diopters of cylindrical power with axis orientation at 12 degrees.   ULTRASOUND TIME: 10 % of 0 minutes, 37 seconds.  CDE 3.8   SURGEON:  Wyonia Hough, MD   ANESTHESIA:  Topical with tetracaine drops and 2% Xylocaine jelly, augmented with 1% preservative-free intracameral lidocaine.  COMPLICATIONS:  None.   DESCRIPTION OF PROCEDURE:  The patient was identified in the holding room and transported to the operating suite and placed in the supine position under the operating microscope.  The left eye was identified as the operative eye, and it was prepped and draped in the usual sterile ophthalmic fashion.    A clear-corneal paracentesis incision was made at the 1:30 position.  0.5 ml of preservative-free 1% lidocaine was injected into the anterior chamber. The anterior chamber was filled with Viscoat.  A 2.4 millimeter near clear corneal incision was then made at the 10:30 position.  A cystotome and capsulorrhexis forceps were then used to make a curvilinear capsulorrhexis.  Hydrodissection and hydrodelineation were then performed using balanced salt solution.   Phacoemulsification was then used in stop and chop fashion to remove the lens, nucleus and epinucleus.  The remaining cortex was aspirated using the irrigation and aspiration handpiece.  Provisc viscoelastic was then placed into the capsular bag to distend it for lens placement.  The Verion digital marker was used to align  the implant at the intended axis.   A 27.5 diopter lens was then injected into the capsular bag.  It was rotated clockwise until the axis marks on the lens were approximately 15 degrees in the counterclockwise direction to the intended alignment.  The viscoelastic was aspirated from the eye using the irrigation aspiration handpiece.  Then, a Koch spatula through the sideport incision was used to rotate the lens in a clockwise direction until the axis markings of the intraocular lens were lined up with the Verion alignment.  Balanced salt solution was then used to hydrate the wounds. Cefuroxime 0.1 ml of a 10mg /ml solution was injected into the anterior chamber for a dose of 1 mg of intracameral antibiotic at the completion of the case.    The eye was noted to have a physiologic pressure and there was no wound leak noted.   Timolol and Brimonidine drops were applied to the eye.  The patient was taken to the recovery room in stable condition having had no complications of anesthesia or surgery.  Leah Espinoza 08/10/2017, 10:27 AM

## 2017-08-10 NOTE — Transfer of Care (Signed)
Immediate Anesthesia Transfer of Care Note  Patient: Leah Espinoza  Procedure(s) Performed: CATARACT EXTRACTION PHACO AND INTRAOCULAR LENS PLACEMENT (IOC) LEFT SYMFONY TORIC LENS (Left )  Patient Location: PACU  Anesthesia Type: No value filed.  Level of Consciousness: awake, alert  and patient cooperative  Airway and Oxygen Therapy: Patient Spontanous Breathing and Patient connected to supplemental oxygen  Post-op Assessment: Post-op Vital signs reviewed, Patient's Cardiovascular Status Stable, Respiratory Function Stable, Patent Airway and No signs of Nausea or vomiting  Post-op Vital Signs: Reviewed and stable  Complications: No apparent anesthesia complications

## 2017-08-12 ENCOUNTER — Encounter: Payer: Self-pay | Admitting: Ophthalmology

## 2017-09-12 DIAGNOSIS — R001 Bradycardia, unspecified: Secondary | ICD-10-CM | POA: Insufficient documentation

## 2017-10-20 ENCOUNTER — Other Ambulatory Visit: Payer: Self-pay | Admitting: Internal Medicine

## 2017-11-02 ENCOUNTER — Encounter: Payer: Self-pay | Admitting: Internal Medicine

## 2017-11-02 ENCOUNTER — Ambulatory Visit: Payer: Managed Care, Other (non HMO) | Admitting: Internal Medicine

## 2017-11-02 DIAGNOSIS — E7800 Pure hypercholesterolemia, unspecified: Secondary | ICD-10-CM

## 2017-11-02 DIAGNOSIS — R55 Syncope and collapse: Secondary | ICD-10-CM | POA: Insufficient documentation

## 2017-11-02 DIAGNOSIS — I700 Atherosclerosis of aorta: Secondary | ICD-10-CM

## 2017-11-02 DIAGNOSIS — I252 Old myocardial infarction: Secondary | ICD-10-CM | POA: Diagnosis not present

## 2017-11-02 DIAGNOSIS — R001 Bradycardia, unspecified: Secondary | ICD-10-CM | POA: Diagnosis not present

## 2017-11-02 NOTE — Progress Notes (Signed)
Patient ID: Leah Espinoza, female   DOB: 1956-08-12, 62 y.o.   MRN: [id number]   Subjective:    Patient ID: Leah Espinoza, female    DOB: [birth year], 62 y.o.   MRN: [id number]  HPI  Patient here for a scheduled follow up.  History of anterior STEMI, DES proximal LAD.  Followed by Dr Saralyn Pilar.  ETT myoview 09/22/16 without significant ischemia.  S/p cataract surgery.  Found to be bradycardic.  Heart rate noted to be in 30-140s per pt.  Recently evaluated by Dr Saralyn Pilar.  No changes made.  Recommended f/u in 4 months.  She reports she had episode recently while in shower of feeling like she was going to pass out.  Went to lie down.  Felt better the rest of the day.  Also reports some jaw pain.  States has only occurred on two occasions.  Occurs while sitting.  No chest pain.  No sob.  No acid reflux.  No abdominal pain.  Bowels moving.  Discussed further cardiac evaluation.  Agreed to EKG today.     Past Medical History:  Diagnosis Date  . Family history of adverse reaction to anesthesia    Sister - PONV  . History of chicken pox   . History of fainting   . Hyperlipidemia   . Hypertension   . MI (myocardial infarction) ([place])   . Motion sickness    circular motion  . Vertigo    no episodes for several months   Past Surgical History:  Procedure Laterality Date  . CARDIAC CATHETERIZATION N/A 12/23/2015   Procedure: Left Heart Cath and Coronary Angiography;  Surgeon: Isaias Cowman, MD;  Location: [facility];  Service: Cardiovascular;  Laterality: N/A;  . CARDIAC CATHETERIZATION N/A 12/23/2015   Procedure: Coronary Stent Intervention;  Surgeon: Isaias Cowman, MD;  Location: [facility];  Service: Cardiovascular;  Laterality: N/A;  . CATARACT EXTRACTION W/PHACO Right 07/27/2017   Procedure: CATARACT EXTRACTION PHACO AND INTRAOCULAR LENS PLACEMENT ([place]) RIGHT SYMFONY LENS;  Surgeon: Leandrew Koyanagi, MD;  Location: [facility];  Service: Ophthalmology;   Laterality: Right;  . CATARACT EXTRACTION W/PHACO Left 08/10/2017   Procedure: CATARACT EXTRACTION PHACO AND INTRAOCULAR LENS PLACEMENT ([place]) LEFT SYMFONY TORIC LENS;  Surgeon: Leandrew Koyanagi, MD;  Location: [facility];  Service: Ophthalmology;  Laterality: Left;  . COLONOSCOPY     Family History  Problem Relation Age of Onset  . Hyperlipidemia Mother   . Heart disease Mother   . Cancer Father        lung  . Sudden death Brother   . Arthritis Maternal Grandmother    Social History   Socioeconomic History  . Marital status: Married    Spouse name: None  . Number of children: None  . Years of education: None  . Highest education level: None  Social Needs  . Financial resource strain: None  . Food insecurity - worry: None  . Food insecurity - inability: None  . Transportation needs - medical: None  . Transportation needs - non-medical: None  Occupational History  . None  Tobacco Use  . Smoking status: Former Research scientist (life sciences)  . Smokeless tobacco: Never Used  . Tobacco comment: smoked for approx 1 yr around age 56  Substance and Sexual Activity  . Alcohol use: No    Alcohol/week: 0.0 oz  . Drug use: No  . Sexual activity: None  Other Topics Concern  . None  Social History Narrative  . None  Outpatient Encounter Medications as of 11/02/2017  Medication Sig  . aspirin EC 81 MG EC tablet Take 1 tablet (81 mg total) by mouth daily.  . metoprolol tartrate (LOPRESSOR) 25 MG tablet Take 0.5 tablets (12.5 mg total) by mouth 2 (two) times daily.  . ticagrelor (BRILINTA) 90 MG TABS tablet Take 1 tablet (90 mg total) by mouth 2 (two) times daily.  . [DISCONTINUED] rosuvastatin (CRESTOR) 20 MG tablet TAKE 1 TABLET BY MOUTH DAILY IN THE EVENING   No facility-administered encounter medications on file as of 11/02/2017.     Review of Systems  Constitutional: Negative for appetite change and unexpected weight change.  HENT: Negative for congestion and sinus pressure.     Respiratory: Negative for cough, chest tightness and shortness of breath.   Cardiovascular: Negative for chest pain, palpitations and leg swelling.  Gastrointestinal: Negative for abdominal pain, diarrhea, nausea and vomiting.  Genitourinary: Negative for difficulty urinating and dysuria.  Musculoskeletal: Negative for joint swelling and myalgias.  Skin: Negative for color change and rash.  Neurological: Negative for dizziness, light-headedness and headaches.  Psychiatric/Behavioral: Negative for agitation and dysphoric mood.       Objective:    Physical Exam  Constitutional: She appears well-developed and well-nourished. No distress.  HENT:  Nose: Nose normal.  Mouth/Throat: Oropharynx is clear and moist.  Neck: Neck supple. No thyromegaly present.  Cardiovascular: Regular rhythm.  Pulse rate - 50s.    Pulmonary/Chest: Breath sounds normal. No respiratory distress. She has no wheezes.  Abdominal: Soft. Bowel sounds are normal. There is no tenderness.  Musculoskeletal: She exhibits no edema or tenderness.  Lymphadenopathy:    She has no cervical adenopathy.  Skin: No rash noted. No erythema.  Psychiatric: She has a normal mood and affect. Her behavior is normal.    BP 130/74 (BP Location: Left Arm, Patient Position: Sitting, Cuff Size: Normal)   Pulse (!) 56   Temp 98.4 F (36.9 C) (Oral)   Resp 18   Wt 158 lb 6.4 oz (71.8 kg)   SpO2 97%   BMI 29.93 kg/m  Wt Readings from Last 3 Encounters:  11/02/17 158 lb 6.4 oz (71.8 kg)  08/10/17 154 lb (69.9 kg)  07/27/17 153 lb 12.8 oz (69.8 kg)     Lab Results  Component Value Date   WBC 6.0 02/27/2016   HGB 12.7 02/27/2016   HCT 37.5 02/27/2016   PLT 240 02/27/2016   GLUCOSE 100 (H) 11/02/2017   CHOL 181 11/02/2017   TRIG 61 11/02/2017   HDL 65 11/02/2017   LDLCALC 104 (H) 11/02/2017   ALT 21 11/02/2017   AST 18 11/02/2017   NA 142 11/02/2017   K 4.9 11/02/2017   CL 106 11/02/2017   CREATININE 1.09 (H)  11/02/2017   BUN 18 11/02/2017   CO2 23 11/02/2017   TSH 1.990 11/02/2017   INR 1.06 12/24/2015       Assessment & Plan:   Problem List Items Addressed This Visit    Aortic atherosclerosis ([place])    On crestor      Bradycardia    Recent evaluation by cardiology for bradycardia noted with cataract surgery.  EKG today SB with no acute ischemic changes.  Had the near syncopal episode as outlined.  A couple of episodes of jaw pain as outlined.  No chest pain.  Discussed at length with her today.  Discussed my concern regarding the need for further cardiac evaluation and f/u.  She declines.  Wants to monitor.  Will notify me if she changes her mind.  Will decrease toprol to 12.5mg  q day.  Follow.        Relevant Orders   EKG 12-Lead (Completed)   History of ST elevation myocardial infarction (STEMI)    Being followed by cardiology.  Just evaluated as outlined.  Discussed recent near syncope and jaw pain, bradycardia, etc.  Discussed my desire to refer her back to cardiology for reevaluation.  She declines.  Will decrease toprol as outlined.  Follow.        Hypercholesterolemia    On crestor.  Low cholesterol diet and exercise.  Follow lipid panel and liver function tests.        Near syncope    Had the near syncopal episode as outlined.  Has noticed bradycardia recently.  EKG - SR/SB with no acute ischemic changes.  Discussed the need for further cardiac evaluation. She declines.  Decrease toprol to 12.5mg  q day.  Follow.        Relevant Orders   EKG 12-Lead (Completed)       Einar Pheasant, MD

## 2017-11-02 NOTE — Patient Instructions (Signed)
Decrease the metoprolol to one time per day.

## 2017-11-03 LAB — LIPID PANEL
CHOLESTEROL TOTAL: 181 mg/dL (ref 100–199)
Chol/HDL Ratio: 2.8 ratio (ref 0.0–4.4)
HDL: 65 mg/dL (ref >39)
LDL CALC: 104 mg/dL — AB (ref 0–99)
Triglycerides: 61 mg/dL (ref 0–149)
VLDL Cholesterol Cal: 12 mg/dL (ref 5–40)

## 2017-11-03 LAB — BASIC METABOLIC PANEL
BUN / CREAT RATIO: 17 (ref 12–28)
BUN: 18 mg/dL (ref 8–27)
CO2: 23 mmol/L (ref 20–29)
Calcium: 9.2 mg/dL (ref 8.7–10.3)
Chloride: 106 mmol/L (ref 96–106)
Creatinine, Ser: 1.09 mg/dL — ABNORMAL HIGH (ref 0.57–1.00)
GFR calc non Af Amer: 55 mL/min/{1.73_m2} — ABNORMAL LOW (ref >59)
GFR, EST AFRICAN AMERICAN: 63 mL/min/{1.73_m2} (ref >59)
GLUCOSE: 100 mg/dL — AB (ref 65–99)
Potassium: 4.9 mmol/L (ref 3.5–5.2)
SODIUM: 142 mmol/L (ref 134–144)

## 2017-11-03 LAB — HEPATIC FUNCTION PANEL
ALK PHOS: 83 IU/L (ref 39–117)
ALT: 21 IU/L (ref 0–32)
AST: 18 IU/L (ref 0–40)
Albumin: 4.2 g/dL (ref 3.6–4.8)
BILIRUBIN, DIRECT: 0.1 mg/dL (ref 0.00–0.40)
Bilirubin Total: 0.3 mg/dL (ref 0.0–1.2)
Total Protein: 7.3 g/dL (ref 6.0–8.5)

## 2017-11-03 LAB — TSH: TSH: 1.99 u[IU]/mL (ref 0.450–4.500)

## 2017-11-04 ENCOUNTER — Telehealth: Payer: Self-pay | Admitting: *Deleted

## 2017-11-04 MED ORDER — ROSUVASTATIN CALCIUM 40 MG PO TABS: 20.0000 mg | ORAL_TABLET | Freq: Every evening | ORAL | 1 refills | Status: DC

## 2017-11-04 NOTE — Telephone Encounter (Signed)
-----   Message from Einar Pheasant, MD sent at 11/03/2017  6:17 AM EST ----- Please call and notify pt that her cholesterol levels are relatively stable from recent checks.  She is currently on crestor 20mg  q day.  Given her cardiac history, would like to increase the dose.  If she is agreeable, increase crestor to 40mg  q day.  Will need new rx.  Kidney function tests overall stable.  Continue to stay hydrated.  Thyroid test and liver function tests are wnl.

## 2017-11-04 NOTE — Telephone Encounter (Signed)
Patient notified of results and new dose of crestor sent to pharmacy.

## 2017-11-05 ENCOUNTER — Encounter: Payer: Self-pay | Admitting: Internal Medicine

## 2017-11-05 NOTE — Assessment & Plan Note (Signed)
Had the near syncopal episode as outlined.  Has noticed bradycardia recently.  EKG - SR/SB with no acute ischemic changes.  Discussed the need for further cardiac evaluation. She declines.  Decrease toprol to 12.5mg  q day.  Follow.

## 2017-11-05 NOTE — Assessment & Plan Note (Addendum)
Recent evaluation by cardiology for bradycardia noted with cataract surgery.  EKG today SB with no acute ischemic changes.  Had the near syncopal episode as outlined.  A couple of episodes of jaw pain as outlined.  No chest pain.  Discussed at length with her today.  Discussed my concern regarding the need for further cardiac evaluation and f/u.  She declines.  Wants to monitor.  Will notify me if she changes her mind.  Will decrease toprol to 12.5mg  q day.  Follow.

## 2017-11-05 NOTE — Assessment & Plan Note (Signed)
On crestor.   

## 2017-11-05 NOTE — Assessment & Plan Note (Signed)
Being followed by cardiology.  Just evaluated as outlined.  Discussed recent near syncope and jaw pain, bradycardia, etc.  Discussed my desire to refer her back to cardiology for reevaluation.  She declines.  Will decrease toprol as outlined.  Follow.

## 2017-11-05 NOTE — Assessment & Plan Note (Signed)
On crestor.  Low cholesterol diet and exercise.  Follow lipid panel and liver function tests.   

## 2017-11-28 ENCOUNTER — Other Ambulatory Visit: Payer: Self-pay | Admitting: Internal Medicine

## 2017-12-09 ENCOUNTER — Telehealth: Payer: Self-pay

## 2017-12-09 MED ORDER — ROSUVASTATIN CALCIUM 40 MG PO TABS: 40.0000 mg | ORAL_TABLET | Freq: Every day | ORAL | 3 refills | Status: DC

## 2017-12-09 NOTE — Telephone Encounter (Signed)
Per note on 11-04-17 Dr. Nicki Reaper stated if patient is agreeable she can start on crestor 40mg  daily and to send new rx.  Per CRM below patient is requesting new RX. New RX for Crestor 40mg  has been sent to  [facility].  Copied from [place] [phone]. Topic: General - Other >> Dec 09, 2017 11:26 AM Carolyn Stare wrote:  [facility] cal lto say pt said she is suppose to be  taking 40 mg the patient said Dr Nicki Reaper increased  . If the increased is 40mg  phamacy  will need a new RX   rosuvastatin (CRESTOR) 40 MG tablet

## 2017-12-09 NOTE — Telephone Encounter (Signed)
error 

## 2018-02-03 ENCOUNTER — Ambulatory Visit (INDEPENDENT_AMBULATORY_CARE_PROVIDER_SITE_OTHER): Payer: Managed Care, Other (non HMO) | Admitting: Internal Medicine

## 2018-02-03 ENCOUNTER — Encounter: Payer: Self-pay | Admitting: Internal Medicine

## 2018-02-03 VITALS — BP 122/70 | HR 52 | Temp 98.3°F | Resp 18 | Wt 157.0 lb

## 2018-02-03 DIAGNOSIS — R001 Bradycardia, unspecified: Secondary | ICD-10-CM | POA: Diagnosis not present

## 2018-02-03 DIAGNOSIS — I252 Old myocardial infarction: Secondary | ICD-10-CM

## 2018-02-03 DIAGNOSIS — F439 Reaction to severe stress, unspecified: Secondary | ICD-10-CM

## 2018-02-03 DIAGNOSIS — E7800 Pure hypercholesterolemia, unspecified: Secondary | ICD-10-CM

## 2018-02-03 DIAGNOSIS — I700 Atherosclerosis of aorta: Secondary | ICD-10-CM | POA: Diagnosis not present

## 2018-02-03 LAB — CBC WITH DIFFERENTIAL/PLATELET
BASOS ABS: 0 10*3/uL (ref 0.0–0.1)
Basophils Relative: 0.7 % (ref 0.0–3.0)
Eosinophils Absolute: 0.1 10*3/uL (ref 0.0–0.7)
Eosinophils Relative: 2.3 % (ref 0.0–5.0)
HCT: 38.2 % (ref 36.0–46.0)
Hemoglobin: 12.7 g/dL (ref 12.0–15.0)
LYMPHS ABS: 2.3 10*3/uL (ref 0.7–4.0)
Lymphocytes Relative: 35.5 % (ref 12.0–46.0)
MCHC: 33.3 g/dL (ref 30.0–36.0)
MCV: 94.7 fl (ref 78.0–100.0)
MONOS PCT: 7.8 % (ref 3.0–12.0)
Monocytes Absolute: 0.5 10*3/uL (ref 0.1–1.0)
NEUTROS ABS: 3.5 10*3/uL (ref 1.4–7.7)
NEUTROS PCT: 53.7 % (ref 43.0–77.0)
PLATELETS: 261 10*3/uL (ref 150.0–400.0)
RBC: 4.04 Mil/uL (ref 3.87–5.11)
RDW: 14.3 % (ref 11.5–15.5)
WBC: 6.5 10*3/uL (ref 4.0–10.5)

## 2018-02-03 LAB — BASIC METABOLIC PANEL
BUN: 18 mg/dL (ref 6–23)
CALCIUM: 9.7 mg/dL (ref 8.4–10.5)
CHLORIDE: 104 meq/L (ref 96–112)
CO2: 29 mEq/L (ref 19–32)
CREATININE: 1.24 mg/dL — AB (ref 0.40–1.20)
GFR: 46.44 mL/min — AB (ref >60.00)
Glucose, Bld: 108 mg/dL — ABNORMAL HIGH (ref 70–99)
Potassium: 4.6 mEq/L (ref 3.5–5.1)
Sodium: 140 mEq/L (ref 135–145)

## 2018-02-03 LAB — HEPATIC FUNCTION PANEL
ALT: 18 U/L (ref 0–35)
AST: 18 U/L (ref 0–37)
Albumin: 4 g/dL (ref 3.5–5.2)
Alkaline Phosphatase: 70 U/L (ref 39–117)
BILIRUBIN TOTAL: 0.5 mg/dL (ref 0.2–1.2)
Bilirubin, Direct: 0.1 mg/dL (ref 0.0–0.3)
Total Protein: 7.8 g/dL (ref 6.0–8.3)

## 2018-02-03 LAB — LIPID PANEL
CHOL/HDL RATIO: 3
Cholesterol: 165 mg/dL (ref 0–200)
HDL: 57 mg/dL (ref >39.00)
LDL CALC: 93 mg/dL (ref 0–99)
NonHDL: 107.57
TRIGLYCERIDES: 74 mg/dL (ref 0.0–149.0)
VLDL: 14.8 mg/dL (ref 0.0–40.0)

## 2018-02-03 NOTE — Progress Notes (Signed)
Patient ID: Leah Espinoza, female   DOB: 1956-06-17, 62 y.o.   MRN: [id number]   Subjective:    Patient ID: Leah Espinoza, female    DOB: [birth year], 62 y.o.   MRN: [id number]  HPI  Patient here for a scheduled follow up. Followed by cardiology for her known CAD.  Just evaluated 01/09/18.  No changes made.  Felt stable.  She reports she had diarrhea this am.  States her bowels will flare at times.  Does not occur often.  Had several episodes of loose stool today and feels washed out.  No chest pain.  Breathing stable.  She feels she does not feel as good today - related to her loose stools.  No acid reflux.  No abdominal pain.  Discussed her metoprolol dosing.  She is supposed to be taking 1/2 of 25mg  tablet.  Took whole tablet yesterday.  Heart rate a little lower today.  Has been doing better per her report.  No headache.  No dizziness.     Past Medical History:  Diagnosis Date  . Family history of adverse reaction to anesthesia    Sister - PONV  . History of chicken pox   . History of fainting   . Hyperlipidemia   . Hypertension   . MI (myocardial infarction) ([place])   . Motion sickness    circular motion  . Vertigo    no episodes for several months   Past Surgical History:  Procedure Laterality Date  . CARDIAC CATHETERIZATION N/A 12/23/2015   Procedure: Left Heart Cath and Coronary Angiography;  Surgeon: Isaias Cowman, MD;  Location: [place];  Service: Cardiovascular;  Laterality: N/A;  . CARDIAC CATHETERIZATION N/A 12/23/2015   Procedure: Coronary Stent Intervention;  Surgeon: Isaias Cowman, MD;  Location: [facility];  Service: Cardiovascular;  Laterality: N/A;  . CATARACT EXTRACTION W/PHACO Right 07/27/2017   Procedure: CATARACT EXTRACTION PHACO AND INTRAOCULAR LENS PLACEMENT ([place]) RIGHT SYMFONY LENS;  Surgeon: Leandrew Koyanagi, MD;  Location: [facility];  Service: Ophthalmology;  Laterality: Right;  . CATARACT EXTRACTION W/PHACO Left  08/10/2017   Procedure: CATARACT EXTRACTION PHACO AND INTRAOCULAR LENS PLACEMENT ([place]) LEFT SYMFONY TORIC LENS;  Surgeon: Leandrew Koyanagi, MD;  Location: [facility];  Service: Ophthalmology;  Laterality: Left;  . COLONOSCOPY     Family History  Problem Relation Age of Onset  . Hyperlipidemia Mother   . Heart disease Mother   . Cancer Father        lung  . Sudden death Brother   . Arthritis Maternal Grandmother    Social History   Socioeconomic History  . Marital status: Married    Spouse name: Not on file  . Number of children: Not on file  . Years of education: Not on file  . Highest education level: Not on file  Occupational History  . Not on file  Social Needs  . Financial resource strain: Not on file  . Food insecurity:    Worry: Not on file    Inability: Not on file  . Transportation needs:    Medical: Not on file    Non-medical: Not on file  Tobacco Use  . Smoking status: Former Research scientist (life sciences)  . Smokeless tobacco: Never Used  . Tobacco comment: smoked for approx 1 yr around age 25  Substance and Sexual Activity  . Alcohol use: No    Alcohol/week: 0.0 oz  . Drug use: No  . Sexual activity: Not on file  Lifestyle  .  Physical activity:    Days per week: Not on file    Minutes per session: Not on file  . Stress: Not on file  Relationships  . Social connections:    Talks on phone: Not on file    Gets together: Not on file    Attends religious service: Not on file    Active member of club or organization: Not on file    Attends meetings of clubs or organizations: Not on file    Relationship status: Not on file  Other Topics Concern  . Not on file  Social History Narrative  . Not on file    Outpatient Encounter Medications as of 02/03/2018  Medication Sig  . aspirin EC 81 MG EC tablet Take 1 tablet (81 mg total) by mouth daily.  . metoprolol tartrate (LOPRESSOR) 25 MG tablet TAKE ONE-HALF TABLET BY MOUTH TWICE DAILY  . rosuvastatin (CRESTOR) 40 MG  tablet Take 1 tablet (40 mg total) by mouth daily.  . ticagrelor (BRILINTA) 90 MG TABS tablet Take 1 tablet (90 mg total) by mouth 2 (two) times daily.  . [DISCONTINUED] rosuvastatin (CRESTOR) 40 MG tablet Take 0.5 tablets (20 mg total) by mouth every evening.   No facility-administered encounter medications on file as of 02/03/2018.     Review of Systems  Constitutional: Positive for fatigue. Negative for appetite change.  HENT: Negative for congestion and sinus pressure.   Respiratory: Negative for cough and chest tightness.        Breathing overall stable.   Cardiovascular: Negative for chest pain, palpitations and leg swelling.  Gastrointestinal: Positive for diarrhea. Negative for abdominal pain and nausea.  Genitourinary: Negative for difficulty urinating and dysuria.  Musculoskeletal: Negative for joint swelling and myalgias.  Skin: Negative for color change and rash.  Neurological: Negative for dizziness, light-headedness and headaches.  Psychiatric/Behavioral: Negative for agitation and dysphoric mood.       Objective:    Physical Exam  Constitutional: She appears well-developed and well-nourished. No distress.  HENT:  Nose: Nose normal.  Mouth/Throat: Oropharynx is clear and moist.  Neck: Neck supple. No thyromegaly present.  Cardiovascular: Regular rhythm.  Ventricular rate - 50  Pulmonary/Chest: Breath sounds normal. No respiratory distress. She has no wheezes.  Abdominal: Soft. Bowel sounds are normal. There is no tenderness.  Musculoskeletal: She exhibits no edema or tenderness.  Lymphadenopathy:    She has no cervical adenopathy.  Skin: No rash noted. No erythema.  Psychiatric: She has a normal mood and affect. Her behavior is normal.    BP 122/70 (BP Location: Left Arm, Patient Position: Sitting, Cuff Size: Normal)   Pulse (!) 52   Temp 98.3 F (36.8 C) (Oral)   Resp 18   Wt 157 lb (71.2 kg)   SpO2 96%   BMI 29.66 kg/m  Wt Readings from Last 3  Encounters:  02/03/18 157 lb (71.2 kg)  11/02/17 158 lb 6.4 oz (71.8 kg)  08/10/17 154 lb (69.9 kg)     Lab Results  Component Value Date   WBC 6.5 02/03/2018   HGB 12.7 02/03/2018   HCT 38.2 02/03/2018   PLT 261.0 02/03/2018   GLUCOSE 108 (H) 02/03/2018   CHOL 165 02/03/2018   TRIG 74.0 02/03/2018   HDL 57.00 02/03/2018   LDLCALC 93 02/03/2018   ALT 18 02/03/2018   AST 18 02/03/2018   NA 140 02/03/2018   K 4.6 02/03/2018   CL 104 02/03/2018   CREATININE 1.24 (H) 02/03/2018   BUN  18 02/03/2018   CO2 29 02/03/2018   TSH 1.990 11/02/2017   INR 1.06 12/24/2015       Assessment & Plan:   Problem List Items Addressed This Visit    Aortic atherosclerosis ([place])    On crestor.       Bradycardia    Recent decreased metoprolol to 1/2 tablet per day.  She took whole tablet yesterday.  Previously was on 1/2 tablet and states heart rate has been doing better.  Start on 12. 5mg  metoprolol daily as we discussed.  Follow.        History of ST elevation myocardial infarction (STEMI)    Followed by cardiology.  She feels things are stable.  Recently saw Dr Saralyn Pilar.  Felt things were stable.  Continue risk factor modification.  Follow.        Hypercholesterolemia - Primary    On crestor.  Low cholesterol diet and exercise.  Follow lipid panel and liver function tests.        Relevant Orders   Hepatic function panel (Completed)   Lipid panel (Completed)   CBC with Differential/Platelet (Completed)   Basic metabolic panel (Completed)   Stress    Overall she feels she is handling things relatively well.  Follow.  Does not feel she needs any further intervention.            Einar Pheasant, MD

## 2018-02-06 ENCOUNTER — Encounter: Payer: Self-pay | Admitting: Internal Medicine

## 2018-02-06 NOTE — Assessment & Plan Note (Signed)
On crestor.   

## 2018-02-06 NOTE — Assessment & Plan Note (Signed)
Recent decreased metoprolol to 1/2 tablet per day.  She took whole tablet yesterday.  Previously was on 1/2 tablet and states heart rate has been doing better.  Start on 12. 5mg  metoprolol daily as we discussed.  Follow.

## 2018-02-06 NOTE — Assessment & Plan Note (Signed)
Followed by cardiology.  She feels things are stable.  Recently saw Dr Saralyn Pilar.  Felt things were stable.  Continue risk factor modification.  Follow.

## 2018-02-06 NOTE — Assessment & Plan Note (Signed)
On crestor.  Low cholesterol diet and exercise.  Follow lipid panel and liver function tests.   

## 2018-02-06 NOTE — Assessment & Plan Note (Signed)
Overall she feels she is handling things relatively well.  Follow.  Does not feel she needs any further intervention.

## 2018-02-07 ENCOUNTER — Other Ambulatory Visit: Payer: Self-pay | Admitting: Internal Medicine

## 2018-02-07 DIAGNOSIS — R7989 Other specified abnormal findings of blood chemistry: Secondary | ICD-10-CM

## 2018-02-07 NOTE — Progress Notes (Signed)
Order placed for f/u lab.   

## 2018-02-20 ENCOUNTER — Other Ambulatory Visit (INDEPENDENT_AMBULATORY_CARE_PROVIDER_SITE_OTHER): Payer: Managed Care, Other (non HMO)

## 2018-02-20 DIAGNOSIS — R7989 Other specified abnormal findings of blood chemistry: Secondary | ICD-10-CM | POA: Diagnosis not present

## 2018-02-20 LAB — BASIC METABOLIC PANEL
BUN: 19 mg/dL (ref 6–23)
CHLORIDE: 103 meq/L (ref 96–112)
CO2: 27 mEq/L (ref 19–32)
CREATININE: 1.38 mg/dL — AB (ref 0.40–1.20)
Calcium: 9.4 mg/dL (ref 8.4–10.5)
GFR: 41.04 mL/min — ABNORMAL LOW (ref >60.00)
GLUCOSE: 107 mg/dL — AB (ref 70–99)
POTASSIUM: 4.2 meq/L (ref 3.5–5.1)
Sodium: 137 mEq/L (ref 135–145)

## 2018-02-21 ENCOUNTER — Telehealth: Payer: Self-pay | Admitting: *Deleted

## 2018-02-21 DIAGNOSIS — R7989 Other specified abnormal findings of blood chemistry: Secondary | ICD-10-CM

## 2018-02-21 NOTE — Telephone Encounter (Signed)
-----   Message from Einar Pheasant, MD sent at 02/21/2018  3:50 AM EDT ----- Notify pt that her kidney function has decreased a little more when compared to the previous check.  Confirm she is not taking antiinflammatories (i.e., advil, alleve, ibuprofen).   I would like to schedule a renal ultrasound to better evaluate kidneys.  Also see if she can drop off a urine - to check urinalysis.  Also, given change, I would like to refer her to nephrology for further evaluation.  If agreeable to renal ultrasound and to referral, let me know and I will place the order.

## 2018-02-21 NOTE — Telephone Encounter (Signed)
See lab note order placed for UA, lab scheduled. Patient is willing to have [place] and to see Nephrology prefers to stay in [place] area, and [place] patient will be on Vacation week of 04/03/18.

## 2018-02-22 ENCOUNTER — Other Ambulatory Visit: Payer: Self-pay | Admitting: Internal Medicine

## 2018-02-22 DIAGNOSIS — R7989 Other specified abnormal findings of blood chemistry: Secondary | ICD-10-CM

## 2018-02-22 DIAGNOSIS — N289 Disorder of kidney and ureter, unspecified: Secondary | ICD-10-CM

## 2018-02-22 NOTE — Progress Notes (Signed)
Order placed for referral to nephrology and for renal ultrasound.  

## 2018-02-23 ENCOUNTER — Other Ambulatory Visit (INDEPENDENT_AMBULATORY_CARE_PROVIDER_SITE_OTHER): Payer: Managed Care, Other (non HMO)

## 2018-02-23 DIAGNOSIS — R7989 Other specified abnormal findings of blood chemistry: Secondary | ICD-10-CM | POA: Diagnosis not present

## 2018-02-23 LAB — URINALYSIS, ROUTINE W REFLEX MICROSCOPIC
Bilirubin Urine: NEGATIVE
Ketones, ur: NEGATIVE
NITRITE: NEGATIVE
SPECIFIC GRAVITY, URINE: 1.015 (ref 1.000–1.030)
TOTAL PROTEIN, URINE-UPE24: 30 — AB
URINE GLUCOSE: NEGATIVE
UROBILINOGEN UA: 0.2 (ref 0.0–1.0)
pH: 5.5 (ref 5.0–8.0)

## 2018-03-01 ENCOUNTER — Ambulatory Visit
Admission: RE | Admit: 2018-03-01 | Discharge: 2018-03-01 | Disposition: A | Discharge status: Home / Self Care | Payer: Managed Care, Other (non HMO) | Source: Ambulatory Visit | Attending: Internal Medicine | Admitting: Internal Medicine

## 2018-03-01 DIAGNOSIS — R7989 Other specified abnormal findings of blood chemistry: Secondary | ICD-10-CM | POA: Insufficient documentation

## 2018-03-01 DIAGNOSIS — N289 Disorder of kidney and ureter, unspecified: Secondary | ICD-10-CM | POA: Diagnosis not present

## 2018-04-17 DIAGNOSIS — R0609 Other forms of dyspnea: Secondary | ICD-10-CM | POA: Insufficient documentation

## 2018-05-08 ENCOUNTER — Ambulatory Visit (INDEPENDENT_AMBULATORY_CARE_PROVIDER_SITE_OTHER): Payer: Managed Care, Other (non HMO)

## 2018-05-08 ENCOUNTER — Encounter: Payer: Self-pay | Admitting: Internal Medicine

## 2018-05-08 ENCOUNTER — Ambulatory Visit (INDEPENDENT_AMBULATORY_CARE_PROVIDER_SITE_OTHER): Payer: Managed Care, Other (non HMO) | Admitting: Internal Medicine

## 2018-05-08 VITALS — BP 130/80 | HR 89 | Temp 98.3°F | Resp 18 | Ht 62.0 in | Wt 156.8 lb

## 2018-05-08 DIAGNOSIS — Z0000 Encounter for general adult medical examination without abnormal findings: Secondary | ICD-10-CM

## 2018-05-08 DIAGNOSIS — I700 Atherosclerosis of aorta: Secondary | ICD-10-CM

## 2018-05-08 DIAGNOSIS — Z1231 Encounter for screening mammogram for malignant neoplasm of breast: Secondary | ICD-10-CM

## 2018-05-08 DIAGNOSIS — Z1239 Encounter for other screening for malignant neoplasm of breast: Secondary | ICD-10-CM

## 2018-05-08 DIAGNOSIS — R05 Cough: Secondary | ICD-10-CM | POA: Diagnosis not present

## 2018-05-08 DIAGNOSIS — E7800 Pure hypercholesterolemia, unspecified: Secondary | ICD-10-CM

## 2018-05-08 DIAGNOSIS — R059 Cough, unspecified: Secondary | ICD-10-CM

## 2018-05-08 DIAGNOSIS — I252 Old myocardial infarction: Secondary | ICD-10-CM | POA: Diagnosis not present

## 2018-05-08 DIAGNOSIS — R7989 Other specified abnormal findings of blood chemistry: Secondary | ICD-10-CM | POA: Diagnosis not present

## 2018-05-08 MED ORDER — DOXYCYCLINE HYCLATE 100 MG PO TABS: 100.0000 mg | ORAL_TABLET | Freq: Two times a day (BID) | ORAL | 0 refills | Status: DC

## 2018-05-08 NOTE — Assessment & Plan Note (Addendum)
Physical today 05/08/18.  PAP 8//13/18.  Mammogram ordered.  Colonoscopy 12/2013.  Per pt, f/u recommended in 10 years.  Mother with polyps.

## 2018-05-08 NOTE — Progress Notes (Signed)
Patient ID: Leah Espinoza, female   DOB: [birth year], 62 y.o.   MRN: [id number]   Subjective:    Patient ID: Leah Espinoza, female    DOB: December 03, 1955, 62 y.o.   MRN: [id number]  HPI  Patient here for her physical exam.  She reports she is doing relatively well.  Does report over the last 10 days noticing increased cough and drainage.  Some chest congestion.  Persistent symptoms.  Has not felt well.  Previous fever, but this has resolved.  Some occasional acid reflux.  Eating.  No nausea or vomiting.  No abdominal pain.  Bowels moving. No urine change.  Taking Tylenol Cold and Sinus.  No sob.  No wheezing.  Saw nephrology.  Recommended to stay hydrated.  Labs checked.  Has f/u planned.  Saw cardiology 04/17/18.  Recommended continuing current medication regimen.  Previous holter unremarkable.  ETT myoview - no change.  Felt stable.  No changes made.  Recommended f/u in 3 months.     Past Medical History:  Diagnosis Date  . Family history of adverse reaction to anesthesia    Sister - PONV  . History of chicken pox   . History of fainting   . Hyperlipidemia   . Hypertension   . MI (myocardial infarction) ([place])   . Motion sickness    circular motion  . Vertigo    no episodes for several months   Past Surgical History:  Procedure Laterality Date  . CARDIAC CATHETERIZATION N/A 12/23/2015   Procedure: Left Heart Cath and Coronary Angiography;  Surgeon: Isaias Cowman, MD;  Location: [facility];  Service: Cardiovascular;  Laterality: N/A;  . CARDIAC CATHETERIZATION N/A 12/23/2015   Procedure: Coronary Stent Intervention;  Surgeon: Isaias Cowman, MD;  Location: [facility];  Service: Cardiovascular;  Laterality: N/A;  . CATARACT EXTRACTION W/PHACO Right 07/27/2017   Procedure: CATARACT EXTRACTION PHACO AND INTRAOCULAR LENS PLACEMENT ([place]) RIGHT SYMFONY LENS;  Surgeon: Leandrew Koyanagi, MD;  Location: [facility];  Service: Ophthalmology;  Laterality: Right;    . CATARACT EXTRACTION W/PHACO Left 08/10/2017   Procedure: CATARACT EXTRACTION PHACO AND INTRAOCULAR LENS PLACEMENT ([place]) LEFT SYMFONY TORIC LENS;  Surgeon: Leandrew Koyanagi, MD;  Location: [place];  Service: Ophthalmology;  Laterality: Left;  . COLONOSCOPY     Family History  Problem Relation Age of Onset  . Hyperlipidemia Mother   . Heart disease Mother   . Cancer Father        lung  . Sudden death Brother   . Arthritis Maternal Grandmother    Social History   Socioeconomic History  . Marital status: Married    Spouse name: Not on file  . Number of children: Not on file  . Years of education: Not on file  . Highest education level: Not on file  Occupational History  . Not on file  Social Needs  . Financial resource strain: Not on file  . Food insecurity:    Worry: Not on file    Inability: Not on file  . Transportation needs:    Medical: Not on file    Non-medical: Not on file  Tobacco Use  . Smoking status: Former Research scientist (life sciences)  . Smokeless tobacco: Never Used  . Tobacco comment: smoked for approx 1 yr around age 64  Substance and Sexual Activity  . Alcohol use: No    Alcohol/week: 0.0 standard drinks  . Drug use: No  . Sexual activity: Not on file  Lifestyle  .  Physical activity:    Days per week: Not on file    Minutes per session: Not on file  . Stress: Not on file  Relationships  . Social connections:    Talks on phone: Not on file    Gets together: Not on file    Attends religious service: Not on file    Active member of club or organization: Not on file    Attends meetings of clubs or organizations: Not on file    Relationship status: Not on file  Other Topics Concern  . Not on file  Social History Narrative  . Not on file    Outpatient Encounter Medications as of 05/08/2018  Medication Sig  . aspirin EC 81 MG EC tablet Take 1 tablet (81 mg total) by mouth daily.  Marland Kitchen doxycycline (VIBRA-TABS) 100 MG tablet Take 1 tablet (100 mg total) by  mouth 2 (two) times daily.  . metoprolol tartrate (LOPRESSOR) 25 MG tablet TAKE ONE-HALF TABLET BY MOUTH TWICE DAILY  . rosuvastatin (CRESTOR) 40 MG tablet Take 1 tablet (40 mg total) by mouth daily.  . ticagrelor (BRILINTA) 90 MG TABS tablet Take 1 tablet (90 mg total) by mouth 2 (two) times daily.   No facility-administered encounter medications on file as of 05/08/2018.     Review of Systems  Constitutional: Negative for appetite change and unexpected weight change.  HENT: Positive for congestion and postnasal drip. Negative for sinus pressure.   Eyes: Negative for pain and visual disturbance.  Respiratory: Positive for cough. Negative for chest tightness and shortness of breath.   Cardiovascular: Negative for chest pain, palpitations and leg swelling.  Gastrointestinal: Negative for abdominal pain, diarrhea, nausea and vomiting.  Genitourinary: Negative for difficulty urinating and dysuria.  Musculoskeletal: Negative for joint swelling and myalgias.  Skin: Negative for color change and rash.  Neurological: Negative for dizziness, light-headedness and headaches.  Hematological: Negative for adenopathy. Does not bruise/bleed easily.  Psychiatric/Behavioral: Negative for agitation and dysphoric mood.       Objective:    Physical Exam  Constitutional: She is oriented to person, place, and time. She appears well-developed and well-nourished. No distress.  HENT:  Mouth/Throat: Oropharynx is clear and moist.  Nares:  Slightly erythematous turbinates.    Eyes: Right eye exhibits no discharge. Left eye exhibits no discharge. No scleral icterus.  Neck: Neck supple. No thyromegaly present.  Cardiovascular: Normal rate and regular rhythm.  Pulmonary/Chest: Breath sounds normal. No accessory muscle usage. No tachypnea. No respiratory distress. She has no decreased breath sounds. She has no wheezes. She has no rhonchi. Right breast exhibits no inverted nipple, no mass, no nipple discharge and  no tenderness (no axillary adenopathy). Left breast exhibits no inverted nipple, no mass, no nipple discharge and no tenderness (no axilarry adenopathy).  Abdominal: Soft. Bowel sounds are normal. There is no tenderness.  Musculoskeletal: She exhibits no edema or tenderness.  Lymphadenopathy:    She has no cervical adenopathy.  Neurological: She is alert and oriented to person, place, and time.  Skin: No rash noted. No erythema.  Psychiatric: She has a normal mood and affect. Her behavior is normal.    BP 130/80 (BP Location: Left Arm, Patient Position: Sitting, Cuff Size: Normal)   Pulse 89   Temp 98.3 F (36.8 C) (Oral)   Resp 18   Ht 5\' 2"  (1.575 m)   Wt 156 lb 12.8 oz (71.1 kg)   SpO2 96%   BMI 28.68 kg/m  Wt Readings from Last  3 Encounters:  05/08/18 156 lb 12.8 oz (71.1 kg)  02/03/18 157 lb (71.2 kg)  11/02/17 158 lb 6.4 oz (71.8 kg)     Lab Results  Component Value Date   WBC 6.5 02/03/2018   HGB 12.7 02/03/2018   HCT 38.2 02/03/2018   PLT 261.0 02/03/2018   GLUCOSE 107 (H) 02/20/2018   CHOL 165 02/03/2018   TRIG 74.0 02/03/2018   HDL 57.00 02/03/2018   LDLCALC 93 02/03/2018   ALT 18 02/03/2018   AST 18 02/03/2018   NA 137 02/20/2018   K 4.2 02/20/2018   CL 103 02/20/2018   CREATININE 1.38 (H) 02/20/2018   BUN 19 02/20/2018   CO2 27 02/20/2018   TSH 1.990 11/02/2017   INR 1.06 12/24/2015    US Renal  Result Date: 03/01/2018 CLINICAL DATA:  Worsening renal function.  Elevated creatinine. EXAM: RENAL / URINARY TRACT ULTRASOUND COMPLETE COMPARISON:  None. FINDINGS: Right Kidney: Length: 9.0 cm. Echogenicity within normal limits. No mass or hydronephrosis visualized. Left Kidney: Length: 9.9 cm. Echogenicity within normal limits. No mass or hydronephrosis visualized. Bladder: Appears normal for degree of bladder distention. IMPRESSION: Normal renal ultrasound.  No explanation for elevated creatinine. Electronically Signed   By: Abigail Miyamoto M.D.   On: 03/01/2018  16:07       Assessment & Plan:   Problem List Items Addressed This Visit    Aortic atherosclerosis ([place])    On crestor.        Cough    With increased cough and congestion with associated drainage.  Treat with nasacort and saline nasal spray as outlined.  Robitussin DM as directed.  rx given for doxycycline.  Check cxr.  Follow.        Relevant Orders   DG Chest 2 View (Completed)   Elevated serum creatinine    Stay hydrated.  Just saw nephrology.  Avoid antiinflammatories.  W/up in progress.  Has f/u scheduled.        Health care maintenance    Physical today 05/08/18.  PAP 8//13/18.  Mammogram ordered.  Colonoscopy 12/2013.  Per pt, f/u recommended in 10 years.  Mother with polyps.        History of ST elevation myocardial infarction (STEMI)    Followed by cardiology.  Last evaluated 04/17/18.  Stable.  No changes made.  Follow.  Currently feels symptoms stable.        Hypercholesterolemia    On crestor.  Low cholesterol diet and exercise.  Follow lipid panel and liver function tests.         Other Visit Diagnoses    Breast cancer screening    -  Primary   Relevant Orders   MM 3D SCREEN BREAST BILATERAL   Routine general medical examination at a health care facility           Einar Pheasant, MD

## 2018-05-08 NOTE — Patient Instructions (Signed)
Saline nasal spray - flush nose at least 2-3x/day  nasacort nasal spray - 2 sprays each nostril one time per day.  Do this in the evening.    Mucinex in the am and robitussin DM in the evening.    If persistent, take the antibiotic as directed.    If you take the antibiotic, then start a probiotic daily.    Examples of probiotics are florastor, culturelle or align.

## 2018-05-09 ENCOUNTER — Other Ambulatory Visit: Payer: Self-pay | Admitting: Internal Medicine

## 2018-05-09 DIAGNOSIS — R9389 Abnormal findings on diagnostic imaging of other specified body structures: Secondary | ICD-10-CM

## 2018-05-09 NOTE — Progress Notes (Signed)
Order placed for f/u cxr.  

## 2018-05-14 ENCOUNTER — Encounter: Payer: Self-pay | Admitting: Internal Medicine

## 2018-05-14 DIAGNOSIS — R05 Cough: Secondary | ICD-10-CM | POA: Insufficient documentation

## 2018-05-14 DIAGNOSIS — R059 Cough, unspecified: Secondary | ICD-10-CM | POA: Insufficient documentation

## 2018-05-14 NOTE — Assessment & Plan Note (Signed)
Stay hydrated.  Just saw nephrology.  Avoid antiinflammatories.  W/up in progress.  Has f/u scheduled.

## 2018-05-14 NOTE — Assessment & Plan Note (Signed)
Followed by cardiology.  Last evaluated 04/17/18.  Stable.  No changes made.  Follow.  Currently feels symptoms stable.

## 2018-05-14 NOTE — Assessment & Plan Note (Signed)
On crestor.   

## 2018-05-14 NOTE — Assessment & Plan Note (Signed)
With increased cough and congestion with associated drainage.  Treat with nasacort and saline nasal spray as outlined.  Robitussin DM as directed.  rx given for doxycycline.  Check cxr.  Follow.

## 2018-05-14 NOTE — Assessment & Plan Note (Signed)
On crestor.  Low cholesterol diet and exercise.  Follow lipid panel and liver function tests.   

## 2018-06-02 ENCOUNTER — Encounter
Admission: RE | Admit: 2018-06-02 | Discharge: 2018-06-02 | Disposition: A | Discharge status: Home / Self Care | Payer: Managed Care, Other (non HMO) | Source: Ambulatory Visit | Attending: Nephrology | Admitting: Nephrology

## 2018-06-02 DIAGNOSIS — Z01812 Encounter for preprocedural laboratory examination: Secondary | ICD-10-CM | POA: Insufficient documentation

## 2018-06-02 LAB — COMPREHENSIVE METABOLIC PANEL
ALT: 30 U/L (ref 0–44)
AST: 32 U/L (ref 15–41)
Albumin: 3.8 g/dL (ref 3.5–5.0)
Alkaline Phosphatase: 58 U/L (ref 38–126)
Anion gap: 12 (ref 5–15)
BUN: 21 mg/dL (ref 8–23)
CALCIUM: 8.9 mg/dL (ref 8.9–10.3)
CO2: 21 mmol/L — AB (ref 22–32)
CREATININE: 1.23 mg/dL — AB (ref 0.44–1.00)
Chloride: 106 mmol/L (ref 98–111)
GFR calc Af Amer: 53 mL/min — ABNORMAL LOW (ref >60)
GFR calc non Af Amer: 46 mL/min — ABNORMAL LOW (ref >60)
GLUCOSE: 98 mg/dL (ref 70–99)
Potassium: 3.9 mmol/L (ref 3.5–5.1)
SODIUM: 139 mmol/L (ref 135–145)
Total Bilirubin: 0.6 mg/dL (ref 0.3–1.2)
Total Protein: 7.6 g/dL (ref 6.5–8.1)

## 2018-06-02 LAB — URINALYSIS, ROUTINE W REFLEX MICROSCOPIC
BILIRUBIN URINE: NEGATIVE
GLUCOSE, UA: NEGATIVE mg/dL
KETONES UR: NEGATIVE mg/dL
Nitrite: NEGATIVE
PROTEIN: 30 mg/dL — AB
Specific Gravity, Urine: 1.008 (ref 1.005–1.030)
pH: 6 (ref 5.0–8.0)

## 2018-06-02 LAB — PROTIME-INR
INR: 0.92
Prothrombin Time: 12.3 seconds (ref 11.4–15.2)

## 2018-06-02 LAB — PROTEIN / CREATININE RATIO, URINE
Creatinine, Urine: 74 mg/dL
Protein Creatinine Ratio: 0.95 mg/mg{Cre} — ABNORMAL HIGH (ref 0.00–0.15)
Total Protein, Urine: 70 mg/dL

## 2018-06-02 LAB — TYPE AND SCREEN
ABO/RH(D): A POS
Antibody Screen: NEGATIVE

## 2018-06-02 LAB — CBC WITH DIFFERENTIAL/PLATELET
BASOS ABS: 0 10*3/uL (ref 0–0.1)
Basophils Relative: 1 %
EOS ABS: 0.1 10*3/uL (ref 0–0.7)
Eosinophils Relative: 2 %
HCT: 33.8 % — ABNORMAL LOW (ref 35.0–47.0)
HEMOGLOBIN: 11.8 g/dL — AB (ref 12.0–16.0)
LYMPHS ABS: 1.6 10*3/uL (ref 1.0–3.6)
Lymphocytes Relative: 27 %
MCH: 33.1 pg (ref 26.0–34.0)
MCHC: 35 g/dL (ref 32.0–36.0)
MCV: 94.6 fL (ref 80.0–100.0)
Monocytes Absolute: 0.5 10*3/uL (ref 0.2–0.9)
Monocytes Relative: 9 %
NEUTROS PCT: 63 %
Neutro Abs: 3.6 10*3/uL (ref 1.4–6.5)
Platelets: 227 10*3/uL (ref 150–440)
RBC: 3.57 MIL/uL — AB (ref 3.80–5.20)
RDW: 14.7 % — ABNORMAL HIGH (ref 11.5–14.5)
WBC: 5.8 10*3/uL (ref 3.6–11.0)

## 2018-06-05 ENCOUNTER — Other Ambulatory Visit: Payer: Self-pay

## 2018-06-05 ENCOUNTER — Observation Stay: Payer: Managed Care, Other (non HMO)

## 2018-06-05 ENCOUNTER — Observation Stay
Admission: RE | Admit: 2018-06-05 | Discharge: 2018-06-06 | Disposition: A | Discharge status: Home / Self Care | Payer: Managed Care, Other (non HMO) | Source: Ambulatory Visit | Attending: Nephrology | Admitting: Nephrology

## 2018-06-05 DIAGNOSIS — R809 Proteinuria, unspecified: Secondary | ICD-10-CM | POA: Diagnosis not present

## 2018-06-05 DIAGNOSIS — R319 Hematuria, unspecified: Secondary | ICD-10-CM | POA: Diagnosis present

## 2018-06-05 DIAGNOSIS — R3129 Other microscopic hematuria: Secondary | ICD-10-CM | POA: Diagnosis present

## 2018-06-05 LAB — CBC
HCT: 32.2 % — ABNORMAL LOW (ref 35.0–47.0)
Hemoglobin: 11 g/dL — ABNORMAL LOW (ref 12.0–16.0)
MCH: 32.4 pg (ref 26.0–34.0)
MCHC: 34.1 g/dL (ref 32.0–36.0)
MCV: 95.2 fL (ref 80.0–100.0)
PLATELETS: 216 10*3/uL (ref 150–440)
RBC: 3.38 MIL/uL — AB (ref 3.80–5.20)
RDW: 14.6 % — ABNORMAL HIGH (ref 11.5–14.5)
WBC: 7.2 10*3/uL (ref 3.6–11.0)

## 2018-06-05 MED ORDER — OXYCODONE HCL 5 MG PO TABS: 5.0000 mg | ORAL_TABLET | Freq: Four times a day (QID) | ORAL | Status: DC | PRN

## 2018-06-05 MED ORDER — SODIUM CHLORIDE 0.9 % IV SOLN
INTRAVENOUS | Status: DC | PRN
  Administered 2018-06-05: 09:00:00 via INTRAVENOUS

## 2018-06-05 MED ORDER — ACETAMINOPHEN 325 MG PO TABS: 650.0000 mg | ORAL_TABLET | Freq: Four times a day (QID) | ORAL | Status: DC | PRN

## 2018-06-05 NOTE — Procedures (Addendum)
PROCEDURE: Informed written consent was obtained from the patient after a discussion of the risks, benefits and alternatives to treatment. The patient understands and consents the procedure. A timeout was performed prior to the initiation of the procedure.  Ultrasound scanning was performed of the bilateral flanks. The inferior pole of the RIGHT kidney was selected for biopsy due to location and sonographic window. The procedure was planned. The operative site was prepped and draped in the usual sterile fashion. The overlying soft tissues were anesthetized with 10 mL of 1% XYLOCAINE.  A 18 gauge core needle biopsy device was advanced into the inferior cortex of the LEFT kidney and six core biopsies were obtained under direct ultrasound guidance. Real time pathologic review confirmed adequate tissue acquisition of two renal cores. Images were saved for documentation purposes. The biopsy device was removed and hemostasis was obtained with manual compression. Post procedural scanning was negative for significant post procedural hemorrhage or additional complication. A dressing was placed. The patient tolerated the procedure well without immediate post procedural complication.

## 2018-06-05 NOTE — H&P (Signed)
[place], [place] 06/05/18  Subjective:   Patient admitted observation for Kidney biopsy ANA + Hematuria Proteinuria No fever, chills, cough No dysuria Stopped antiplatelet agents Brilinta 5 days prior, aspirin 3 days prior  Objective:  Vital signs in last 24 hours:  Temp:  [98.1 F (36.7 C)] 98.1 F (36.7 C) (09/23 0748) Pulse Rate:  [51] 51 (09/23 0748) BP: (128)/(72) 128/72 (09/23 0748) SpO2:  [97 %] 97 % (09/23 0748)  Weight change:  There were no vitals filed for this visit.  Intake/Output:   No intake or output data in the 24 hours ending 06/05/18 0819   Physical Exam: General: NAD, sitting up in chair  HEENT Anicteric, moist oral mucus membranes  Neck Supple  Pulm/lungs Clear to auscultation  CVS/Heart regular  Abdomen:  Soft, NT  Extremities: No edema  Neurologic: No focal deficit  Skin: No acute rashes          Basic Metabolic Panel:  Recent Labs  Lab 06/02/18 0900  NA 139  K 3.9  CL 106  CO2 21*  GLUCOSE 98  BUN 21  CREATININE 1.23*  CALCIUM 8.9     CBC: Recent Labs  Lab 06/02/18 0900  WBC 5.8  NEUTROABS 3.6  HGB 11.8*  HCT 33.8*  MCV 94.6  PLT 227     No results found for: HEPBSAG, HEPBSAB, HEPBIGM    Microbiology:  No results found for this or any previous visit (from the past 240 hour(s)).  Coagulation Studies: Recent Labs    06/02/18 0900  LABPROT 12.3  INR 0.92    Urinalysis: Recent Labs    06/02/18 0848  COLORURINE YELLOW*  LABSPEC 1.008  PHURINE 6.0  GLUCOSEU NEGATIVE  HGBUR SMALL*  BILIRUBINUR NEGATIVE  KETONESUR NEGATIVE  PROTEINUR 30*  NITRITE NEGATIVE  LEUKOCYTESUR LARGE*    Results for Leah Espinoza, Leah Espinoza I (MRN [id number]) as of 06/05/2018 08:30  Ref. Range 06/02/2018 08:48  Bacteria, UA Latest Ref Range: NONE SEEN  RARE (A)  Mucus Unknown PRESENT  RBC / HPF Latest Ref Range: 0 - 5 RBC/hpf 11-20  Squamous Epithelial / LPF Latest Ref Range: 0 - 5  0-5  WBC, UA  Latest Ref Range: 0 - 5 WBC/hpf 6-10  Total Protein, Urine Latest Units: mg/dL 70  Protein Creatinine Ratio Latest Ref Range: 0.00 - 0.15 mg/mgCre 0.95 (H)  Creatinine, Urine Latest Units: mg/dL 74    Imaging: No results found.   Medications:   . sodium chloride      sodium chloride  Assessment/ Plan:  62 y.o.caucasian  female with coronary disease, proteinuria, Hematuria  Plan  U/s guided Kidney biopsy today   S Creatinine Trends 07/28/2016 12:06 Creatinine: 0.96  04/22/2017 08:40 Creatinine: 0.98  11/02/2017 09:14 Creatinine: 1.09 (H)  02/03/2018 08:41 Creatinine: 1.24 (H)  02/20/2018 11:27 Creatinine: 1.38 (H)  05/24/2018 ANA TITER  > OR = 1:1280    LOS: 0 Leah Espinoza 9/23/20198:19 AM  [place], [place]  Note: This note was prepared with Dragon dictation. Any transcription errors are unintentional

## 2018-06-06 DIAGNOSIS — R3129 Other microscopic hematuria: Secondary | ICD-10-CM | POA: Diagnosis not present

## 2018-06-06 LAB — CBC
HCT: 32.8 % — ABNORMAL LOW (ref 35.0–47.0)
Hemoglobin: 11.4 g/dL — ABNORMAL LOW (ref 12.0–16.0)
MCH: 32.9 pg (ref 26.0–34.0)
MCHC: 34.6 g/dL (ref 32.0–36.0)
MCV: 95.1 fL (ref 80.0–100.0)
PLATELETS: 225 10*3/uL (ref 150–440)
RBC: 3.45 MIL/uL — AB (ref 3.80–5.20)
RDW: 14.6 % — AB (ref 11.5–14.5)
WBC: 6.2 10*3/uL (ref 3.6–11.0)

## 2018-06-06 LAB — ANA W/REFLEX IF POSITIVE: ANA: NEGATIVE

## 2018-06-06 MED ORDER — ACETAMINOPHEN 325 MG PO TABS: 650.0000 mg | ORAL_TABLET | Freq: Four times a day (QID) | ORAL | 0 refills | Status: DC | PRN

## 2018-06-06 NOTE — Plan of Care (Signed)
Pt had biopsy of L kidney yesterday b/c of elevated creatinine levels.  Dressing is clean, dry and intact.  Pt anticipates early d/c.  Having slight headache.

## 2018-06-06 NOTE — Discharge Summary (Signed)
[facility], [place] 06/06/18  Subjective:   Doing well No c/o today No hematuria No pain  Objective:  Vital signs in last 24 hours:  Temp:  [97.8 F (36.6 C)-98.4 F (36.9 C)] 97.9 F (36.6 C) (09/24 0423) Pulse Rate:  [51-65] 51 (09/24 0423) Resp:  [16-18] 16 (09/24 0423) BP: (102-156)/(60-74) 124/72 (09/24 0423) SpO2:  [96 %-98 %] 98 % (09/24 0423)  Weight change:  Filed Weights   06/05/18 0748  Weight: 71.7 kg    Intake/Output:    Intake/Output Summary (Last 24 hours) at 06/06/2018 0900 Last data filed at 06/05/2018 1850 Gross per 24 hour  Intake 303 ml  Output -  Net 303 ml     Physical Exam: General: NAD, laying inbed  HEENT Moist oral mucus membranes  Neck supple  Pulm/lungs clear  CVS/Heart regular  Abdomen:  Soft, NT  Extremities: No edema  Neurologic: Alert, oreinted  Skin: No rashes          Basic Metabolic Panel:  Recent Labs  Lab 06/02/18 0900  NA 139  K 3.9  CL 106  CO2 21*  GLUCOSE 98  BUN 21  CREATININE 1.23*  CALCIUM 8.9     CBC: Recent Labs  Lab 06/02/18 0900 06/05/18 2100 06/06/18 0412  WBC 5.8 7.2 6.2  NEUTROABS 3.6  --   --   HGB 11.8* 11.0* 11.4*  HCT 33.8* 32.2* 32.8*  MCV 94.6 95.2 95.1  PLT 227 216 225     No results found for: HEPBSAG, HEPBSAB, HEPBIGM    Microbiology:  No results found for this or any previous visit (from the past 240 hour(s)).  Coagulation Studies: No results for input(s): LABPROT, INR in the last 72 hours.  Urinalysis: No results for input(s): COLORURINE, LABSPEC, PHURINE, GLUCOSEU, HGBUR, BILIRUBINUR, KETONESUR, PROTEINUR, UROBILINOGEN, NITRITE, LEUKOCYTESUR in the last 72 hours.  Invalid input(s): APPERANCEUR    Imaging: No results found.   Medications:   . sodium chloride Stopped (06/05/18 1100)    sodium chloride, acetaminophen, oxyCODONE  Assessment/ Plan:  62 y.o. female with hematuria, proteinuria  Left Renal biopsy  06/05/2018  Follow up next week for results   LOS: 0 Calhoun Reichardt 9/24/20199:00 AM  [place], [place]  Note: This note was prepared with Dragon dictation. Any transcription errors are unintentional

## 2018-06-08 ENCOUNTER — Telehealth: Payer: Self-pay | Admitting: Internal Medicine

## 2018-06-08 NOTE — Telephone Encounter (Signed)
Tried to contact patient for TCM no answer or voicemail on mobile , called home phone left voicemail to please return call to office.

## 2018-06-09 ENCOUNTER — Encounter: Payer: Self-pay | Admitting: Internal Medicine

## 2018-06-15 ENCOUNTER — Encounter: Payer: Self-pay | Admitting: Nephrology

## 2018-06-15 LAB — SURGICAL PATHOLOGY

## 2018-07-06 IMAGING — US US RENAL
1 series · 14 of 25 positions shown · non-contrast
Comparison: None.

CLINICAL DATA: Worsening renal function.  Elevated creatinine.

EXAM:
RENAL / URINARY TRACT ULTRASOUND COMPLETE

[Series 1: us renal · 0.23mm/px · 14 of 38 slices shown]
[im 1/38]
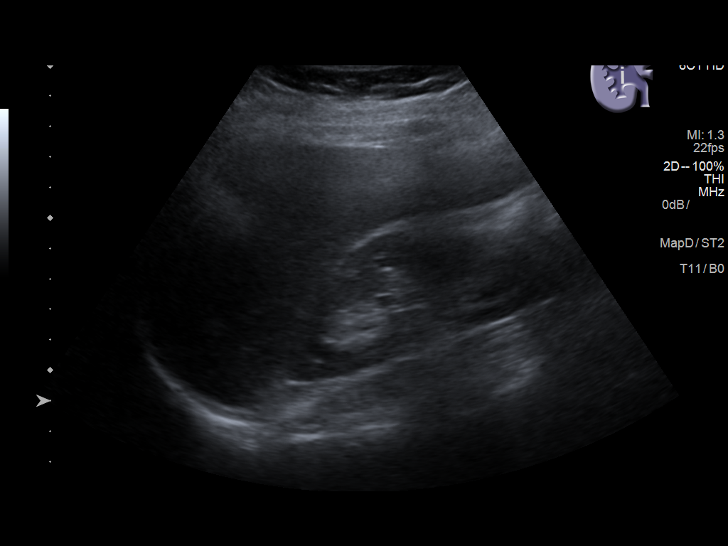
[im 4/38]
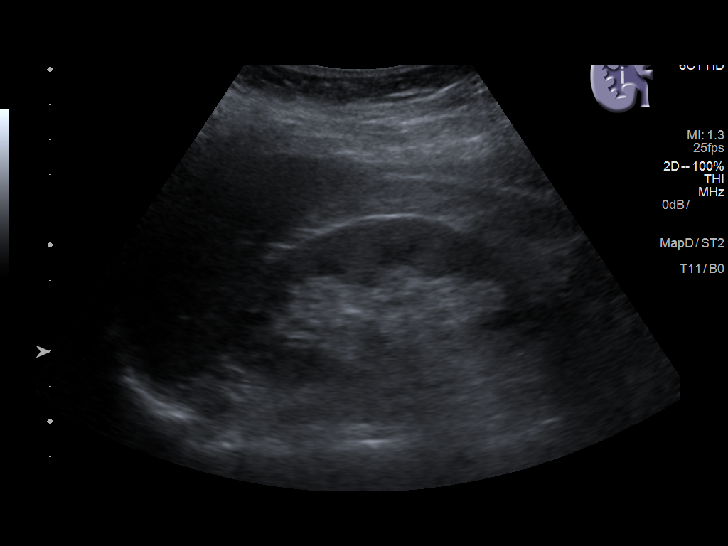
[im 7/38]
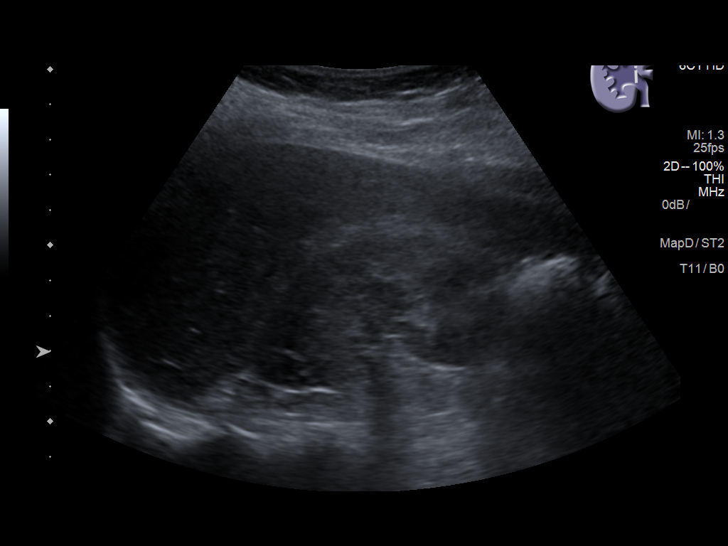
[im 10/38]
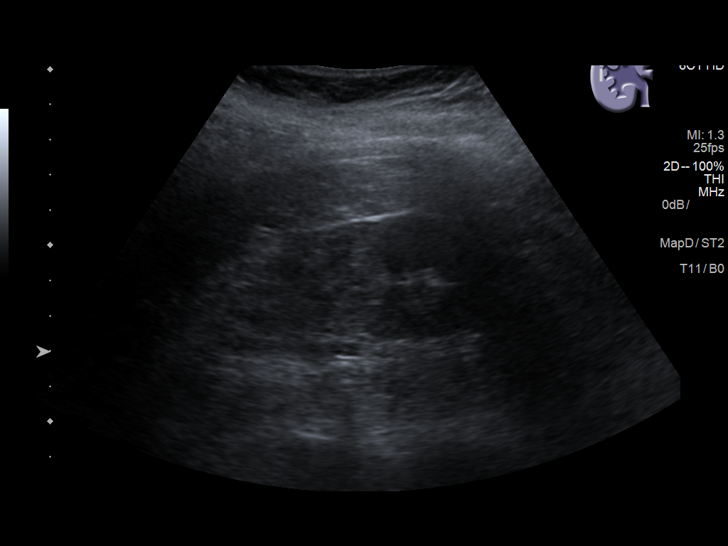
[im 13/38]
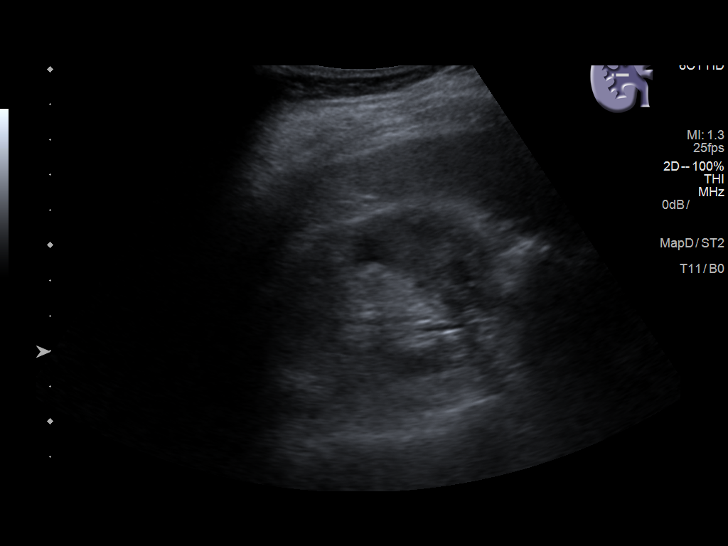
[im 14/38]
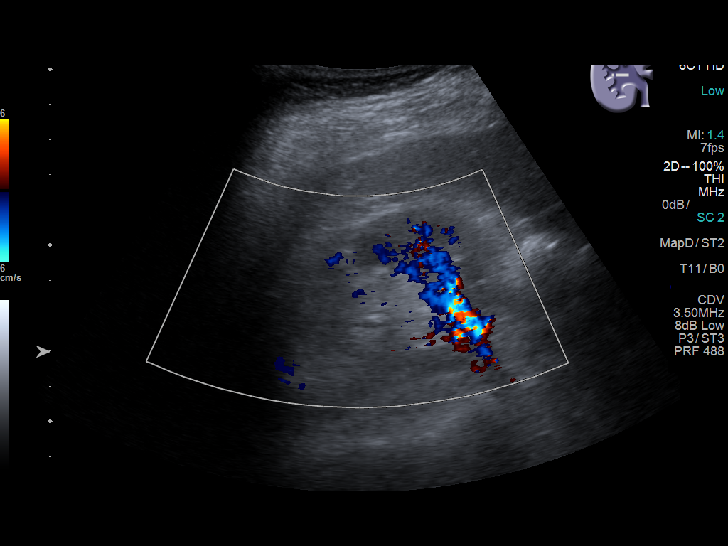
[im 17/38]
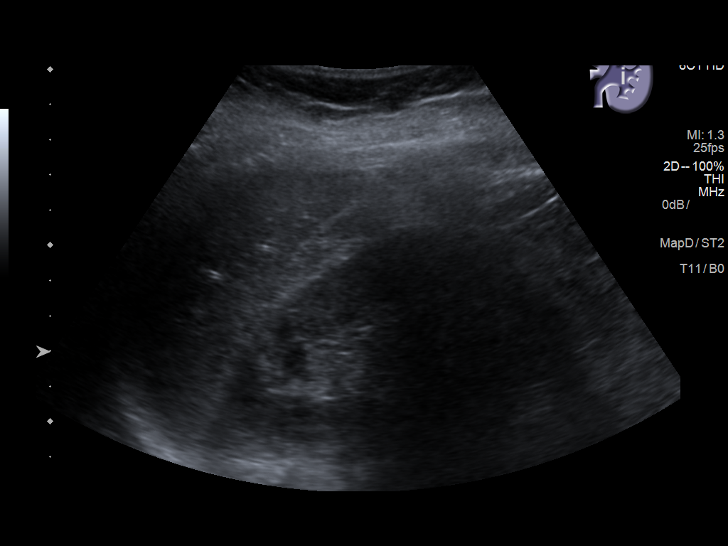
[im 21/38]
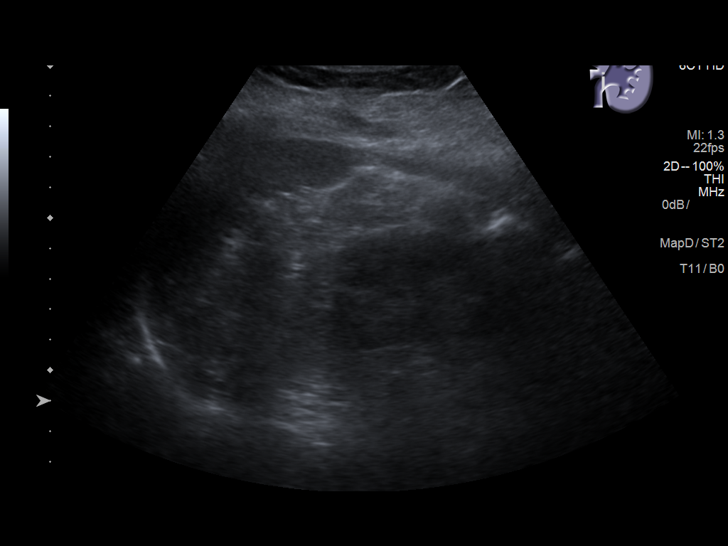
[im 24/38]
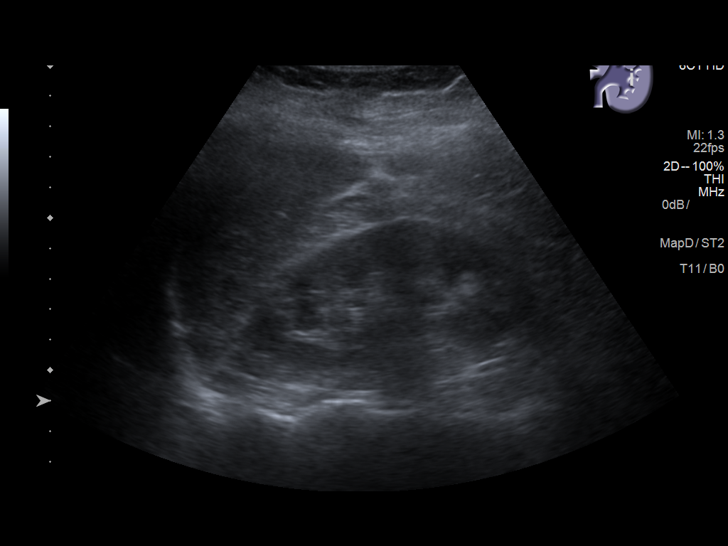
[im 25/38]
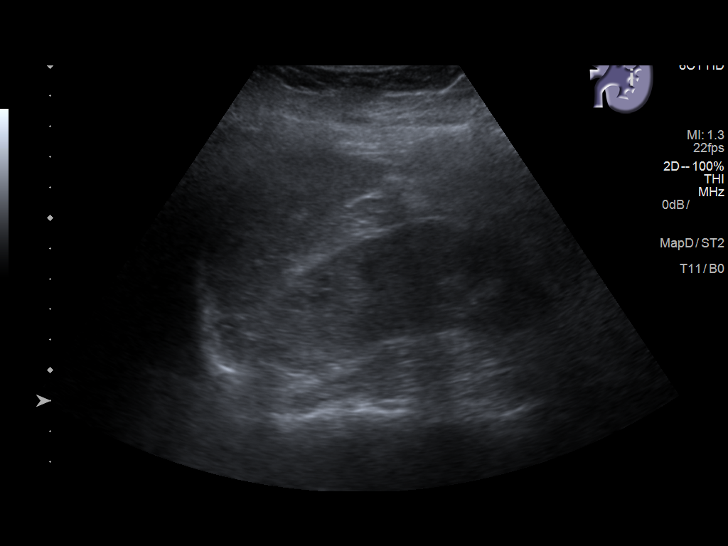
[im 28/38]
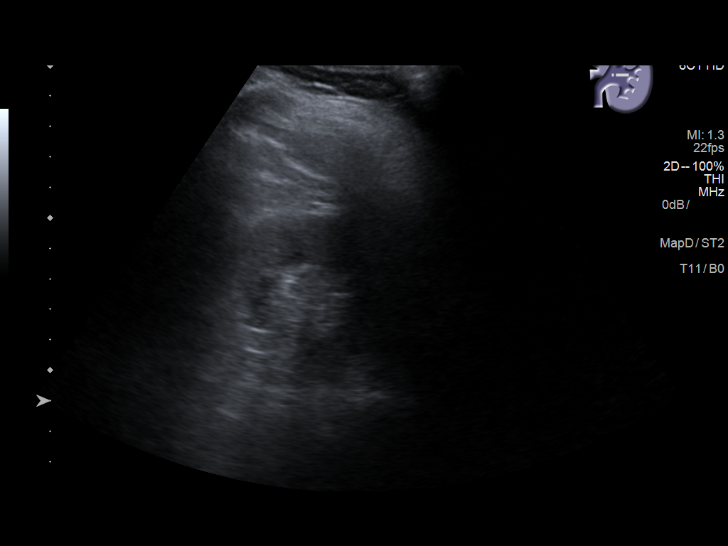
[im 31/38]
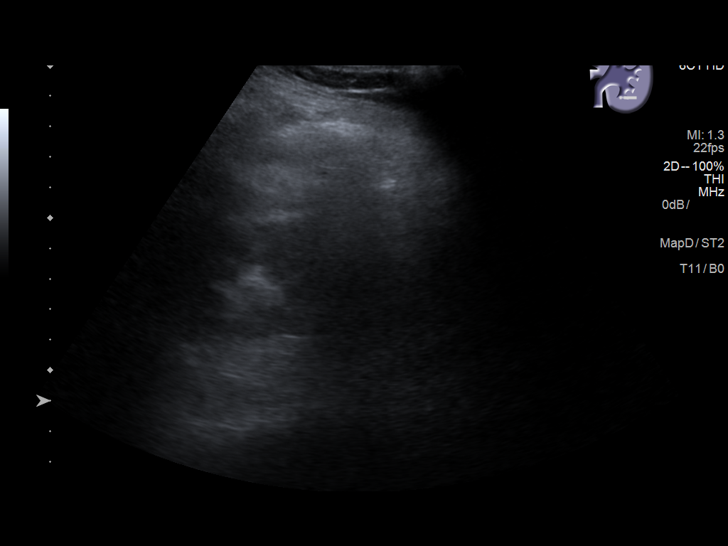
[im 34/38]
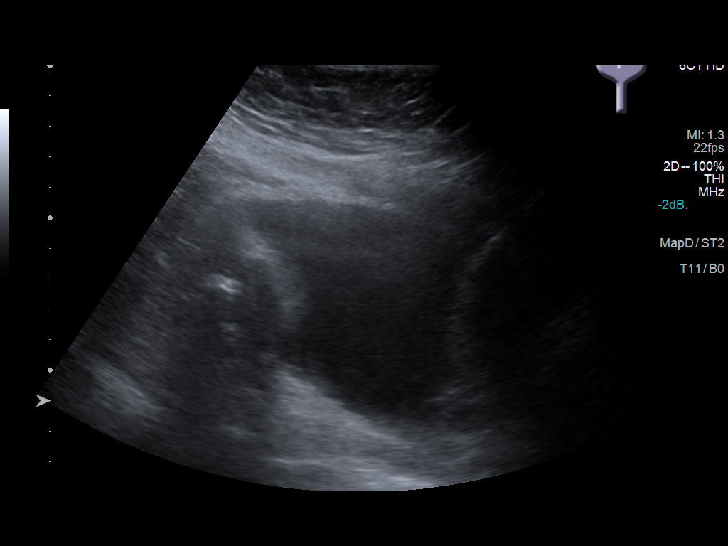
[im 38/38]
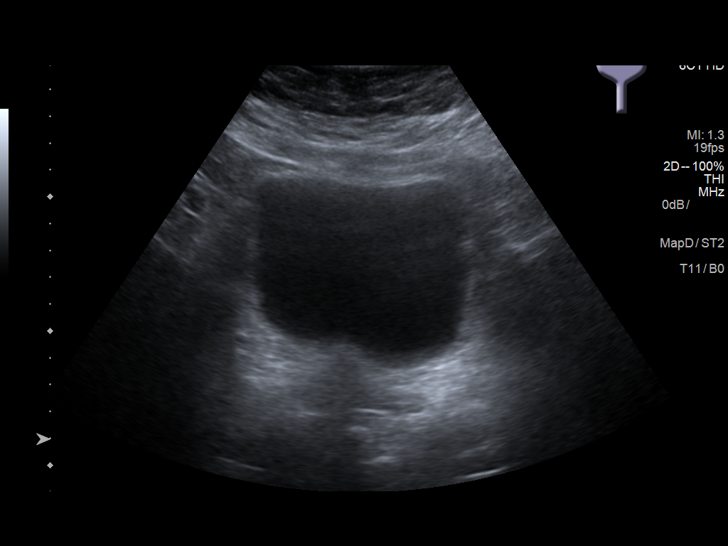

[14 of 25 positions shown; findings below may reference images not displayed]

FINDINGS: Right Kidney:

Length: 9.0 cm. Echogenicity within normal limits. No mass or
hydronephrosis visualized.

Left Kidney:

Length: 9.9 cm. Echogenicity within normal limits. No mass or
hydronephrosis visualized.

Bladder:

Appears normal for degree of bladder distention.
IMPRESSION: Normal renal ultrasound.  No explanation for elevated creatinine.

## 2018-08-11 ENCOUNTER — Other Ambulatory Visit: Payer: Self-pay | Admitting: Nephrology

## 2018-08-11 DIAGNOSIS — N067 Isolated proteinuria with diffuse crescentic glomerulonephritis: Secondary | ICD-10-CM

## 2018-08-14 ENCOUNTER — Other Ambulatory Visit: Payer: Self-pay | Admitting: Nephrology

## 2018-08-14 ENCOUNTER — Ambulatory Visit: Payer: Managed Care, Other (non HMO) | Admitting: Internal Medicine

## 2018-08-14 DIAGNOSIS — Z0289 Encounter for other administrative examinations: Secondary | ICD-10-CM

## 2018-08-14 DIAGNOSIS — N067 Isolated proteinuria with diffuse crescentic glomerulonephritis: Secondary | ICD-10-CM

## 2018-08-15 ENCOUNTER — Other Ambulatory Visit
Admission: RE | Admit: 2018-08-15 | Discharge: 2018-08-15 | Disposition: A | Discharge status: Home / Self Care | Payer: 59 | Source: Ambulatory Visit | Attending: Nephrology | Admitting: Nephrology

## 2018-08-15 DIAGNOSIS — N067 Isolated proteinuria with diffuse crescentic glomerulonephritis: Secondary | ICD-10-CM | POA: Insufficient documentation

## 2018-08-15 LAB — COMPREHENSIVE METABOLIC PANEL
ALK PHOS: 59 U/L (ref 38–126)
ALT: 189 U/L — ABNORMAL HIGH (ref 0–44)
ANION GAP: 13 (ref 5–15)
AST: 108 U/L — ABNORMAL HIGH (ref 15–41)
Albumin: 3.4 g/dL — ABNORMAL LOW (ref 3.5–5.0)
BUN: 71 mg/dL — ABNORMAL HIGH (ref 8–23)
CO2: 22 mmol/L (ref 22–32)
Calcium: 8.8 mg/dL — ABNORMAL LOW (ref 8.9–10.3)
Chloride: 107 mmol/L (ref 98–111)
Creatinine, Ser: 4.4 mg/dL — ABNORMAL HIGH (ref 0.44–1.00)
GFR calc Af Amer: 12 mL/min — ABNORMAL LOW (ref >60)
GFR calc non Af Amer: 10 mL/min — ABNORMAL LOW (ref >60)
GLUCOSE: 108 mg/dL — AB (ref 70–99)
Potassium: 4.1 mmol/L (ref 3.5–5.1)
Sodium: 142 mmol/L (ref 135–145)
Total Bilirubin: 0.7 mg/dL (ref 0.3–1.2)
Total Protein: 7 g/dL (ref 6.5–8.1)

## 2018-08-15 LAB — CBC
HCT: 30.9 % — ABNORMAL LOW (ref 36.0–46.0)
Hemoglobin: 9.9 g/dL — ABNORMAL LOW (ref 12.0–15.0)
MCH: 31.6 pg (ref 26.0–34.0)
MCHC: 32 g/dL (ref 30.0–36.0)
MCV: 98.7 fL (ref 80.0–100.0)
Platelets: 286 10*3/uL (ref 150–400)
RBC: 3.13 MIL/uL — ABNORMAL LOW (ref 3.87–5.11)
RDW: 15.6 % — ABNORMAL HIGH (ref 11.5–15.5)
WBC: 4.1 10*3/uL (ref 4.0–10.5)
nRBC: 0 % (ref 0.0–0.2)

## 2018-08-16 LAB — ANCA TITERS
Atypical P-ANCA titer: <1:20 {titer}
C-ANCA: <1:20 {titer}
P-ANCA: <1:20 {titer}

## 2018-08-16 LAB — ANA W/REFLEX IF POSITIVE: Anti Nuclear Antibody(ANA): NEGATIVE

## 2018-08-16 LAB — GLOMERULAR BASEMENT MEMBRANE ANTIBODIES: GBM Ab: 2 units (ref 0–20)

## 2018-08-17 LAB — QUANTIFERON-TB GOLD PLUS (RQFGPL)
QuantiFERON Mitogen Value: 7.55 IU/mL
QuantiFERON Nil Value: 0.05 IU/mL
QuantiFERON TB1 Ag Value: 0.04 IU/mL
QuantiFERON TB2 Ag Value: 0.04 IU/mL

## 2018-08-17 LAB — QUANTIFERON-TB GOLD PLUS: QuantiFERON-TB Gold Plus: NEGATIVE

## 2018-08-27 ENCOUNTER — Other Ambulatory Visit: Payer: Self-pay | Admitting: Nephrology

## 2018-08-27 DIAGNOSIS — N067 Isolated proteinuria with diffuse crescentic glomerulonephritis: Secondary | ICD-10-CM

## 2018-08-28 ENCOUNTER — Other Ambulatory Visit: Payer: Self-pay | Admitting: Nephrology

## 2018-08-28 ENCOUNTER — Other Ambulatory Visit
Admission: RE | Admit: 2018-08-28 | Discharge: 2018-08-28 | Disposition: A | Discharge status: Home / Self Care | Payer: 59 | Source: Ambulatory Visit | Attending: Nephrology | Admitting: Nephrology

## 2018-08-28 DIAGNOSIS — N057 Unspecified nephritic syndrome with diffuse crescentic glomerulonephritis: Secondary | ICD-10-CM | POA: Insufficient documentation

## 2018-08-28 DIAGNOSIS — N067 Isolated proteinuria with diffuse crescentic glomerulonephritis: Secondary | ICD-10-CM

## 2018-08-28 DIAGNOSIS — T380X5S Adverse effect of glucocorticoids and synthetic analogues, sequela: Secondary | ICD-10-CM | POA: Insufficient documentation

## 2018-08-28 LAB — BASIC METABOLIC PANEL
Anion gap: 12 (ref 5–15)
BUN: 69 mg/dL — ABNORMAL HIGH (ref 8–23)
CO2: 20 mmol/L — ABNORMAL LOW (ref 22–32)
CREATININE: 4.48 mg/dL — AB (ref 0.44–1.00)
Calcium: 8.7 mg/dL — ABNORMAL LOW (ref 8.9–10.3)
Chloride: 106 mmol/L (ref 98–111)
GFR calc non Af Amer: 10 mL/min — ABNORMAL LOW (ref >60)
GFR, EST AFRICAN AMERICAN: 11 mL/min — AB (ref >60)
Glucose, Bld: 112 mg/dL — ABNORMAL HIGH (ref 70–99)
Potassium: 3.8 mmol/L (ref 3.5–5.1)
SODIUM: 138 mmol/L (ref 135–145)

## 2018-08-28 LAB — CBC
HCT: 28.9 % — ABNORMAL LOW (ref 36.0–46.0)
Hemoglobin: 9.6 g/dL — ABNORMAL LOW (ref 12.0–15.0)
MCH: 31 pg (ref 26.0–34.0)
MCHC: 33.2 g/dL (ref 30.0–36.0)
MCV: 93.2 fL (ref 80.0–100.0)
NRBC: 0 % (ref 0.0–0.2)
Platelets: 251 10*3/uL (ref 150–400)
RBC: 3.1 MIL/uL — ABNORMAL LOW (ref 3.87–5.11)
RDW: 14.9 % (ref 11.5–15.5)
WBC: 8.3 10*3/uL (ref 4.0–10.5)

## 2018-08-28 LAB — PROTIME-INR
INR: 0.91
Prothrombin Time: 12.2 seconds (ref 11.4–15.2)

## 2018-08-28 MED ORDER — METOPROLOL TARTRATE 25 MG PO TABS
25.00 | ORAL_TABLET | ORAL | Status: DC
Start: 2018-08-29 — End: 2018-08-28

## 2018-08-28 MED ORDER — GENERIC EXTERNAL MEDICATION
5.00 | Status: DC
Start: ? — End: 2018-08-28

## 2018-08-28 MED ORDER — SODIUM BICARBONATE 650 MG PO TABS
650.00 | ORAL_TABLET | ORAL | Status: DC
Start: 2018-08-29 — End: 2018-08-28

## 2018-08-28 MED ORDER — GENERIC EXTERNAL MEDICATION
Status: DC
Start: ? — End: 2018-08-28

## 2018-08-28 MED ORDER — PREDNISONE 10 MG PO TABS
10.00 | ORAL_TABLET | ORAL | Status: DC
Start: 2018-08-30 — End: 2018-08-28

## 2018-08-28 MED ORDER — POLYETHYLENE GLYCOL 3350 17 G PO PACK
17.00 | PACK | ORAL | Status: DC
Start: ? — End: 2018-08-28

## 2018-08-28 MED ORDER — ACETAMINOPHEN 500 MG PO TABS
1000.00 | ORAL_TABLET | ORAL | Status: DC
Start: ? — End: 2018-08-28

## 2018-08-28 MED ORDER — GENERIC EXTERNAL MEDICATION
75.00 | Status: DC
Start: 2018-08-29 — End: 2018-08-28

## 2018-08-28 MED ORDER — ONDANSETRON 4 MG PO TBDP
4.00 | ORAL_TABLET | ORAL | Status: DC
Start: ? — End: 2018-08-28

## 2018-08-28 MED ORDER — GENERIC EXTERNAL MEDICATION
2.00 | Status: DC
Start: ? — End: 2018-08-28

## 2018-08-29 MED ORDER — EPINEPHRINE 0.3 MG/0.3ML IJ SOAJ
0.30 | INTRAMUSCULAR | Status: DC
Start: ? — End: 2018-08-29

## 2018-08-29 MED ORDER — SODIUM CHLORIDE 0.9 % IV SOLN
200.00 | INTRAVENOUS | Status: DC
Start: ? — End: 2018-08-29

## 2018-08-29 MED ORDER — MEPERIDINE HCL 25 MG/ML IJ SOLN
25.00 | INTRAMUSCULAR | Status: DC
Start: ? — End: 2018-08-29

## 2018-08-29 MED ORDER — DIPHENHYDRAMINE HCL 50 MG/ML IJ SOLN
25.00 | INTRAMUSCULAR | Status: DC
Start: ? — End: 2018-08-29

## 2018-08-29 MED ORDER — SODIUM CHLORIDE 0.9 % IV SOLN
1000.00 | INTRAVENOUS | Status: DC
Start: ? — End: 2018-08-29

## 2018-08-29 MED ORDER — LORAZEPAM 2 MG/ML IJ SOLN
1.00 | INTRAMUSCULAR | Status: DC
Start: ? — End: 2018-08-29

## 2018-08-29 MED ORDER — METHYLPREDNISOLONE SODIUM SUCC 125 MG IJ SOLR
125.00 | INTRAMUSCULAR | Status: DC
Start: ? — End: 2018-08-29

## 2018-08-29 MED ORDER — FAMOTIDINE 20 MG/2ML IV SOLN
20.00 | INTRAVENOUS | Status: DC
Start: ? — End: 2018-08-29

## 2018-08-29 MED ORDER — DEXAMETHASONE SODIUM PHOSPHATE 4 MG/ML IJ SOLN
20.00 | INTRAMUSCULAR | Status: DC
Start: ? — End: 2018-08-29

## 2018-08-29 MED ORDER — PROMETHAZINE HCL 25 MG PO TABS
25.00 | ORAL_TABLET | ORAL | Status: DC
Start: ? — End: 2018-08-29

## 2018-08-29 MED ORDER — SODIUM CHLORIDE 0.9 % IV SOLN
20.00 | INTRAVENOUS | Status: DC
Start: ? — End: 2018-08-29

## 2018-08-29 MED ORDER — SEVELAMER CARBONATE 800 MG PO TABS
1600.00 | ORAL_TABLET | ORAL | Status: DC
Start: 2018-08-29 — End: 2018-08-29

## 2018-08-30 MED ORDER — PANTOPRAZOLE SODIUM 40 MG PO TBEC
40.00 | DELAYED_RELEASE_TABLET | ORAL | Status: DC
Start: 2018-09-02 — End: 2018-08-30

## 2018-08-30 MED ORDER — LORAZEPAM 1 MG PO TABS
0.50 | ORAL_TABLET | ORAL | Status: DC
Start: ? — End: 2018-08-30

## 2018-08-30 MED ORDER — TRAZODONE HCL 50 MG PO TABS
50.00 | ORAL_TABLET | ORAL | Status: DC
Start: 2018-09-01 — End: 2018-08-30

## 2018-08-30 MED ORDER — SEVELAMER CARBONATE 800 MG PO TABS
800.00 | ORAL_TABLET | ORAL | Status: DC
Start: 2018-08-31 — End: 2018-08-30

## 2018-08-30 MED ORDER — METOPROLOL TARTRATE 25 MG PO TABS
12.50 | ORAL_TABLET | ORAL | Status: DC
Start: 2018-09-02 — End: 2018-08-30

## 2018-08-30 MED ORDER — CHOLECALCIFEROL 25 MCG (1000 UT) PO TABS
5000.00 | ORAL_TABLET | ORAL | Status: DC
Start: 2018-09-02 — End: 2018-08-30

## 2018-08-30 MED ORDER — GENERIC EXTERNAL MEDICATION
50.00 | Status: DC
Start: ? — End: 2018-08-30

## 2018-09-01 MED ORDER — TICAGRELOR 90 MG PO TABS
90.00 | ORAL_TABLET | ORAL | Status: DC
Start: 2018-09-01 — End: 2018-09-01

## 2018-09-01 MED ORDER — ASPIRIN 81 MG PO CHEW
81.00 | CHEWABLE_TABLET | ORAL | Status: DC
Start: 2018-09-02 — End: 2018-09-01

## 2018-09-01 MED ORDER — PREDNISONE 10 MG PO TABS
10.00 | ORAL_TABLET | ORAL | Status: DC
Start: 2018-09-02 — End: 2018-09-01

## 2018-09-14 ENCOUNTER — Encounter: Payer: Self-pay | Admitting: Internal Medicine

## 2018-09-14 ENCOUNTER — Ambulatory Visit (INDEPENDENT_AMBULATORY_CARE_PROVIDER_SITE_OTHER): Payer: 59 | Admitting: Internal Medicine

## 2018-09-14 VITALS — BP 120/72 | HR 91 | Temp 98.3°F | Resp 16 | Wt 145.6 lb

## 2018-09-14 DIAGNOSIS — E7800 Pure hypercholesterolemia, unspecified: Secondary | ICD-10-CM

## 2018-09-14 DIAGNOSIS — I700 Atherosclerosis of aorta: Secondary | ICD-10-CM

## 2018-09-14 DIAGNOSIS — I252 Old myocardial infarction: Secondary | ICD-10-CM

## 2018-09-14 DIAGNOSIS — R531 Weakness: Secondary | ICD-10-CM

## 2018-09-14 DIAGNOSIS — N059 Unspecified nephritic syndrome with unspecified morphologic changes: Secondary | ICD-10-CM

## 2018-09-14 NOTE — Progress Notes (Addendum)
Patient ID: Leah Espinoza, female   DOB: May 08, 1956, 63 y.o.   MRN: [id number]   Subjective:    Patient ID: Leah Espinoza, female    DOB: 04/30/1956, 63 y.o.   MRN: [id number]  HPI  Patient here for a hospital follow up.  Was admitted to [facility] 08/28/18 - 09/01/18. 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  Being treated with IV cyclophosphamide and steroids.  On prednisone 10mg  q day now.  Saw nephrology today for f/u. Labs drawn.  Was found to have elevated LFTs.  crestor was stopped.  She is here to discuss possibly restarting crestor.  Her hgb is down.  She is eating better.  Appetite is improving.  No nausea or vomiting.  No chest pain.  No sob.  Some fatigue, but is improving.  No abdominal pain.  Bowels moving.  No urine change.  Still with some weakness, contributing to unsteadiness.  Discussed physical therapy.  She is in agreement.  Has f/u at [facility] 09/21/[facility].     Past Medical History:  Diagnosis Date  . Family history of adverse reaction to anesthesia    Sister - PONV  . History of chicken pox   . History of fainting   . Hyperlipidemia   . Hypertension   . MI (myocardial infarction) ([place])   . Motion sickness    circular motion  . Vertigo    no episodes for several months   Past Surgical History:  Procedure Laterality Date  . CARDIAC CATHETERIZATION N/A 12/23/2015   Procedure: Left Heart Cath and Coronary Angiography;  Surgeon: Isaias Cowman, MD;  Location: [place] CV LAB;  Service: Cardiovascular;  Laterality: N/A;  . CARDIAC CATHETERIZATION N/A 12/23/2015   Procedure: Coronary Stent Intervention;  Surgeon: Isaias Cowman, MD;  Location: [place] CV LAB;  Service: Cardiovascular;  Laterality: N/A;  . CATARACT EXTRACTION W/PHACO Right 07/27/2017   Procedure: CATARACT EXTRACTION PHACO AND INTRAOCULAR LENS PLACEMENT ([place]) RIGHT SYMFONY LENS;  Surgeon: Leandrew Koyanagi, MD;  Location: [place];  Service: Ophthalmology;   Laterality: Right;  . CATARACT EXTRACTION W/PHACO Left 08/10/2017   Procedure: CATARACT EXTRACTION PHACO AND INTRAOCULAR LENS PLACEMENT ([place]) LEFT SYMFONY TORIC LENS;  Surgeon: Leandrew Koyanagi, MD;  Location: [place];  Service: Ophthalmology;  Laterality: Left;  . COLONOSCOPY     Family History  Problem Relation Age of Onset  . Hyperlipidemia Mother   . Heart disease Mother   . Cancer Father        lung  . Sudden death Brother   . Arthritis Maternal Grandmother    Social History   Socioeconomic History  . Marital status: Married    Spouse name: Not on file  . Number of children: Not on file  . Years of education: Not on file  . Highest education level: Not on file  Occupational History  . Not on file  Social Needs  . Financial resource strain: Not on file  . Food insecurity:    Worry: Not on file    Inability: Not on file  . Transportation needs:    Medical: Not on file    Non-medical: Not on file  Tobacco Use  . Smoking status: Former Research scientist (life sciences)  . Smokeless tobacco: Never Used  . Tobacco comment: smoked for approx 1 yr around age 63  Substance and Sexual Activity  . Alcohol use: No    Alcohol/week: 0.0 standard drinks  . Drug use: No  . Sexual activity: Not on file  Lifestyle  .  Physical activity:    Days per week: Not on file    Minutes per session: Not on file  . Stress: Not on file  Relationships  . Social connections:    Talks on phone: Not on file    Gets together: Not on file    Attends religious service: Not on file    Active member of club or organization: Not on file    Attends meetings of clubs or organizations: Not on file    Relationship status: Not on file  Other Topics Concern  . Not on file  Social History Narrative  . Not on file    Outpatient Encounter Medications as of 09/14/2018  Medication Sig  . acetaminophen (TYLENOL) 325 MG tablet Take 2 tablets (650 mg total) by mouth every 6 (six) hours as needed for mild pain or  moderate pain.  Marland Kitchen aspirin EC 81 MG EC tablet Take 1 tablet (81 mg total) by mouth daily.  . metoprolol tartrate (LOPRESSOR) 25 MG tablet TAKE ONE-HALF TABLET BY MOUTH TWICE DAILY  . predniSONE (DELTASONE) 5 MG tablet Take 10 mg by mouth daily.  . ticagrelor (BRILINTA) 90 MG TABS tablet Take 1 tablet (90 mg total) by mouth 2 (two) times daily.  . [DISCONTINUED] rosuvastatin (CRESTOR) 40 MG tablet Take 1 tablet (40 mg total) by mouth daily.   No facility-administered encounter medications on file as of 09/14/2018.     Review of Systems  Constitutional: Positive for fatigue. Negative for fever.       Has lost weight.  Appetite improving.    HENT: Negative for congestion and sinus pressure.   Respiratory: Negative for cough, chest tightness and shortness of breath.   Cardiovascular: Negative for chest pain, palpitations and leg swelling.  Gastrointestinal: Negative for abdominal pain, diarrhea, nausea and vomiting.  Genitourinary: Negative for difficulty urinating and dysuria.  Musculoskeletal: Negative for joint swelling and myalgias.  Neurological: Negative for dizziness, light-headedness and headaches.  Psychiatric/Behavioral: Negative for agitation and dysphoric mood.       Objective:    Physical Exam Constitutional:      General: She is not in acute distress.    Appearance: Normal appearance.  HENT:     Nose: Nose normal. No congestion.     Mouth/Throat:     Pharynx: No oropharyngeal exudate or posterior oropharyngeal erythema.  Neck:     Musculoskeletal: Neck supple.     Thyroid: No thyromegaly.  Cardiovascular:     Rate and Rhythm: Normal rate and regular rhythm.  Pulmonary:     Effort: No respiratory distress.     Breath sounds: Normal breath sounds. No wheezing.  Abdominal:     General: Bowel sounds are normal.     Palpations: Abdomen is soft.     Tenderness: There is no abdominal tenderness.  Musculoskeletal:        General: No swelling or tenderness.    Lymphadenopathy:     Cervical: No cervical adenopathy.  Skin:    Findings: No erythema or rash.  Neurological:     Mental Status: She is alert.  Psychiatric:        Mood and Affect: Mood normal.        Behavior: Behavior normal.     BP 120/72 (BP Location: Left Arm, Patient Position: Sitting, Cuff Size: Normal)   Pulse 91   Temp 98.3 F (36.8 C) (Oral)   Resp 16   Wt 145 lb 9.6 oz (66 kg)   SpO2 97%  BMI 27.51 kg/m  Wt Readings from Last 3 Encounters:  09/14/18 145 lb 9.6 oz (66 kg)  06/05/18 158 lb (71.7 kg)  05/08/18 156 lb 12.8 oz (71.1 kg)     Lab Results  Component Value Date   WBC 8.3 08/28/2018   HGB 9.6 (L) 08/28/2018   HCT 28.9 (L) 08/28/2018   PLT 251 08/28/2018   GLUCOSE 112 (H) 08/28/2018   CHOL 165 02/03/2018   TRIG 74.0 02/03/2018   HDL 57.00 02/03/2018   LDLCALC 93 02/03/2018   ALT 189 (H) 08/15/2018   AST 108 (H) 08/15/2018   NA 138 08/28/2018   K 3.8 08/28/2018   CL 106 08/28/2018   CREATININE 4.48 (H) 08/28/2018   BUN 69 (H) 08/28/2018   CO2 20 (L) 08/28/2018   TSH 1.990 11/02/2017   INR 0.91 08/28/2018       Assessment & Plan:   Problem List Items Addressed This Visit    Aortic atherosclerosis ([place])    Off crestor.  Will obtain recent labs.  Plan to restart.        Glomerulonephritis    On prednisone and IV cyclophosphamide.  Being followed by nephrology.  Obtain labs.        Relevant Orders   Ambulatory referral to Physical Therapy   History of ST elevation myocardial infarction (STEMI)    Followed by cardiology.  Stable.  Continue risk factor modification.        Hypercholesterolemia    Has been on crestor.  Off now secondary to elevated LFTs.  Will obtain recent labs.  Discussed treatment.        Other Visit Diagnoses    Weakness    -  Primary   With weakness and unsteadiness.  Refer to physical therapy for evaluation and treatment.     Relevant Orders   Ambulatory referral to Physical Therapy       Einar Pheasant, MD

## 2018-09-16 ENCOUNTER — Encounter: Payer: Self-pay | Admitting: Internal Medicine

## 2018-09-16 DIAGNOSIS — N059 Unspecified nephritic syndrome with unspecified morphologic changes: Secondary | ICD-10-CM | POA: Insufficient documentation

## 2018-09-16 NOTE — Assessment & Plan Note (Signed)
Followed by cardiology.  Stable.  Continue risk factor modification.   

## 2018-09-16 NOTE — Assessment & Plan Note (Signed)
On prednisone and IV cyclophosphamide.  Being followed by nephrology.  Obtain labs.

## 2018-09-16 NOTE — Assessment & Plan Note (Signed)
Off crestor.  Will obtain recent labs.  Plan to restart.

## 2018-09-16 NOTE — Assessment & Plan Note (Signed)
Has been on crestor.  Off now secondary to elevated LFTs.  Will obtain recent labs.  Discussed treatment.

## 2018-09-21 LAB — HEPATIC FUNCTION PANEL
ALT: 36 — AB (ref 7–35)
AST: 35 (ref 13–35)
Alkaline Phosphatase: 50 (ref 25–125)

## 2018-09-21 LAB — CBC AND DIFFERENTIAL
HCT: 28 — AB (ref 36–46)
HEMOGLOBIN: 9 — AB (ref 12.0–16.0)
Neutrophils Absolute: 7
Platelets: 288 (ref 150–399)
WBC: 8.2

## 2018-09-21 LAB — BASIC METABOLIC PANEL
BUN: 40 — AB (ref 4–21)
Creatinine: 1.8 — AB (ref 0.5–1.1)
Glucose: 127
Potassium: 5 (ref 3.4–5.3)
Sodium: 138 (ref 137–147)

## 2018-09-29 ENCOUNTER — Telehealth: Payer: Self-pay | Admitting: *Deleted

## 2018-09-29 NOTE — Telephone Encounter (Signed)
Copied from [place]. Topic: General - Other >> Sep 29, 2018  3:33 PM Oneta Rack wrote: Relation to pt: self  Call back number: [phone]    Reason for call:  Patient inquiring about cholesterol medication due to lab results and stated PCP was going to change the Vitamin D strength

## 2018-10-02 ENCOUNTER — Other Ambulatory Visit: Payer: Self-pay | Admitting: Internal Medicine

## 2018-10-02 NOTE — Telephone Encounter (Signed)
She can restart the crestor.  We will need to follow the liver function tests.  Schedule her for f/u liver panel in 2-3 weeks.  I do not see a vitamin D level (result) on these latest labs.  How much is she taking now?

## 2018-10-02 NOTE — Telephone Encounter (Signed)
Regarding the vitamin D, the nephrologist here in town usually check and follow this.  Does she have a f/u scheduled with nephrology here in town (Dr Candiss Norse).  If so, they will let her know what dose of vitamin D to take.  If no f/u, we can check here.  Just let me know.  Just don't want to duplicate labs.

## 2018-10-02 NOTE — Telephone Encounter (Signed)
Patient says that she is not taking any Vitamin D at this time. Will need level checked. Advised that we could check Vit D with liver panel that I scheduled in 2 weeks. Patient says that is okay with her

## 2018-10-02 NOTE — Progress Notes (Signed)
Opened in error

## 2018-10-02 NOTE — Telephone Encounter (Signed)
Patient is not taking any vitamin D. Liver panel scheduled.

## 2018-10-02 NOTE — Telephone Encounter (Signed)
Placed note from Dr. Candiss Norse on your desk that has recent labs in them. Needed labs before restarting crestor and I am unsure about the Vitamin D.

## 2018-10-03 NOTE — Telephone Encounter (Signed)
Unable to leave message for patient

## 2018-10-03 NOTE — Telephone Encounter (Signed)
Unable to leave message for pt.

## 2018-10-04 NOTE — Telephone Encounter (Signed)
Pt does not have f/u with Dr. Candiss Norse. Had MRA done yesterday by nephrology in [facility]. Waiting on results. Advised we would check Vitamin D level here with her other labs

## 2018-10-05 ENCOUNTER — Other Ambulatory Visit: Payer: Self-pay | Admitting: Internal Medicine

## 2018-10-05 DIAGNOSIS — E7800 Pure hypercholesterolemia, unspecified: Secondary | ICD-10-CM

## 2018-10-05 DIAGNOSIS — N189 Chronic kidney disease, unspecified: Secondary | ICD-10-CM

## 2018-10-05 NOTE — Telephone Encounter (Signed)
Order placed for vitamin D lab.   

## 2018-10-18 ENCOUNTER — Other Ambulatory Visit (INDEPENDENT_AMBULATORY_CARE_PROVIDER_SITE_OTHER): Payer: 59

## 2018-10-18 ENCOUNTER — Telehealth: Payer: Self-pay | Admitting: Internal Medicine

## 2018-10-18 DIAGNOSIS — N189 Chronic kidney disease, unspecified: Secondary | ICD-10-CM | POA: Diagnosis not present

## 2018-10-18 DIAGNOSIS — E7800 Pure hypercholesterolemia, unspecified: Secondary | ICD-10-CM

## 2018-10-18 LAB — HEPATIC FUNCTION PANEL
ALT: 21 U/L (ref 0–35)
AST: 16 U/L (ref 0–37)
Albumin: 3.8 g/dL (ref 3.5–5.2)
Alkaline Phosphatase: 54 U/L (ref 39–117)
Bilirubin, Direct: 0.1 mg/dL (ref 0.0–0.3)
Total Bilirubin: 0.4 mg/dL (ref 0.2–1.2)
Total Protein: 6.7 g/dL (ref 6.0–8.3)

## 2018-10-18 LAB — VITAMIN D 25 HYDROXY (VIT D DEFICIENCY, FRACTURES): VITD: 22.67 ng/mL — ABNORMAL LOW (ref 30.00–100.00)

## 2018-10-18 NOTE — Telephone Encounter (Signed)
Pt requesting referral for PT

## 2018-10-18 NOTE — Telephone Encounter (Signed)
Leah Espinoza, can you help me with this.  At her last visit, I placed order for referral to PT.  It shows up in my note, but I do not see this referral in the referral section.  Do I need to place another order for referral?  Just let me know what I need to do.

## 2018-10-18 NOTE — Telephone Encounter (Signed)
I think Dr. Nicki Reaper meant to route this over to you

## 2018-10-18 NOTE — Telephone Encounter (Signed)
Pt states she never got a call for PT. Patient would like a referral to [facility] Therapy.

## 2018-10-25 ENCOUNTER — Ambulatory Visit (INDEPENDENT_AMBULATORY_CARE_PROVIDER_SITE_OTHER): Payer: 59 | Admitting: Internal Medicine

## 2018-10-25 ENCOUNTER — Encounter: Payer: Self-pay | Admitting: Internal Medicine

## 2018-10-25 VITALS — BP 130/70 | HR 71 | Temp 99.0°F | Wt 147.0 lb

## 2018-10-25 DIAGNOSIS — I700 Atherosclerosis of aorta: Secondary | ICD-10-CM

## 2018-10-25 DIAGNOSIS — R0981 Nasal congestion: Secondary | ICD-10-CM | POA: Diagnosis not present

## 2018-10-25 DIAGNOSIS — E7800 Pure hypercholesterolemia, unspecified: Secondary | ICD-10-CM | POA: Diagnosis not present

## 2018-10-25 DIAGNOSIS — F439 Reaction to severe stress, unspecified: Secondary | ICD-10-CM

## 2018-10-25 DIAGNOSIS — N059 Unspecified nephritic syndrome with unspecified morphologic changes: Secondary | ICD-10-CM | POA: Diagnosis not present

## 2018-10-25 NOTE — Progress Notes (Signed)
Patient ID: Leah Espinoza, female   DOB: [birth year], 63 y.o.   MRN: [id number]   Subjective:    Patient ID: Leah Espinoza, female    DOB: 19-Mar-1956, 63 y.o.   MRN: [id number]  HPI  Patient here for a scheduled follow up.  She reports she is doing relatively well.  Some increased stress with her medical issues. Recently creatinine decreased on recent check.  Planning to f/u with nephrology.  On prednisone.  No chest pain.  Breathing stable.  No acid reflux.  No abdominal pain.  Bowels moving.  Some tingling hands/feet.  Going to PT.  Some nasal stuffiness.  No sinus pressure.  No chest congestion. No cough.     Past Medical History:  Diagnosis Date  . Family history of adverse reaction to anesthesia    Sister - PONV  . History of chicken pox   . History of fainting   . Hyperlipidemia   . Hypertension   . MI (myocardial infarction) ([place])   . Motion sickness    circular motion  . Vertigo    no episodes for several months   Past Surgical History:  Procedure Laterality Date  . CARDIAC CATHETERIZATION N/A 12/23/2015   Procedure: Left Heart Cath and Coronary Angiography;  Surgeon: Isaias Cowman, MD;  Location: [place] CV LAB;  Service: Cardiovascular;  Laterality: N/A;  . CARDIAC CATHETERIZATION N/A 12/23/2015   Procedure: Coronary Stent Intervention;  Surgeon: Isaias Cowman, MD;  Location: [place] CV LAB;  Service: Cardiovascular;  Laterality: N/A;  . CATARACT EXTRACTION W/PHACO Right 07/27/2017   Procedure: CATARACT EXTRACTION PHACO AND INTRAOCULAR LENS PLACEMENT ([place]) RIGHT SYMFONY LENS;  Surgeon: Leandrew Koyanagi, MD;  Location: [place];  Service: Ophthalmology;  Laterality: Right;  . CATARACT EXTRACTION W/PHACO Left 08/10/2017   Procedure: CATARACT EXTRACTION PHACO AND INTRAOCULAR LENS PLACEMENT ([place]) LEFT SYMFONY TORIC LENS;  Surgeon: Leandrew Koyanagi, MD;  Location: [place];  Service: Ophthalmology;  Laterality: Left;  .  COLONOSCOPY     Family History  Problem Relation Age of Onset  . Hyperlipidemia Mother   . Heart disease Mother   . Cancer Father        lung  . Sudden death Brother   . Arthritis Maternal Grandmother    Social History   Socioeconomic History  . Marital status: Married    Spouse name: Not on file  . Number of children: Not on file  . Years of education: Not on file  . Highest education level: Not on file  Occupational History  . Not on file  Social Needs  . Financial resource strain: Not on file  . Food insecurity:    Worry: Not on file    Inability: Not on file  . Transportation needs:    Medical: Not on file    Non-medical: Not on file  Tobacco Use  . Smoking status: Former Research scientist (life sciences)  . Smokeless tobacco: Never Used  . Tobacco comment: smoked for approx 1 yr around age 15  Substance and Sexual Activity  . Alcohol use: No    Alcohol/week: 0.0 standard drinks  . Drug use: No  . Sexual activity: Not on file  Lifestyle  . Physical activity:    Days per week: Not on file    Minutes per session: Not on file  . Stress: Not on file  Relationships  . Social connections:    Talks on phone: Not on file    Gets together: Not on file  Attends religious service: Not on file    Active member of club or organization: Not on file    Attends meetings of clubs or organizations: Not on file    Relationship status: Not on file  Other Topics Concern  . Not on file  Social History Narrative  . Not on file    Outpatient Encounter Medications as of 10/25/2018  Medication Sig  . acetaminophen (TYLENOL) 325 MG tablet Take 2 tablets (650 mg total) by mouth every 6 (six) hours as needed for mild pain or moderate pain.  Marland Kitchen aspirin EC 81 MG EC tablet Take 1 tablet (81 mg total) by mouth daily.  Marland Kitchen azaTHIOprine (IMURAN) 50 MG tablet   . metoprolol tartrate (LOPRESSOR) 25 MG tablet TAKE ONE-HALF TABLET BY MOUTH TWICE DAILY  . predniSONE (DELTASONE) 5 MG tablet Take 10 mg by mouth daily.    . rosuvastatin (CRESTOR) 40 MG tablet   . ticagrelor (BRILINTA) 90 MG TABS tablet Take 1 tablet (90 mg total) by mouth 2 (two) times daily.   No facility-administered encounter medications on file as of 10/25/2018.     Review of Systems  Constitutional: Negative for appetite change and unexpected weight change.  HENT: Positive for congestion. Negative for sinus pressure.   Respiratory: Negative for cough, chest tightness and shortness of breath.   Cardiovascular: Negative for chest pain, palpitations and leg swelling.  Gastrointestinal: Negative for abdominal pain, diarrhea, nausea and vomiting.  Genitourinary: Negative for difficulty urinating and dysuria.  Musculoskeletal: Negative for joint swelling and myalgias.  Skin: Negative for color change and rash.  Neurological: Negative for dizziness, light-headedness and headaches.  Psychiatric/Behavioral: Negative for agitation and dysphoric mood.       Objective:    Physical Exam Constitutional:      General: She is not in acute distress.    Appearance: Normal appearance.  HENT:     Nose: Nose normal. No congestion.     Mouth/Throat:     Pharynx: No oropharyngeal exudate or posterior oropharyngeal erythema.  Neck:     Musculoskeletal: Neck supple. No muscular tenderness.     Thyroid: No thyromegaly.  Cardiovascular:     Rate and Rhythm: Normal rate and regular rhythm.  Pulmonary:     Effort: No respiratory distress.     Breath sounds: Normal breath sounds. No wheezing.  Abdominal:     General: Bowel sounds are normal.     Palpations: Abdomen is soft.     Tenderness: There is no abdominal tenderness.  Musculoskeletal:        General: No swelling or tenderness.  Lymphadenopathy:     Cervical: No cervical adenopathy.  Skin:    Findings: No erythema or rash.  Neurological:     Mental Status: She is alert.  Psychiatric:        Mood and Affect: Mood normal.        Behavior: Behavior normal.     BP 130/70   Pulse 71    Temp 99 F (37.2 C) (Oral)   Wt 147 lb (66.7 kg)   SpO2 97%   BMI 27.78 kg/m  Wt Readings from Last 3 Encounters:  10/25/18 147 lb (66.7 kg)  09/14/18 145 lb 9.6 oz (66 kg)  06/05/18 158 lb (71.7 kg)     Lab Results  Component Value Date   WBC 8.2 09/21/2018   HGB 9.0 (A) 09/21/2018   HCT 28 (A) 09/21/2018   PLT 288 09/21/2018   GLUCOSE 112 (H) 08/28/2018  CHOL 165 02/03/2018   TRIG 74.0 02/03/2018   HDL 57.00 02/03/2018   LDLCALC 93 02/03/2018   ALT 21 10/18/2018   AST 16 10/18/2018   NA 138 09/21/2018   K 5.0 09/21/2018   CL 106 08/28/2018   CREATININE 1.8 (A) 09/21/2018   BUN 40 (A) 09/21/2018   CO2 20 (L) 08/28/2018   TSH 1.990 11/02/2017   INR 0.91 08/28/2018       Assessment & Plan:   Problem List Items Addressed This Visit    Aortic atherosclerosis ([place])    Back on crestor.  Low cholesterol diet and exercise.  Follow lipid panel and liver function tests.        Relevant Medications   rosuvastatin (CRESTOR) 40 MG tablet   Glomerulonephritis    On prednisone.  Followed by nephrology.        Relevant Orders   Basic metabolic panel   Hypercholesterolemia    Just started back on crestor.  Low cholesterol diet and exercise.  Follow lipid panel and liver function tests.        Relevant Medications   rosuvastatin (CRESTOR) 40 MG tablet   Other Relevant Orders   TSH   Lipid panel   Hepatic function panel   Stress    Increased stress.  Discussed with her today.  States she has good support.  Does not feel needs anything more at this time.  Follow.         Other Visit Diagnoses    Nasal congestion    -  Primary   Nasacort nasal spray as directed.  Follow.         Einar Pheasant, MD

## 2018-10-25 NOTE — Patient Instructions (Signed)
Saline nasal spray - flush nose at least 2-3x/day  nasacort nasal spray - 2 sprays each nostril one time per day.  Do this in the evening.  

## 2018-10-30 ENCOUNTER — Encounter: Payer: Self-pay | Admitting: Internal Medicine

## 2018-10-30 NOTE — Assessment & Plan Note (Signed)
On prednisone.  Followed by nephrology.

## 2018-10-30 NOTE — Assessment & Plan Note (Signed)
Back on crestor.  Low cholesterol diet and exercise.  Follow lipid panel and liver function tests.

## 2018-10-30 NOTE — Assessment & Plan Note (Signed)
Increased stress.  Discussed with her today.  States she has good support.  Does not feel needs anything more at this time.  Follow.

## 2018-10-30 NOTE — Assessment & Plan Note (Signed)
Just started back on crestor.  Low cholesterol diet and exercise.  Follow lipid panel and liver function tests.

## 2018-11-24 DIAGNOSIS — N189 Chronic kidney disease, unspecified: Secondary | ICD-10-CM | POA: Insufficient documentation

## 2018-11-28 MED ORDER — AMLODIPINE BESYLATE 5 MG PO TABS
5.00 | ORAL_TABLET | ORAL | Status: DC
Start: 2018-11-29 — End: 2018-11-28

## 2018-11-28 MED ORDER — ACETAMINOPHEN 500 MG PO TABS
1000.00 | ORAL_TABLET | ORAL | Status: DC
Start: ? — End: 2018-11-28

## 2018-11-28 MED ORDER — GUAIFENESIN-DM 100-10 MG/5ML PO SYRP
5.00 | ORAL_SOLUTION | ORAL | Status: DC
Start: ? — End: 2018-11-28

## 2018-11-28 MED ORDER — HEPARIN SODIUM (PORCINE) 1000 UNIT/ML IJ SOLN
1000.00 | INTRAMUSCULAR | Status: DC
Start: ? — End: 2018-11-28

## 2018-11-28 MED ORDER — ALBUTEROL SULFATE (2.5 MG/3ML) 0.083% IN NEBU
2.50 | INHALATION_SOLUTION | RESPIRATORY_TRACT | Status: DC
Start: ? — End: 2018-11-28

## 2018-11-28 MED ORDER — TRAZODONE HCL 50 MG PO TABS
50.00 | ORAL_TABLET | ORAL | Status: DC
Start: ? — End: 2018-11-28

## 2018-11-28 MED ORDER — POLYETHYLENE GLYCOL 3350 17 G PO PACK
17.00 | PACK | ORAL | Status: DC
Start: ? — End: 2018-11-28

## 2018-11-28 MED ORDER — TRAZODONE HCL 50 MG PO TABS
50.00 | ORAL_TABLET | ORAL | Status: DC
Start: 2018-11-28 — End: 2018-11-28

## 2018-11-28 MED ORDER — GENERIC EXTERNAL MEDICATION
1.20 | Status: DC
Start: ? — End: 2018-11-28

## 2018-11-28 MED ORDER — GENERIC EXTERNAL MEDICATION
1.40 | Status: DC
Start: ? — End: 2018-11-28

## 2018-11-28 MED ORDER — HYDRALAZINE HCL 25 MG PO TABS
25.00 | ORAL_TABLET | ORAL | Status: DC
Start: 2018-11-28 — End: 2018-11-28

## 2018-11-28 MED ORDER — MELATONIN 3 MG PO TABS
3.00 | ORAL_TABLET | ORAL | Status: DC
Start: ? — End: 2018-11-28

## 2018-11-28 MED ORDER — CALCIUM ACETATE (PHOS BINDER) 667 MG PO CAPS
1334.00 | ORAL_CAPSULE | ORAL | Status: DC
Start: 2018-11-28 — End: 2018-11-28

## 2018-11-28 MED ORDER — GENERIC EXTERNAL MEDICATION
4.00 | Status: DC
Start: ? — End: 2018-11-28

## 2018-11-28 MED ORDER — GENERIC EXTERNAL MEDICATION
1.00 | Status: DC
Start: ? — End: 2018-11-28

## 2018-11-28 MED ORDER — METOPROLOL TARTRATE 25 MG PO TABS
12.50 | ORAL_TABLET | ORAL | Status: DC
Start: 2018-11-28 — End: 2018-11-28

## 2018-11-28 MED ORDER — IPRATROPIUM BROMIDE 0.02 % IN SOLN
500.00 | RESPIRATORY_TRACT | Status: DC
Start: ? — End: 2018-11-28

## 2018-11-30 DIAGNOSIS — E875 Hyperkalemia: Secondary | ICD-10-CM | POA: Insufficient documentation

## 2018-11-30 DIAGNOSIS — D696 Thrombocytopenia, unspecified: Secondary | ICD-10-CM | POA: Insufficient documentation

## 2018-12-05 MED ORDER — HEPARIN SODIUM (PORCINE) 1000 UNIT/ML IJ SOLN
1.20 | INTRAMUSCULAR | Status: DC
Start: ? — End: 2018-12-05

## 2018-12-05 MED ORDER — GENERIC EXTERNAL MEDICATION
5000.00 | Status: DC
Start: ? — End: 2018-12-05

## 2018-12-05 MED ORDER — MORPHINE SULFATE 2 MG/ML IJ SOLN
1.00 | INTRAMUSCULAR | Status: DC
Start: ? — End: 2018-12-05

## 2018-12-05 MED ORDER — ESOMEPRAZOLE MAGNESIUM 40 MG PO PACK
40.00 | PACK | ORAL | Status: DC
Start: 2019-02-23 — End: 2018-12-05

## 2018-12-05 MED ORDER — SODIUM CHLORIDE 0.9 % IV SOLN
INTRAVENOUS | Status: DC
Start: ? — End: 2018-12-05

## 2018-12-05 MED ORDER — ACETAMINOPHEN 500 MG PO TABS
1000.00 | ORAL_TABLET | ORAL | Status: DC
Start: ? — End: 2018-12-05

## 2018-12-05 MED ORDER — ACETAMINOPHEN 325 MG PO TABS
650.00 | ORAL_TABLET | ORAL | Status: DC
Start: 2018-12-05 — End: 2018-12-05

## 2018-12-05 MED ORDER — IMMUNE GLOBULIN (HUMAN) 40 GM/400ML IV SOLN
0.40 | INTRAVENOUS | Status: DC
Start: 2018-12-05 — End: 2018-12-05

## 2018-12-05 MED ORDER — HEPARIN SODIUM (PORCINE) 1000 UNIT/ML IJ SOLN
1.40 | INTRAMUSCULAR | Status: DC
Start: ? — End: 2018-12-05

## 2018-12-05 MED ORDER — ASPIRIN 81 MG PO CHEW
81.00 | CHEWABLE_TABLET | ORAL | Status: DC
Start: 2018-12-13 — End: 2018-12-05

## 2018-12-05 MED ORDER — CHLORHEXIDINE GLUCONATE 0.12 % MT SOLN
5.00 | OROMUCOSAL | Status: DC
Start: 2018-12-12 — End: 2018-12-05

## 2018-12-05 MED ORDER — GENERIC EXTERNAL MEDICATION
30.00 | Status: DC
Start: 2018-12-05 — End: 2018-12-05

## 2018-12-05 MED ORDER — GENERIC EXTERNAL MEDICATION
25.00 | Status: DC
Start: 2018-12-05 — End: 2018-12-05

## 2018-12-05 MED ORDER — GENERIC EXTERNAL MEDICATION
15.00 | Status: DC
Start: 2018-12-05 — End: 2018-12-05

## 2018-12-12 MED ORDER — GABAPENTIN 250 MG/5ML PO SOLN
100.00 | ORAL | Status: DC
Start: 2018-12-12 — End: 2018-12-12

## 2018-12-12 MED ORDER — GENERIC EXTERNAL MEDICATION
0.00 | Status: DC
Start: ? — End: 2018-12-12

## 2018-12-12 MED ORDER — SODIUM CHLORIDE 3 % IN NEBU
4.00 | INHALATION_SOLUTION | RESPIRATORY_TRACT | Status: DC
Start: 2018-12-12 — End: 2018-12-12

## 2018-12-12 MED ORDER — DICLOFENAC SODIUM 1 % TD GEL
2.00 | TRANSDERMAL | Status: DC
Start: 2019-02-22 — End: 2018-12-12

## 2018-12-12 MED ORDER — INSULIN REGULAR HUMAN 100 UNIT/ML IJ SOLN
0.00 | INTRAMUSCULAR | Status: DC
Start: 2018-12-12 — End: 2018-12-12

## 2018-12-12 MED ORDER — INSULIN NPH (HUMAN) (ISOPHANE) 100 UNIT/ML ~~LOC~~ SUSP
10.00 | SUBCUTANEOUS | Status: DC
Start: 2018-12-12 — End: 2018-12-12

## 2018-12-12 MED ORDER — ACETAMINOPHEN 650 MG/20.3ML PO SOLN
500.00 | ORAL | Status: DC
Start: 2018-12-12 — End: 2018-12-12

## 2018-12-12 MED ORDER — ALBUTEROL SULFATE (2.5 MG/3ML) 0.083% IN NEBU
2.50 | INHALATION_SOLUTION | RESPIRATORY_TRACT | Status: DC
Start: 2018-12-12 — End: 2018-12-12

## 2018-12-12 MED ORDER — GENERIC EXTERNAL MEDICATION
5000.00 | Status: DC
Start: ? — End: 2018-12-12

## 2018-12-12 MED ORDER — GENERIC EXTERNAL MEDICATION
15.00 | Status: DC
Start: 2018-12-12 — End: 2018-12-12

## 2018-12-12 MED ORDER — OXYCODONE HCL 5 MG PO TABS
5.00 | ORAL_TABLET | ORAL | Status: DC
Start: ? — End: 2018-12-12

## 2018-12-12 MED ORDER — DEXTROSE 10 % IV SOLN
25.00 | INTRAVENOUS | Status: DC
Start: ? — End: 2018-12-12

## 2018-12-12 MED ORDER — HEPARIN SODIUM (PORCINE) 5000 UNIT/ML IJ SOLN
5000.00 | INTRAMUSCULAR | Status: DC
Start: 2018-12-12 — End: 2018-12-12

## 2018-12-12 MED ORDER — POLYETHYLENE GLYCOL 3350 17 G PO PACK
17.00 | PACK | ORAL | Status: DC
Start: ? — End: 2018-12-12

## 2018-12-12 MED ORDER — ALTEPLASE 2 MG IJ SOLR
2.00 | INTRAMUSCULAR | Status: DC
Start: ? — End: 2018-12-12

## 2018-12-18 DIAGNOSIS — Z8674 Personal history of sudden cardiac arrest: Secondary | ICD-10-CM | POA: Insufficient documentation

## 2018-12-18 DIAGNOSIS — J9601 Acute respiratory failure with hypoxia: Secondary | ICD-10-CM | POA: Insufficient documentation

## 2018-12-18 DIAGNOSIS — N186 End stage renal disease: Secondary | ICD-10-CM | POA: Insufficient documentation

## 2018-12-18 DIAGNOSIS — Z930 Tracheostomy status: Secondary | ICD-10-CM | POA: Insufficient documentation

## 2019-02-01 DIAGNOSIS — F4321 Adjustment disorder with depressed mood: Secondary | ICD-10-CM | POA: Insufficient documentation

## 2019-02-02 ENCOUNTER — Other Ambulatory Visit: Payer: 59

## 2019-02-07 ENCOUNTER — Ambulatory Visit: Payer: 59 | Admitting: Internal Medicine

## 2019-02-07 MED ORDER — GENERIC EXTERNAL MEDICATION
14000.00 | Status: DC
Start: ? — End: 2019-02-07

## 2019-02-07 MED ORDER — Medication
1.90 | Status: DC
Start: ? — End: 2019-02-07

## 2019-02-07 MED ORDER — CHLORHEXIDINE GLUCONATE 0.12 % MT SOLN
5.00 | OROMUCOSAL | Status: DC
Start: 2019-02-22 — End: 2019-02-07

## 2019-02-07 MED ORDER — TRAZODONE HCL 50 MG PO TABS
25.00 | ORAL_TABLET | ORAL | Status: DC
Start: ? — End: 2019-02-07

## 2019-02-07 MED ORDER — RICOLA HERB MT
80.00 | OROMUCOSAL | Status: DC
Start: ? — End: 2019-02-07

## 2019-02-07 MED ORDER — MIRTAZAPINE 30 MG PO TABS
30.00 | ORAL_TABLET | ORAL | Status: DC
Start: 2019-02-07 — End: 2019-02-07

## 2019-02-07 MED ORDER — EPOETIN ALFA-EPBX 10000 UNIT/ML IJ SOLN
10000.00 | INTRAMUSCULAR | Status: DC
Start: ? — End: 2019-02-07

## 2019-02-07 MED ORDER — METOPROLOL TARTRATE 25 MG PO TABS
6.25 | ORAL_TABLET | ORAL | Status: DC
Start: 2019-02-22 — End: 2019-02-07

## 2019-02-07 MED ORDER — ALBUMIN HUMAN 25 % IV SOLN
25.00 | INTRAVENOUS | Status: DC
Start: ? — End: 2019-02-07

## 2019-02-07 MED ORDER — TRAZODONE HCL 50 MG PO TABS
25.00 | ORAL_TABLET | ORAL | Status: DC
Start: 2019-02-22 — End: 2019-02-07

## 2019-02-07 MED ORDER — MIDODRINE HCL 10 MG PO TABS
10.00 | ORAL_TABLET | ORAL | Status: DC
Start: 2019-02-22 — End: 2019-02-07

## 2019-02-07 MED ORDER — ONDANSETRON HCL 4 MG/2ML IJ SOLN
4.00 | INTRAMUSCULAR | Status: DC
Start: ? — End: 2019-02-07

## 2019-02-07 MED ORDER — Medication
1.80 | Status: DC
Start: ? — End: 2019-02-07

## 2019-02-07 MED ORDER — GENERIC EXTERNAL MEDICATION
250.00 | Status: DC
Start: 2019-02-23 — End: 2019-02-07

## 2019-02-07 MED ORDER — MELATONIN 3 MG PO TABS
3.00 | ORAL_TABLET | ORAL | Status: DC
Start: 2019-02-22 — End: 2019-02-07

## 2019-02-07 MED ORDER — Medication
800.00 | Status: DC
Start: 2019-02-07 — End: 2019-02-07

## 2019-02-07 MED ORDER — FUROSEMIDE 40 MG PO TABS
40.00 | ORAL_TABLET | ORAL | Status: DC
Start: 2019-02-07 — End: 2019-02-07

## 2019-02-22 MED ORDER — GENERIC EXTERNAL MEDICATION
62.50 | Status: DC
Start: ? — End: 2019-02-22

## 2019-02-22 MED ORDER — SEVELAMER CARBONATE 800 MG PO TABS
800.00 | ORAL_TABLET | ORAL | Status: DC
Start: 2019-02-22 — End: 2019-02-22

## 2019-02-22 MED ORDER — HEPARIN SODIUM (PORCINE) 1000 UNIT/ML IJ SOLN
2000.00 | INTRAMUSCULAR | Status: DC
Start: ? — End: 2019-02-22

## 2019-02-22 MED ORDER — ASPIRIN 81 MG PO CHEW
81.00 | CHEWABLE_TABLET | ORAL | Status: DC
Start: 2019-02-23 — End: 2019-02-22

## 2019-02-22 MED ORDER — AMMONIUM LACTATE 12 % EX LOTN
1.00 | TOPICAL_LOTION | CUTANEOUS | Status: DC
Start: 2019-02-22 — End: 2019-02-22

## 2019-02-22 MED ORDER — APIXABAN 2.5 MG PO TABS
2.50 | ORAL_TABLET | ORAL | Status: DC
Start: 2019-02-22 — End: 2019-02-22

## 2019-02-22 MED ORDER — ALTEPLASE 2 MG IJ SOLR
2.00 | INTRAMUSCULAR | Status: DC
Start: ? — End: 2019-02-22

## 2019-02-22 MED ORDER — FUROSEMIDE 40 MG PO TABS
40.00 | ORAL_TABLET | ORAL | Status: DC
Start: 2019-02-24 — End: 2019-02-22

## 2019-02-22 MED ORDER — GENERIC EXTERNAL MEDICATION
45.00 | Status: DC
Start: 2019-02-22 — End: 2019-02-22

## 2019-06-29 ENCOUNTER — Telehealth: Payer: Self-pay

## 2019-06-29 NOTE — Telephone Encounter (Signed)
Copied from [place] [id number]. Topic: General - Inquiry >> Jun 29, 2019  3:53 PM Mathis Bud wrote: Reason for CRM: jennifer from [place] is stating patient was in [place] for over three months and was prescribed Protonix.  Pharmacy wanted to see if PCP can send a new presciption in for her.  Patient has appt with PCP on Thursday 10/22 PHARMACY call back [phone]

## 2019-07-02 NOTE — Telephone Encounter (Signed)
Are you ok with taking over protonix? She has an appt scheduled with you on 10/22.

## 2019-07-02 NOTE — Telephone Encounter (Signed)
If has been taking protonix regularly, then ok to refill.  Need to clarify dose and how many times per day taking, then ok to refill.

## 2019-07-04 NOTE — Telephone Encounter (Signed)
Patient would like to wait on filling protonix and discuss tomorrow at appt.

## 2019-07-05 ENCOUNTER — Other Ambulatory Visit: Payer: Self-pay

## 2019-07-05 ENCOUNTER — Ambulatory Visit (INDEPENDENT_AMBULATORY_CARE_PROVIDER_SITE_OTHER): Payer: 59 | Admitting: Internal Medicine

## 2019-07-05 DIAGNOSIS — E7800 Pure hypercholesterolemia, unspecified: Secondary | ICD-10-CM

## 2019-07-05 DIAGNOSIS — F439 Reaction to severe stress, unspecified: Secondary | ICD-10-CM

## 2019-07-05 DIAGNOSIS — I700 Atherosclerosis of aorta: Secondary | ICD-10-CM

## 2019-07-05 DIAGNOSIS — N019 Rapidly progressive nephritic syndrome with unspecified morphologic changes: Secondary | ICD-10-CM | POA: Diagnosis not present

## 2019-07-05 DIAGNOSIS — G7289 Other specified myopathies: Secondary | ICD-10-CM

## 2019-07-05 NOTE — Progress Notes (Signed)
Patient ID: Leah Espinoza, female   DOB: 1956/09/10, 63 y.o.   MRN: [id number]   Virtual Visit via video Note  This visit type was conducted due to national recommendations for restrictions regarding the COVID-19 pandemic (e.g. social distancing).  This format is felt to be most appropriate for this patient at this time.  All issues noted in this document were discussed and addressed.  No physical exam was performed (except for noted visual exam findings with Video Visits).   I connected with [facility] by a video enabled telemedicine application and verified that I am speaking with the correct person using two identifiers. Location patient: home Location provider: work  Persons participating in the virtual visit: patient, provider  I discussed the limitations, risks, security and privacy concerns of performing an evaluation and management service by video and the availability of in person appointments. The patient expressed understanding and agreed to proceed.   Reason for visit: hospital/rehab follow up.   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  Treated with IVIG.  Off hemodialysis now.  PEG tube removed two days ago.  Was in rehab and came home last week.  Still with some decreased appetite, but is eating.  She is able to feed herself.  Cannot raise her arms over her head.  She is able to move her legs some.  She had PT and OT working with her.  Has home health in place.  Planning to have f/u labs next week.  States her arm, legs and feet are hypersensitive.  Blood pressures doing well - averaging 120/60s.  Has f/u with rheumatology 07/2019.  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  Has good support.  Handling stress.      ROS: See pertinent positives and negatives per HPI.  Past Medical History:  Diagnosis Date  . Family history of adverse reaction to anesthesia    Sister - PONV  . History of chicken pox   . History of fainting   . Hyperlipidemia   . Hypertension   . MI (myocardial infarction) ([place])   . Motion sickness    circular motion  . Vertigo    no episodes for several months    Past Surgical History:  Procedure Laterality Date  . CARDIAC CATHETERIZATION N/A 12/23/2015   Procedure: Left Heart Cath and Coronary Angiography;  Surgeon: Isaias Cowman, MD;  Location: [place] [facility];  Service: Cardiovascular;  Laterality: N/A;  . CARDIAC CATHETERIZATION N/A 12/23/2015   Procedure: Coronary Stent Intervention;  Surgeon: Isaias Cowman, MD;  Location: [place] [facility];  Service: Cardiovascular;  Laterality: N/A;  . CATARACT EXTRACTION W/PHACO Right 07/27/2017   Procedure: CATARACT EXTRACTION PHACO AND INTRAOCULAR LENS PLACEMENT ([place]) RIGHT SYMFONY LENS;  Surgeon: Leandrew Koyanagi, MD;  Location: [place];  Service: Ophthalmology;  Laterality: Right;  . CATARACT EXTRACTION W/PHACO Left 08/10/2017   Procedure: CATARACT EXTRACTION PHACO AND INTRAOCULAR LENS PLACEMENT ([place]) LEFT SYMFONY TORIC LENS;  Surgeon: Leandrew Koyanagi, MD;  Location: [place];  Service: Ophthalmology;  Laterality: Left;  . COLONOSCOPY      Family History  Problem Relation Age of Onset  . Hyperlipidemia Mother   . Heart disease Mother   . Cancer Father        lung  . Sudden  death Brother   . Arthritis Maternal Grandmother     SOCIAL HX: reviewed.    Current Outpatient Medications:  .  acetaminophen (TYLENOL) 325 MG tablet, Take 2 tablets (650 mg total) by mouth every 6 (six) hours as needed for mild pain or moderate pain., Disp: 10 tablet, Rfl: 0 .  atorvastatin (LIPITOR) 10 MG tablet, Take 10 mg by mouth daily. , Disp: , Rfl:  .  Cholecalciferol (VITAMIN D-1000 MAX ST) 25  MCG (1000 UT) tablet, Take 1,000 Units by mouth daily. , Disp: , Rfl:  .  Digestive Enzymes (ENZYME DIGEST) CAPS, Take 1 capsule by mouth daily. , Disp: , Rfl:  .  ELIQUIS 2.5 MG TABS tablet, Take 2.5 mg by mouth 2 (two) times daily., Disp: , Rfl:  .  hydroxychloroquine (PLAQUENIL) 200 MG tablet, Take 200 mg by mouth daily. , Disp: , Rfl:  .  insulin regular (HUMULIN R) 100 units/mL injection, Inject 100 Units/mL as directed. Inject per sliding scale: 70-150 = 0 151-200 = 2 u 201-250 = 4 u 251- 300 = 6 u 301- 350 = 8 u 351-400 = 10 u If above 400 give 12 u and call MD, Disp: , Rfl:  .  Melatonin 3 MG TABS, Take 3 mg by mouth every evening., Disp: , Rfl:  .  mirtazapine (REMERON) 30 MG tablet, Take 30 mg by mouth daily., Disp: , Rfl:  .  mycophenolate (CELLCEPT) 500 MG tablet, , Disp: , Rfl:  .  pantoprazole sodium (PROTONIX) 40 mg/20 mL PACK, Take 40 mg by mouth daily., Disp: , Rfl:  .  potassium chloride (KLOR-CON) 20 MEQ packet, Take 20 mEq by mouth daily., Disp: , Rfl:  .  sevelamer carbonate (RENVELA) 800 MG tablet, Take 800 mg by mouth at bedtime., Disp: , Rfl:   EXAM:  GENERAL: alert, oriented, appears well and in no acute distress  HEENT: atraumatic, conjunttiva clear, no obvious abnormalities on inspection of external nose and ears  NECK: normal movements of the head and neck  LUNGS: on inspection no signs of respiratory distress, breathing rate appears normal, no obvious gross SOB, gasping or wheezing  CV: no obvious cyanosis  PSYCH/NEURO: pleasant and cooperative, no obvious depression or anxiety, speech and thought processing grossly intact  ASSESSMENT AND PLAN:  Discussed the following assessment and plan:  Aortic atherosclerosis (HCC) On lipitor.   Hypercholesterolemia On lipitor.  Follow lipid panel and liver function tests.  Necrotizing myopathy Treated with IVIG.  Able to move her legs some now.  Able to feed herself. Working with therapy.  Continue f/u with  rheumatology and nephrology.   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  Off dialysis now.  Following up with nephrology.    Stress Increased stress with medical issues, etc.  Has good support.  Does not feel needs anything more at this time.  Follow.      I discussed the assessment and treatment plan with the patient. The patient was provided an opportunity to ask questions and all were answered. The patient agreed with the plan and demonstrated an understanding of the instructions.   The patient was advised to call back or seek an in-person evaluation if the symptoms worsen or if the condition fails to improve as anticipated.   Einar Pheasant, MD

## 2019-07-08 ENCOUNTER — Encounter: Payer: Self-pay | Admitting: Internal Medicine

## 2019-07-08 DIAGNOSIS — G7289 Other specified myopathies: Secondary | ICD-10-CM | POA: Insufficient documentation

## 2019-07-08 DIAGNOSIS — N019 Rapidly progressive nephritic syndrome with unspecified morphologic changes: Secondary | ICD-10-CM | POA: Insufficient documentation

## 2019-07-08 NOTE — Assessment & Plan Note (Signed)
On lipitor.  Follow lipid panel and liver function tests.   

## 2019-07-08 NOTE — Assessment & Plan Note (Signed)
On lipitor

## 2019-07-08 NOTE — Assessment & Plan Note (Signed)
Increased stress with medical issues, etc.  Has good support.  Does not feel needs anything more at this time.  Follow.

## 2019-07-08 NOTE — Assessment & Plan Note (Signed)
Treated with IVIG.  Able to move her legs some now.  Able to feed herself. Working with therapy.  Continue f/u with rheumatology and nephrology.

## 2019-07-08 NOTE — Assessment & Plan Note (Signed)
Previously on hemodialysis.  Off dialysis now.  Following up with nephrology.

## 2019-07-11 ENCOUNTER — Telehealth: Payer: Self-pay

## 2019-07-11 NOTE — Telephone Encounter (Signed)
Spoke with pt and informed her of below. Pt gave verbal understanding. Pt did say that nephrology gave her the ok to get flu shot. She said that during her visit, we had mentioned home health being able to come out to give the flu shot? I can call home health if needed but wasn't sure what was discussed.

## 2019-07-11 NOTE — Telephone Encounter (Signed)
Were you wanting to do labs prior to prescribing potassium? Are you okay to take over filling her midodrine? I do not have that on her med list so I will have to clarify dose.

## 2019-07-11 NOTE — Telephone Encounter (Signed)
Copied from [place] [id number]. Topic: General - Inquiry >> Jul 11, 2019  9:03 AM Richardo Priest, NT wrote: Reason for CRM: Patient's pharmacy called in stating that patient's daughter has been calling them daily checking on two prescriptions she believed PCP would be filling for patient. Patient was prescribed midodrine and potassium while in rehabilitation center and was not given any when released to her home. Hospital center advised her that PCP would be taking over scripts. Please advise and call daughter to clarify.

## 2019-07-11 NOTE — Telephone Encounter (Signed)
Leah Espinoza had informed me she was seeing nephrology this week and that they were drawing labs.  They were going to f/u on her kidney function and potassium.  Per Dr Darin Engels note - potassium is being stopped.  I called Dr Darin Engels office and spoke to Varn[place]. She confirmed - potassium is being stopped.  I questioned the midrodine. They did not have this listed on her medication list.  Pulled up discharge summary from rehab. Was on bid.  Altha Harm is going to discuss with Dr Ralph Dowdy if midrodrine is needed.  They will call pt and let her know and will send in refill if needed.  Let me know if any questions.

## 2019-07-11 NOTE — Telephone Encounter (Signed)
If they have given her the ok, I am ok with flu vaccine.  Can see if home health can given since she is unable to come out.

## 2019-07-23 NOTE — Telephone Encounter (Signed)
Called home health. They are unable to give the flu vaccine. Patient is going to have pharmacy or her niece who is a nurse give it to her. Advised to let us know if we need to do anything.

## 2019-07-24 ENCOUNTER — Other Ambulatory Visit: Payer: Self-pay | Admitting: Internal Medicine

## 2019-07-26 NOTE — Telephone Encounter (Signed)
This medication should be refilled by her rheumatologist or nephrologist.  (probably rheumatology has been refilling).  Let us know if a problem.

## 2019-07-26 NOTE — Telephone Encounter (Signed)
Request sent to rheumatology. Patient aware.

## 2019-07-30 ENCOUNTER — Telehealth: Payer: Self-pay | Admitting: Internal Medicine

## 2019-07-30 NOTE — Telephone Encounter (Signed)
Copied from [place] [id number]. Topic: Quick Communication - Home Health Verbal Orders >> Jul 30, 2019  4:05 PM Jodie Echevaria wrote: Caller/Agency: [name] / [place] Number: [phone] ok to LM  Requesting OT/PT/Skilled Nursing/Social Work/Speech Therapy: OT  Frequency: 1 x week 8 wks

## 2019-07-31 NOTE — Telephone Encounter (Signed)
Verbal order given as written below.

## 2019-08-03 ENCOUNTER — Telehealth: Payer: Self-pay | Admitting: Internal Medicine

## 2019-08-06 ENCOUNTER — Other Ambulatory Visit: Payer: Self-pay

## 2019-08-06 NOTE — Telephone Encounter (Signed)
She is being followed by rheumatology and nephrology.  Per nephrology's note, they had metoprolol listed on her med list.  Also, they had sevelemar carbonate - per their note - they are refilling the medication.  I am ok to refill eliquis.  She should still be following with cardiology and they have been refilling the eliquis previously, but I am ok to refill eliquis.  Confirm she has been taking remeron regularly and I will be ok to refill this as well

## 2019-08-08 ENCOUNTER — Other Ambulatory Visit: Payer: Self-pay | Admitting: Internal Medicine

## 2019-08-08 DIAGNOSIS — E1122 Type 2 diabetes mellitus with diabetic chronic kidney disease: Secondary | ICD-10-CM

## 2019-08-08 DIAGNOSIS — M6282 Rhabdomyolysis: Secondary | ICD-10-CM

## 2019-08-08 DIAGNOSIS — G729 Myopathy, unspecified: Secondary | ICD-10-CM | POA: Diagnosis not present

## 2019-08-08 DIAGNOSIS — I252 Old myocardial infarction: Secondary | ICD-10-CM

## 2019-08-08 DIAGNOSIS — N017 Rapidly progressive nephritic syndrome with diffuse crescentic glomerulonephritis: Secondary | ICD-10-CM

## 2019-08-08 DIAGNOSIS — K219 Gastro-esophageal reflux disease without esophagitis: Secondary | ICD-10-CM

## 2019-08-08 DIAGNOSIS — M6249 Contracture of muscle, multiple sites: Secondary | ICD-10-CM

## 2019-08-08 DIAGNOSIS — Z4800 Encounter for change or removal of nonsurgical wound dressing: Secondary | ICD-10-CM

## 2019-08-08 DIAGNOSIS — I4891 Unspecified atrial fibrillation: Secondary | ICD-10-CM

## 2019-08-08 DIAGNOSIS — I120 Hypertensive chronic kidney disease with stage 5 chronic kidney disease or end stage renal disease: Secondary | ICD-10-CM

## 2019-08-08 DIAGNOSIS — Z87440 Personal history of urinary (tract) infections: Secondary | ICD-10-CM

## 2019-08-08 DIAGNOSIS — I2510 Atherosclerotic heart disease of native coronary artery without angina pectoris: Secondary | ICD-10-CM

## 2019-08-08 DIAGNOSIS — N319 Neuromuscular dysfunction of bladder, unspecified: Secondary | ICD-10-CM

## 2019-08-08 DIAGNOSIS — Z7901 Long term (current) use of anticoagulants: Secondary | ICD-10-CM

## 2019-08-08 DIAGNOSIS — N186 End stage renal disease: Secondary | ICD-10-CM

## 2019-08-08 DIAGNOSIS — Z794 Long term (current) use of insulin: Secondary | ICD-10-CM

## 2019-08-08 DIAGNOSIS — R532 Functional quadriplegia: Secondary | ICD-10-CM | POA: Diagnosis not present

## 2019-08-08 DIAGNOSIS — Z9181 History of falling: Secondary | ICD-10-CM

## 2019-08-08 DIAGNOSIS — R339 Retention of urine, unspecified: Secondary | ICD-10-CM

## 2019-08-08 NOTE — Telephone Encounter (Signed)
Clarified meds. Sent in refills

## 2019-08-08 NOTE — Telephone Encounter (Signed)
Returning a call to [place] regarding medications.  Please call back at [phone]

## 2019-08-20 ENCOUNTER — Telehealth: Payer: Self-pay

## 2019-08-20 NOTE — Telephone Encounter (Signed)
See other phone note. This note should be closed.

## 2019-08-20 NOTE — Telephone Encounter (Signed)
Patients daughter is calling wanting to know if her cholesterol medication can be switched from a statin to something different. Per daughter, rheumatology is requesting this due to a muscle condition that she has developed. She is due for a follow up with you the end of this month, do you want to send in something different and then follow up with her in a few weeks or schedule her to see you sooner?

## 2019-08-20 NOTE — Telephone Encounter (Signed)
Pts daughter called to check on the medication change for pt. Please advise.

## 2019-08-20 NOTE — Telephone Encounter (Signed)
Per our discussion, pt off her cholesterol medication.  She last had cholesterol checked 04/2017.  Can remain off and we will recheck labs and then will determine treatment.  See if home health can draw.

## 2019-08-21 NOTE — Telephone Encounter (Signed)
Left message for Traci, her daughter, to call back.

## 2019-08-24 ENCOUNTER — Other Ambulatory Visit: Payer: Self-pay

## 2019-08-24 NOTE — Telephone Encounter (Signed)
Advised that we are going to hold on cholesterol medication until labs are checked.

## 2019-08-24 NOTE — Telephone Encounter (Signed)
Called to schedule follow up appt and pt had a question about her Cholesterol medication

## 2019-09-05 DIAGNOSIS — Z789 Other specified health status: Secondary | ICD-10-CM | POA: Insufficient documentation

## 2019-09-18 ENCOUNTER — Telehealth: Payer: Self-pay | Admitting: Internal Medicine

## 2019-09-18 NOTE — Telephone Encounter (Signed)
Aldrin with [facility] [phone] called needing verbal orders for Physical therapy  2x week for 3wks then 1 week for 3 weeks

## 2019-09-18 NOTE — Telephone Encounter (Signed)
Spoke with Aldrin, PT, and was given the number to order labs prior to her visit on 1/18. Do you just want to order routine fasting labs? If so, I can write out rx and fax.

## 2019-09-18 NOTE — Telephone Encounter (Signed)
Verbals given  

## 2019-09-19 NOTE — Telephone Encounter (Signed)
Signed and placed in box.   

## 2019-09-19 NOTE — Telephone Encounter (Signed)
Script written for you to sign.

## 2019-09-19 NOTE — Telephone Encounter (Signed)
Needs cbc, ferritin, B12 (dx anemia) - met b (dx CKD) - lipid panel and liver function tests (dx hypercholesterolemia) and tsh (dx hypercholesterolemia).

## 2019-09-21 NOTE — Telephone Encounter (Signed)
Faxed to [facility]

## 2019-10-01 ENCOUNTER — Ambulatory Visit (INDEPENDENT_AMBULATORY_CARE_PROVIDER_SITE_OTHER): Payer: 59 | Admitting: Internal Medicine

## 2019-10-01 ENCOUNTER — Other Ambulatory Visit: Payer: Self-pay

## 2019-10-01 DIAGNOSIS — I252 Old myocardial infarction: Secondary | ICD-10-CM | POA: Diagnosis not present

## 2019-10-01 DIAGNOSIS — G7289 Other specified myopathies: Secondary | ICD-10-CM

## 2019-10-01 DIAGNOSIS — I700 Atherosclerosis of aorta: Secondary | ICD-10-CM

## 2019-10-01 DIAGNOSIS — E7800 Pure hypercholesterolemia, unspecified: Secondary | ICD-10-CM

## 2019-10-01 DIAGNOSIS — F439 Reaction to severe stress, unspecified: Secondary | ICD-10-CM

## 2019-10-01 NOTE — Progress Notes (Signed)
Patient ID: Leah Espinoza, female   DOB: 05-24-1956, 64 y.o.   MRN: [id number]   Virtual Visit via video Note  This visit type was conducted due to national recommendations for restrictions regarding the COVID-19 pandemic (e.g. social distancing).  This format is felt to be most appropriate for this patient at this time.  All issues noted in this document were discussed and addressed.  No physical exam was performed (except for noted visual exam findings with Video Visits).   I connected with [facility] by a video enabled telemedicine application and verified that I am speaking with the correct person using two identifiers. Location patient: home Location provider: work Persons participating in the virtual visit: patient, provider  The limitations, risks, security and privacy concerns of performing an evaluation and management service by video and the availability of in person appointments have been discussed.   The patient expressed understanding and agreed to proceed.  Reason for visit: scheduled follow up.    HPI: She reports she is doing relatively well.  Increased stress with her medical issues, covid, etc.  Has good family support.  Overall handling things relatively well. Making some gradual improvements.  Has motorized wheelchair.  She can use her arms more.  Still weak.  Feels is getting a little bit stronger.  She can feed herself.  No chest pain.  Breathing stable.  No increased cough or congestion.  No abdominal pain.  Bowels moving.  Blood pressure checks:  120s/60-70s.  Off cholesterol medication - per rheumatology recommendation.  Still working with therapy.     ROS: See pertinent positives and negatives per HPI.  Past Medical History:  Diagnosis Date  . Family history of adverse reaction to anesthesia    Sister - PONV  . History of chicken pox   . History of fainting   . Hyperlipidemia   . Hypertension   . MI (myocardial infarction) ([place])   . Motion sickness    circular motion  . Vertigo    no episodes for several months    Past Surgical History:  Procedure Laterality Date  . CARDIAC CATHETERIZATION N/A 12/23/2015   Procedure: Left Heart Cath and Coronary Angiography;  Surgeon: Isaias Cowman, MD;  Location: [place] [facility];  Service: Cardiovascular;  Laterality: N/A;  . CARDIAC CATHETERIZATION N/A 12/23/2015   Procedure: Coronary Stent Intervention;  Surgeon: Isaias Cowman, MD;  Location: [place] [facility];  Service: Cardiovascular;  Laterality: N/A;  . CATARACT EXTRACTION W/PHACO Right 07/27/2017   Procedure: CATARACT EXTRACTION PHACO AND INTRAOCULAR LENS PLACEMENT ([place]) RIGHT SYMFONY LENS;  Surgeon: Leandrew Koyanagi, MD;  Location: [place];  Service: Ophthalmology;  Laterality: Right;  . CATARACT EXTRACTION W/PHACO Left 08/10/2017   Procedure: CATARACT EXTRACTION PHACO AND INTRAOCULAR LENS PLACEMENT ([place]) LEFT SYMFONY TORIC LENS;  Surgeon: Leandrew Koyanagi, MD;  Location: [place];  Service: Ophthalmology;  Laterality: Left;  . COLONOSCOPY      Family History  Problem Relation Age of Onset  . Hyperlipidemia Mother   . Heart disease Mother   . Cancer Father        lung  . Sudden death Brother   . Arthritis Maternal Grandmother     SOCIAL HX: reviewed.    Current Outpatient Medications:  .  acetaminophen (TYLENOL) 325 MG tablet, Take 2 tablets (650 mg total) by mouth every 6 (six) hours as needed for mild pain or moderate pain., Disp: 10 tablet, Rfl: 0 .  Cholecalciferol (VITAMIN D-1000 MAX ST) 25  MCG (1000 UT) tablet, Take 1,000 Units by mouth daily. , Disp: , Rfl:  .  Digestive Enzymes (ENZYME DIGEST) CAPS, Take 1 capsule by mouth daily. , Disp: , Rfl:  .  ELIQUIS 2.5 MG TABS tablet, TAKE 1 TABLET VIA PEG TUBE 2 TIMES DAILYFOR AFIB, Disp: 60 tablet, Rfl: 2 .  hydroxychloroquine (PLAQUENIL) 200 MG tablet, Take 200 mg by mouth daily. , Disp: , Rfl:  .  Melatonin 3 MG TABS, Take 3 mg by  mouth every evening., Disp: , Rfl:  .  metoprolol tartrate (LOPRESSOR) 25 MG tablet, TAKE 1/4 TABLET VIA G TUBE 2 TIMES DAILYFOR AFIB, Disp: 15 tablet, Rfl: 2 .  mirtazapine (REMERON) 30 MG tablet, TAKE 1 TABLET VIA G TUBE AT BEDTIME FOR APPETITE STIMULANT, Disp: 30 tablet, Rfl: 5 .  mycophenolate (CELLCEPT) 500 MG tablet, , Disp: , Rfl:  .  pantoprazole sodium (PROTONIX) 40 mg/20 mL PACK, Take 40 mg by mouth daily., Disp: , Rfl:  .  potassium chloride (KLOR-CON) 20 MEQ packet, Take 20 mEq by mouth daily., Disp: , Rfl:  .  sevelamer carbonate (RENVELA) 800 MG tablet, Take 800 mg by mouth at bedtime., Disp: , Rfl:   EXAM:  VITALS per patient if applicable: [id number]  GENERAL: alert, oriented, appears well and in no acute distress  HEENT: atraumatic, conjunttiva clear, no obvious abnormalities on inspection of external nose and ears  NECK: normal movements of the head and neck  LUNGS: on inspection no signs of respiratory distress, breathing rate appears normal, no obvious gross SOB, gasping or wheezing  CV: no obvious cyanosis  PSYCH/NEURO: pleasant and cooperative, no obvious depression or anxiety, speech and thought processing grossly intact  ASSESSMENT AND PLAN:  Discussed the following assessment and plan:  Aortic atherosclerosis ([place]) Off lipitor now per specialist request.  Follow.   History of ST elevation myocardial infarction (STEMI) Followed by cardiology.  Continue risk factor modification.  No chest pain.  Breathing stable.  Follow.   Hypercholesterolemia Off lipitor now.  Low cholesterol diet and exercise.  Follow lipid panel.    Necrotizing myopathy Treated with IVIG.  Gradually improving - feels a little stronger.  Continue therapy.   Stress Increased stress.  Discussed with her today.  Has good support.  Does not feel needs anything more at this time.  Follow.      I discussed the assessment and treatment plan with the patient. The patient was provided an  opportunity to ask questions and all were answered. The patient agreed with the plan and demonstrated an understanding of the instructions.   The patient was advised to call back or seek an in-person evaluation if the symptoms worsen or if the condition fails to improve as anticipated.   Einar Pheasant, MD

## 2019-10-04 DIAGNOSIS — K219 Gastro-esophageal reflux disease without esophagitis: Secondary | ICD-10-CM

## 2019-10-04 DIAGNOSIS — Z993 Dependence on wheelchair: Secondary | ICD-10-CM

## 2019-10-04 DIAGNOSIS — Z7901 Long term (current) use of anticoagulants: Secondary | ICD-10-CM

## 2019-10-04 DIAGNOSIS — I4891 Unspecified atrial fibrillation: Secondary | ICD-10-CM

## 2019-10-04 DIAGNOSIS — Z794 Long term (current) use of insulin: Secondary | ICD-10-CM

## 2019-10-04 DIAGNOSIS — M6249 Contracture of muscle, multiple sites: Secondary | ICD-10-CM

## 2019-10-04 DIAGNOSIS — G729 Myopathy, unspecified: Secondary | ICD-10-CM

## 2019-10-04 DIAGNOSIS — M6282 Rhabdomyolysis: Secondary | ICD-10-CM

## 2019-10-04 DIAGNOSIS — I252 Old myocardial infarction: Secondary | ICD-10-CM

## 2019-10-04 DIAGNOSIS — R532 Functional quadriplegia: Secondary | ICD-10-CM | POA: Diagnosis not present

## 2019-10-04 DIAGNOSIS — N017 Rapidly progressive nephritic syndrome with diffuse crescentic glomerulonephritis: Secondary | ICD-10-CM

## 2019-10-04 DIAGNOSIS — N319 Neuromuscular dysfunction of bladder, unspecified: Secondary | ICD-10-CM

## 2019-10-04 DIAGNOSIS — Z87440 Personal history of urinary (tract) infections: Secondary | ICD-10-CM

## 2019-10-04 DIAGNOSIS — Z992 Dependence on renal dialysis: Secondary | ICD-10-CM

## 2019-10-04 DIAGNOSIS — Z4800 Encounter for change or removal of nonsurgical wound dressing: Secondary | ICD-10-CM

## 2019-10-04 DIAGNOSIS — I120 Hypertensive chronic kidney disease with stage 5 chronic kidney disease or end stage renal disease: Secondary | ICD-10-CM

## 2019-10-04 DIAGNOSIS — N186 End stage renal disease: Secondary | ICD-10-CM

## 2019-10-04 DIAGNOSIS — I2510 Atherosclerotic heart disease of native coronary artery without angina pectoris: Secondary | ICD-10-CM

## 2019-10-04 DIAGNOSIS — R339 Retention of urine, unspecified: Secondary | ICD-10-CM

## 2019-10-04 DIAGNOSIS — E1122 Type 2 diabetes mellitus with diabetic chronic kidney disease: Secondary | ICD-10-CM

## 2019-10-04 DIAGNOSIS — Z9181 History of falling: Secondary | ICD-10-CM

## 2019-10-07 ENCOUNTER — Encounter: Payer: Self-pay | Admitting: Internal Medicine

## 2019-10-07 NOTE — Assessment & Plan Note (Signed)
Off lipitor now per specialist request.  Follow.

## 2019-10-07 NOTE — Assessment & Plan Note (Signed)
Treated with IVIG.  Gradually improving - feels a little stronger.  Continue therapy.

## 2019-10-07 NOTE — Assessment & Plan Note (Signed)
Followed by cardiology.  Continue risk factor modification.  No chest pain.  Breathing stable.  Follow.

## 2019-10-07 NOTE — Assessment & Plan Note (Signed)
Off lipitor now.  Low cholesterol diet and exercise.  Follow lipid panel.

## 2019-10-07 NOTE — Assessment & Plan Note (Signed)
Increased stress.  Discussed with her today.  Has good support.  Does not feel needs anything more at this time.  Follow.

## 2019-10-17 ENCOUNTER — Telehealth: Payer: Self-pay | Admitting: Internal Medicine

## 2019-10-17 NOTE — Telephone Encounter (Signed)
[name] from [facility] said pt told her that Dr. Nicki Reaper said she needed to get labs from their nurse. She is requesting that Dr. Nicki Reaper send the orders to them for the nurse to be able to draw labs.

## 2019-10-19 NOTE — Telephone Encounter (Signed)
Attempted to reach 1/800 number for [facility]. Unable to reach

## 2019-10-23 ENCOUNTER — Telehealth: Payer: Self-pay | Admitting: Internal Medicine

## 2019-10-23 NOTE — Telephone Encounter (Signed)
Called patient to confirm doing ok. Schedule for virtual visit tomorrow at 11:30

## 2019-10-23 NOTE — Telephone Encounter (Signed)
Pt's caregive called with patient in the room and said that pt has a place on her toe that might be an ingrown toe nail. It is bleeding and has drainage from the nailbed. It is puffy and red. She would like for patient to be seen as soon as possible for a virtual visit. She thinks the pt's toe might be infected.

## 2019-10-24 ENCOUNTER — Ambulatory Visit (INDEPENDENT_AMBULATORY_CARE_PROVIDER_SITE_OTHER): Payer: 59 | Admitting: Internal Medicine

## 2019-10-24 ENCOUNTER — Other Ambulatory Visit: Payer: Self-pay

## 2019-10-24 ENCOUNTER — Encounter: Payer: Self-pay | Admitting: Internal Medicine

## 2019-10-24 ENCOUNTER — Telehealth: Payer: Self-pay | Admitting: Internal Medicine

## 2019-10-24 ENCOUNTER — Telehealth: Payer: Self-pay

## 2019-10-24 DIAGNOSIS — M79674 Pain in right toe(s): Secondary | ICD-10-CM

## 2019-10-24 DIAGNOSIS — N019 Rapidly progressive nephritic syndrome with unspecified morphologic changes: Secondary | ICD-10-CM

## 2019-10-24 MED ORDER — MUPIROCIN 2 % EX OINT: TOPICAL_OINTMENT | CUTANEOUS | 0 refills | Status: DC

## 2019-10-24 NOTE — Progress Notes (Signed)
Patient ID: Leah Espinoza, female   DOB: 09/10/1956, 64 y.o.   MRN: [id number]   Virtual Visit via video Note  This visit type was conducted due to national recommendations for restrictions regarding the COVID-19 pandemic (e.g. social distancing).  This format is felt to be most appropriate for this patient at this time.  All issues noted in this document were discussed and addressed.  No physical exam was performed (except for noted visual exam findings with Video Visits).   I connected with [facility] by a video enabled telemedicine application and verified that I am speaking with the correct person using two identifiers. Location patient: home Location provider: work Persons participating in the virtual visit: patient, provider  I discussed the limitations, risks, security and privacy concerns of performing an evaluation and management service by telephone and the availability of in person appointments. The patient expressed understanding and agreed to proceed.   Reason for visit: work in appt.   HPI: She is accompanied by caretaker.  History obtained from both of them.  Remains cellcept and plaquenil.  Followed by nephrology and rheumatology.  Requested labs.  Have discussed with home health.  Trying to get arranged.  Work in appt for toe lesion.  States that starting two days ago, noticed an area on the right great toe - some blood.  Tender to touch.  Soaked in epsom salts.  Still some soreness and some minimal soft tissue swelling.  No further bleeding.  No redness extending up foot.  No fever.  Feels otherwise stable.     ROS: See pertinent positives and negatives per HPI.  Past Medical History:  Diagnosis Date  . Family history of adverse reaction to anesthesia    Sister - PONV  . History of chicken pox   . History of fainting   . Hyperlipidemia   . Hypertension   . MI (myocardial infarction) ([place])   . Motion sickness    circular motion  . Vertigo    no episodes for  several months    Past Surgical History:  Procedure Laterality Date  . CARDIAC CATHETERIZATION N/A 12/23/2015   Procedure: Left Heart Cath and Coronary Angiography;  Surgeon: Isaias Cowman, MD;  Location: [place] [facility];  Service: Cardiovascular;  Laterality: N/A;  . CARDIAC CATHETERIZATION N/A 12/23/2015   Procedure: Coronary Stent Intervention;  Surgeon: Isaias Cowman, MD;  Location: [place] [facility];  Service: Cardiovascular;  Laterality: N/A;  . CATARACT EXTRACTION W/PHACO Right 07/27/2017   Procedure: CATARACT EXTRACTION PHACO AND INTRAOCULAR LENS PLACEMENT ([place]) RIGHT SYMFONY LENS;  Surgeon: Leandrew Koyanagi, MD;  Location: [facility];  Service: Ophthalmology;  Laterality: Right;  . CATARACT EXTRACTION W/PHACO Left 08/10/2017   Procedure: CATARACT EXTRACTION PHACO AND INTRAOCULAR LENS PLACEMENT ([place]) LEFT SYMFONY TORIC LENS;  Surgeon: Leandrew Koyanagi, MD;  Location: [facility];  Service: Ophthalmology;  Laterality: Left;  . COLONOSCOPY      Family History  Problem Relation Age of Onset  . Hyperlipidemia Mother   . Heart disease Mother   . Cancer Father        lung  . Sudden death Brother   . Arthritis Maternal Grandmother     SOCIAL HX: reviewed.    Current Outpatient Medications:  .  acetaminophen (TYLENOL) 325 MG tablet, Take 2 tablets (650 mg total) by mouth every 6 (six) hours as needed for mild pain or moderate pain., Disp: 10 tablet, Rfl: 0 .  Cholecalciferol (VITAMIN D-1000 MAX ST) 25 MCG (  1000 UT) tablet, Take 1,000 Units by mouth daily. , Disp: , Rfl:  .  Digestive Enzymes (ENZYME DIGEST) CAPS, Take 1 capsule by mouth daily. , Disp: , Rfl:  .  ELIQUIS 2.5 MG TABS tablet, TAKE 1 TABLET VIA PEG TUBE 2 TIMES DAILYFOR AFIB, Disp: 60 tablet, Rfl: 2 .  hydroxychloroquine (PLAQUENIL) 200 MG tablet, Take 200 mg by mouth daily. , Disp: , Rfl:  .  Melatonin 3 MG TABS, Take 3 mg by mouth every evening., Disp: , Rfl:  .  metoprolol  tartrate (LOPRESSOR) 25 MG tablet, TAKE 1/4 TABLET VIA G TUBE 2 TIMES DAILYFOR AFIB, Disp: 15 tablet, Rfl: 2 .  mirtazapine (REMERON) 30 MG tablet, TAKE 1 TABLET VIA G TUBE AT BEDTIME FOR APPETITE STIMULANT, Disp: 30 tablet, Rfl: 5 .  mupirocin ointment (BACTROBAN) 2 %, Apply to affected area on toe bid, Disp: 22 g, Rfl: 0 .  mycophenolate (CELLCEPT) 500 MG tablet, , Disp: , Rfl:  .  pantoprazole sodium (PROTONIX) 40 mg/20 mL PACK, Take 40 mg by mouth daily., Disp: , Rfl:  .  potassium chloride (KLOR-CON) 20 MEQ packet, Take 20 mEq by mouth daily., Disp: , Rfl:  .  sevelamer carbonate (RENVELA) 800 MG tablet, Take 800 mg by mouth at bedtime., Disp: , Rfl:   EXAM:  GENERAL: alert, oriented, appears well and in no acute distress  HEENT: atraumatic, conjunttiva clear, no obvious abnormalities on inspection of external nose and ears  NECK: normal movements of the head and neck  LUNGS: on inspection no signs of respiratory distress, breathing rate appears normal, no obvious gross SOB, gasping or wheezing  CV: no obvious cyanosis  MS: moves all visible extremities without noticeable abnormality.  Right great toe  - minimal soft tissue swelling - distal right great toe.  Open area with dried blood.  Increased pain to palpation .  No redness extending up toe.    PSYCH/NEURO: pleasant and cooperative, no obvious depression or anxiety, speech and thought processing grossly intact  ASSESSMENT AND PLAN:  Discussed the following assessment and plan:  Great toe pain Right great toe pain with open skin, dried blood and minimal pain to palpation.  No redness extending up toe.  Treat with bactroban ointment as directed.  Continue epsom salt soaks.  Follow closely.  Call with update.    Pauci-immune RPGN (rapidly progressive glomerulonephritis) Due labs.  Have home health draw.     Meds ordered this encounter  Medications  . mupirocin ointment (BACTROBAN) 2 %    Sig: Apply to affected area on toe  bid    Dispense:  22 g    Refill:  0     I discussed the assessment and treatment plan with the patient. The patient was provided an opportunity to ask questions and all were answered. The patient agreed with the plan and demonstrated an understanding of the instructions.   The patient was advised to call back or seek an in-person evaluation if the symptoms worsen or if the condition fails to improve as anticipated.   Einar Pheasant, MD

## 2019-10-24 NOTE — Telephone Encounter (Signed)
[facility] called and they are unable to do aldolase lab that was ordered. She thought that maybe that was a part of the liver panel or Ck lab. She will draw all others. She said that particular lab was not one that she could find to on her end to draw. Any advise on this?

## 2019-10-24 NOTE — Telephone Encounter (Signed)
Physical therapist with [facility] called to speak to Leah Espinoza. His name is Aldrin and his number is [phone]

## 2019-10-24 NOTE — Telephone Encounter (Signed)
Unable to reach anyone by phone. Script written. Will have Dr Derrel Nip sign today so I can fax lab orders this PM.

## 2019-10-24 NOTE — Telephone Encounter (Signed)
lmtcb

## 2019-10-24 NOTE — Telephone Encounter (Signed)
Left message for home health again.Need to add urinalysis (dx N01.9), CK (dx: G72.89), Aldolase (dx G72.89)

## 2019-10-24 NOTE — Telephone Encounter (Signed)
Spoke with Butch Penny at [facility]. Confirmed lab orders have been received.

## 2019-10-24 NOTE — Telephone Encounter (Signed)
Got number from pt ([phone]). Called and left message for nurse at [facility]. Patient needs cbc, ferritin, B12 (dx anemia) - met b (dx CKD) - lipid panel and liver function tests (dx hypercholesterolemia) and tsh (dx hypercholesterolemia).

## 2019-10-25 LAB — TSH: TSH: 2.62 (ref 0.41–5.90)

## 2019-10-25 LAB — HEPATIC FUNCTION PANEL
ALT: 8 (ref 7–35)
ALT: 8 (ref 7–35)
AST: 17 (ref 13–35)
AST: 76 — AB (ref 13–35)
Alkaline Phosphatase: 76 (ref 25–125)
Alkaline Phosphatase: 76 (ref 25–125)
Bilirubin, Direct: 0.04 (ref 0.01–0.4)
Bilirubin, Total: 0.2

## 2019-10-25 LAB — COMPREHENSIVE METABOLIC PANEL
Albumin: 3.9 (ref 3.5–5.0)
Calcium: 9.1 (ref 8.7–10.7)

## 2019-10-25 LAB — BASIC METABOLIC PANEL
BUN: 26 — AB (ref 4–21)
CO2: 18 (ref 13–22)
Chloride: 105 (ref 99–108)
Creatinine: 0.9 (ref 0.5–1.1)
Sodium: 141 (ref 137–147)

## 2019-10-25 LAB — CBC AND DIFFERENTIAL
HCT: 29 — AB (ref 36–46)
Hemoglobin: 9.5 — AB (ref 12.0–16.0)
Neutrophils Absolute: 4
Platelets: 299 (ref 150–399)
WBC: 8.3

## 2019-10-25 LAB — LIPID PANEL
Cholesterol: 276 — AB (ref 0–200)
HDL: 51 (ref 35–70)
LDL Cholesterol: 192
Triglycerides: 174 — AB (ref 40–160)

## 2019-10-25 LAB — IRON,TIBC AND FERRITIN PANEL: Ferritin: 3.8

## 2019-10-25 LAB — CBC: RBC: 3.17 — AB (ref 3.87–5.11)

## 2019-10-25 LAB — VITAMIN B12: Vitamin B-12: 378

## 2019-10-25 NOTE — Telephone Encounter (Signed)
Just have them draw what they are able to draw and we will forward to nephrology.

## 2019-10-25 NOTE — Telephone Encounter (Signed)
[facility] is aware.  °

## 2019-10-28 ENCOUNTER — Encounter: Payer: Self-pay | Admitting: Internal Medicine

## 2019-10-28 DIAGNOSIS — M79676 Pain in unspecified toe(s): Secondary | ICD-10-CM | POA: Insufficient documentation

## 2019-10-28 NOTE — Assessment & Plan Note (Signed)
Right great toe pain with open skin, dried blood and minimal pain to palpation.  No redness extending up toe.  Treat with bactroban ointment as directed.  Continue epsom salt soaks.  Follow closely.  Call with update.

## 2019-10-28 NOTE — Assessment & Plan Note (Signed)
Due labs.  Have home health draw.

## 2019-10-31 ENCOUNTER — Other Ambulatory Visit: Payer: Self-pay | Admitting: Internal Medicine

## 2019-11-07 ENCOUNTER — Other Ambulatory Visit: Payer: Self-pay | Admitting: Internal Medicine

## 2019-11-17 ENCOUNTER — Telehealth: Payer: Self-pay | Admitting: Internal Medicine

## 2019-11-17 NOTE — Telephone Encounter (Signed)
Per telephone message 10/24/19 - home health was to draw labs.  Did we receive.  Do not see abstracted.  I have no report.  Please check if drawn and need lab results.

## 2019-11-19 NOTE — Telephone Encounter (Signed)
Left message for [facility] home health. Need results. Did not receive.

## 2019-11-20 NOTE — Telephone Encounter (Signed)
Spoke with [place] and was advised that labs were faxed. Waiting for fax to come through.

## 2019-11-20 NOTE — Telephone Encounter (Signed)
Lab results received and given to Dr Nicki Reaper for review

## 2019-11-20 NOTE — Telephone Encounter (Signed)
Notify pt that her kidney function has improved.  Hgb stable,  still decreased but stable.  Given persistent decrease, I would like for her to see hematology to confirm no further w/up or treatment warranted.  If agreeable, let me know and I will place the order for the referral.  Thyroid test and liver function tests wnl.  Cholesterol elevated - total cholesteorl 276 and bad cholesterol 192.  Specialist recommended stopping statin medication.  Hold on cholesterol medication at this time.

## 2019-11-20 NOTE — Telephone Encounter (Signed)
[place] with [facility] called  returning your call  Please call her at [phone]

## 2019-11-21 NOTE — Telephone Encounter (Signed)
Patient aware of results. Agreeable to see hematology- will need all visits virtual. Would like copy of labs mailed to her. I will send copy once I have abstracted.

## 2019-11-22 NOTE — Telephone Encounter (Signed)
Noted  

## 2019-12-03 ENCOUNTER — Telehealth: Payer: Self-pay | Admitting: Internal Medicine

## 2019-12-03 NOTE — Telephone Encounter (Signed)
Patient dropped off FMLA paper work to be completed. Paper work is up front in [employer]

## 2019-12-05 NOTE — Telephone Encounter (Signed)
FMLA paperwork placed in your folder

## 2019-12-06 NOTE — Telephone Encounter (Signed)
Need more information and clarify exactly what is needed on the FMLA form.  The form if for the husband's work?  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  So he is available to work daily, just not these hours?

## 2019-12-07 DIAGNOSIS — Z0279 Encounter for issue of other medical certificate: Secondary | ICD-10-CM

## 2019-12-07 NOTE — Telephone Encounter (Signed)
Yes the forms are for her husbands employer. Needs to state that he cannot work from the hours of 4:00-4:30pm until the next morning at 8:30 when care giver returns. He will be able to work daily other wise. Also patient noted that she still has not heard anything about seeing a hematologist.

## 2019-12-08 ENCOUNTER — Other Ambulatory Visit: Payer: Self-pay | Admitting: Internal Medicine

## 2019-12-08 DIAGNOSIS — D649 Anemia, unspecified: Secondary | ICD-10-CM

## 2019-12-08 NOTE — Progress Notes (Signed)
Order placed for hematology referral.  

## 2019-12-08 NOTE — Telephone Encounter (Signed)
Form completed and signed and placed in box.

## 2019-12-11 NOTE — Telephone Encounter (Signed)
Husband picked up forms. Copy sent to charge and scan

## 2019-12-12 ENCOUNTER — Inpatient Hospital Stay: Payer: 59 | Admitting: Internal Medicine

## 2019-12-12 ENCOUNTER — Inpatient Hospital Stay: Payer: 59

## 2019-12-19 ENCOUNTER — Inpatient Hospital Stay: Payer: 59 | Attending: Internal Medicine | Admitting: Internal Medicine

## 2019-12-19 ENCOUNTER — Ambulatory Visit: Payer: 59

## 2019-12-19 ENCOUNTER — Inpatient Hospital Stay: Payer: 59

## 2019-12-19 ENCOUNTER — Other Ambulatory Visit: Payer: Self-pay

## 2019-12-19 DIAGNOSIS — N189 Chronic kidney disease, unspecified: Secondary | ICD-10-CM | POA: Diagnosis not present

## 2019-12-19 DIAGNOSIS — D649 Anemia, unspecified: Secondary | ICD-10-CM

## 2019-12-19 DIAGNOSIS — Z993 Dependence on wheelchair: Secondary | ICD-10-CM | POA: Insufficient documentation

## 2019-12-19 DIAGNOSIS — N059 Unspecified nephritic syndrome with unspecified morphologic changes: Secondary | ICD-10-CM | POA: Insufficient documentation

## 2019-12-19 DIAGNOSIS — Z87891 Personal history of nicotine dependence: Secondary | ICD-10-CM | POA: Insufficient documentation

## 2019-12-19 DIAGNOSIS — Z801 Family history of malignant neoplasm of trachea, bronchus and lung: Secondary | ICD-10-CM | POA: Diagnosis not present

## 2019-12-19 LAB — FERRITIN: Ferritin: 185 ng/mL (ref 11–307)

## 2019-12-19 LAB — FOLATE: Folate: 16.2 ng/mL

## 2019-12-19 LAB — CBC WITH DIFFERENTIAL/PLATELET
Abs Immature Granulocytes: 0.01 10*3/uL (ref 0.00–0.07)
Basophils Absolute: 0.1 10*3/uL (ref 0.0–0.1)
Basophils Relative: 1 %
Eosinophils Absolute: 0.1 10*3/uL (ref 0.0–0.5)
Eosinophils Relative: 2 %
HCT: 31 % — ABNORMAL LOW (ref 36.0–46.0)
Hemoglobin: 9.8 g/dL — ABNORMAL LOW (ref 12.0–15.0)
Immature Granulocytes: 0 %
Lymphocytes Relative: 39 %
Lymphs Abs: 2.8 10*3/uL (ref 0.7–4.0)
MCH: 30.2 pg (ref 26.0–34.0)
MCHC: 31.6 g/dL (ref 30.0–36.0)
MCV: 95.4 fL (ref 80.0–100.0)
Monocytes Absolute: 0.6 10*3/uL (ref 0.1–1.0)
Monocytes Relative: 8 %
Neutro Abs: 3.5 10*3/uL (ref 1.7–7.7)
Neutrophils Relative %: 50 %
Platelets: 282 10*3/uL (ref 150–400)
RBC: 3.25 MIL/uL — ABNORMAL LOW (ref 3.87–5.11)
RDW: 12.8 % (ref 11.5–15.5)
WBC: 7 10*3/uL (ref 4.0–10.5)
nRBC: 0 % (ref 0.0–0.2)

## 2019-12-19 LAB — TECHNOLOGIST SMEAR REVIEW
Plt Morphology: NORMAL
RBC Morphology: NORMAL

## 2019-12-19 LAB — COMPREHENSIVE METABOLIC PANEL WITH GFR
ALT: 15 U/L (ref 0–44)
AST: 19 U/L (ref 15–41)
Albumin: 3.7 g/dL (ref 3.5–5.0)
Alkaline Phosphatase: 64 U/L (ref 38–126)
Anion gap: 10 (ref 5–15)
BUN: 40 mg/dL — ABNORMAL HIGH (ref 8–23)
CO2: 24 mmol/L (ref 22–32)
Calcium: 9.3 mg/dL (ref 8.9–10.3)
Chloride: 106 mmol/L (ref 98–111)
Creatinine, Ser: 0.95 mg/dL (ref 0.44–1.00)
GFR calc Af Amer: >60 mL/min
GFR calc non Af Amer: >60 mL/min
Glucose, Bld: 99 mg/dL (ref 70–99)
Potassium: 4.6 mmol/L (ref 3.5–5.1)
Sodium: 140 mmol/L (ref 135–145)
Total Bilirubin: 0.4 mg/dL (ref 0.3–1.2)
Total Protein: 7.5 g/dL (ref 6.5–8.1)

## 2019-12-19 LAB — SAMPLE TO BLOOD BANK

## 2019-12-19 LAB — C-REACTIVE PROTEIN: CRP: 0.6 mg/dL (ref <1.0)

## 2019-12-19 LAB — IRON AND TIBC
Iron: 76 ug/dL (ref 28–170)
Saturation Ratios: 23 % (ref 10.4–31.8)
TIBC: 330 ug/dL (ref 250–450)
UIBC: 254 ug/dL

## 2019-12-19 LAB — LACTATE DEHYDROGENASE: LDH: 136 U/L (ref 98–192)

## 2019-12-19 LAB — VITAMIN B12: Vitamin B-12: 262 pg/mL (ref 180–914)

## 2019-12-19 NOTE — Assessment & Plan Note (Addendum)
#  Normocytic anemia-etiology unclear; likely secondary to underlying chronic kidney disease/medications-mycophenolate versus others..  Mildly symptomatic.  #Recommend further work-up with CBC CMP C-reactive protein erythropoietin; ferritin iron studies folate acid B12 LDH; review of smear.  #CKD/glomerulonephritis-as per nephrology.  Thank you Dr.Scott for allowing me to participate in the care of your pleasant patient. Please do not hesitate to contact me with questions or concerns in the interim.   # [phone]/cell # labs today-ordered.  # Follow up TBD- Dr.B  Addendum: Hemoglobin-9.8 creatinine-0.9; LDH normal; L46 folic acid within normal limits.  Iron studies-23% saturation; ferritin 185.  CRP normal.  No clear explanation for patient's anemia noted.  For now recommending monitoring in 3 months.  If worse then would recommend bone marrow biopsy for evaluation.  Discussed with patient.

## 2019-12-19 NOTE — Progress Notes (Signed)
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   #History of CAD/MI   HISTORY OF PRESENTING ILLNESS: Unfortunately labs from referral providers office unavailable at the time of visit. Reviewed the records at length-office note from [facility] providers summarized above.   Leah Espinoza 64 y.o.  female with above history of complicated glomerulonephritis- has been referred to [place] for further evaluation/work-up for anemia.  Patient states that she had very complicated hospital course-in March 2020 at [facility] to be arising from glomerulonephritis; necrotizing myopathy.  Patient also had cardiac arrest s/p resuscitation; PEG tube and tracheostomy.  Patient was discharged to [facility].   Patient had recent evaluation with the PCP-when labs were drawn through home health.  Labs unavailable for review.   Blood in stools: None Change in bowel habits- None Blood in urine: None Difficulty swallowing: None Abnormal weight loss: None Iron supplementation: none Prior Blood transfusions: Blood transfusion last year.  Vaginal bleeding: None  Review of Systems  Constitutional: Positive for malaise/fatigue. Negative for chills, diaphoresis, fever and weight loss.  HENT: Negative for nosebleeds and sore throat.   Eyes: Negative for double vision.  Respiratory: Negative for cough, hemoptysis, sputum production, shortness of breath and wheezing.   Cardiovascular: Negative for chest pain, palpitations, orthopnea and leg swelling.  Gastrointestinal: Negative for abdominal pain, blood in stool, constipation, diarrhea, heartburn, melena, nausea and  vomiting.  Genitourinary: Negative for dysuria, frequency and urgency.  Musculoskeletal: Positive for back pain and joint pain.  Skin: Negative.  Negative for itching and rash.  Neurological: Positive for weakness. Negative for dizziness, tingling, focal weakness and headaches.  Endo/Heme/Allergies: Does not bruise/bleed easily.  Psychiatric/Behavioral: Negative for depression. The patient is not nervous/anxious and does not have insomnia.     MEDICAL HISTORY:  Past Medical History:  Diagnosis Date  . Family history of adverse reaction to anesthesia    Sister - PONV  . History of chicken pox   . History of fainting   . Hyperlipidemia   . Hypertension   . MI (myocardial infarction) ([place])   . Motion sickness    circular motion  . Vertigo    no episodes for several months    SURGICAL HISTORY: Past Surgical History:  Procedure Laterality Date  . CARDIAC CATHETERIZATION N/A 12/23/2015   Procedure: Left Heart Cath and Coronary Angiography;  Surgeon: Isaias Cowman, MD;  Location: [place] [facility];  Service: Cardiovascular;  Laterality: N/A;  . CARDIAC CATHETERIZATION N/A 12/23/2015   Procedure: Coronary Stent Intervention;  Surgeon: Isaias Cowman, MD;  Location: [place] [facility];  Service: Cardiovascular;  Laterality: N/A;  . CATARACT EXTRACTION W/PHACO Right 07/27/2017   Procedure: CATARACT EXTRACTION PHACO AND INTRAOCULAR LENS PLACEMENT ([place]) RIGHT SYMFONY LENS;  Surgeon: Leandrew Koyanagi, MD;  Location: [place];  Service: Ophthalmology;  Laterality: Right;  . CATARACT EXTRACTION W/PHACO Left 08/10/2017   Procedure: CATARACT EXTRACTION PHACO AND INTRAOCULAR LENS PLACEMENT ([place]) LEFT SYMFONY TORIC LENS;  Surgeon: Leandrew Koyanagi, MD;  Location: [place];  Service: Ophthalmology;  Laterality: Left;  . COLONOSCOPY      SOCIAL HISTORY: Social History   Socioeconomic History  . Marital status: Married    Spouse name: Not on file  .  Number  of children: Not on file  . Years of education: Not on file  . Highest education level: Not on file  Occupational History  . Not on file  Tobacco Use  . Smoking status: Former Research scientist (life sciences)  . Smokeless tobacco: Never Used  . Tobacco comment: smoked for approx 1 yr around age 28  Substance and Sexual Activity  . Alcohol use: No    Alcohol/week: 0.0 standard drinks  . Drug use: No  . Sexual activity: Not on file  Other Topics Concern  . Not on file  Social History Narrative  . Not on file   Social Determinants of Health   Financial Resource Strain:   . Difficulty of Paying Living Expenses:   Food Insecurity:   . Worried About Charity fundraiser in the Last Year:   . Arboriculturist in the Last Year:   Transportation Needs:   . Film/video editor (Medical):   Marland Kitchen Lack of Transportation (Non-Medical):   Physical Activity:   . Days of Exercise per Week:   . Minutes of Exercise per Session:   Stress:   . Feeling of Stress :   Social Connections:   . Frequency of Communication with Friends and Family:   . Frequency of Social Gatherings with Friends and Family:   . Attends Religious Services:   . Active Member of Clubs or Organizations:   . Attends Archivist Meetings:   Marland Kitchen Marital Status:   Intimate Partner Violence:   . Fear of Current or Ex-Partner:   . Emotionally Abused:   Marland Kitchen Physically Abused:   . Sexually Abused:     FAMILY HISTORY: Family History  Problem Relation Age of Onset  . Hyperlipidemia Mother   . Heart disease Mother   . Cancer Father        lung  . Sudden death Brother   . Arthritis Maternal Grandmother     ALLERGIES:  has No Known Allergies.  MEDICATIONS:  Current Outpatient Medications  Medication Sig Dispense Refill  . apixaban (ELIQUIS) 2.5 MG TABS tablet Take by mouth 2 (two) times daily.    . Cholecalciferol (VITAMIN D-1000 MAX ST) 25 MCG (1000 UT) tablet Take 1,000 Units by mouth daily.     . hydroxychloroquine (PLAQUENIL)  200 MG tablet Take 200 mg by mouth daily.     . Melatonin 5 MG TBDP Take 3 mg by mouth every evening.     . metoprolol tartrate (LOPRESSOR) 25 MG tablet Take 6.25 mg by mouth 2 (two) times daily.    . mirtazapine (REMERON) 30 MG tablet Take 30 mg by mouth at bedtime.    . mycophenolate (CELLCEPT) 500 MG tablet Take 500 mg by mouth 2 (two) times daily.     . sevelamer carbonate (RENVELA) 800 MG tablet Take 800 mg by mouth at bedtime.    . vitamin C (ASCORBIC ACID) 250 MG tablet Take 250 mg by mouth daily.     No current facility-administered medications for this visit.      PHYSICAL EXAMINATION:   Vitals:   12/19/19 1415  BP: 120/78  Pulse: 61  Resp: 20  Temp: (!) 97.3 F (36.3 C)  SpO2: 100%   There were no vitals filed for this visit.  Physical Exam  Constitutional: She is oriented to person, place, and time and well-developed, well-nourished, and in no distress.  Patient is in a wheelchair.  HENT:  Head: Normocephalic and atraumatic.  Mouth/Throat: Oropharynx is clear and moist. No  oropharyngeal exudate.  Eyes: Pupils are equal, round, and reactive to light.  Cardiovascular: Normal rate and regular rhythm.  Pulmonary/Chest: Effort normal and breath sounds normal. No respiratory distress. She has no wheezes.  Abdominal: Soft. Bowel sounds are normal. She exhibits no distension and no mass. There is no abdominal tenderness. There is no rebound and no guarding.  Musculoskeletal:        General: No tenderness or edema. Normal range of motion.     Cervical back: Normal range of motion and neck supple.     Comments: Weakness bilateral lower extremities.  Neurological: She is alert and oriented to person, place, and time.  Skin: Skin is warm.  Psychiatric: Affect normal.    LABORATORY DATA:  I have reviewed the data as listed Lab Results  Component Value Date   WBC 7.0 12/19/2019   HGB 9.8 (L) 12/19/2019   HCT 31.0 (L) 12/19/2019   MCV 95.4 12/19/2019   PLT 282  12/19/2019   Recent Labs    12/19/19 1536  NA 140  K 4.6  CL 106  CO2 24  GLUCOSE 99  BUN 40*  CREATININE 0.95  CALCIUM 9.3  GFRNONAA >60  GFRAA >60  PROT 7.5  ALBUMIN 3.7  AST 19  ALT 15  ALKPHOS 64  BILITOT 0.4     No results found.  Normocytic anemia #Normocytic anemia-etiology unclear; likely secondary to underlying chronic kidney disease/medications-mycophenolate versus others..  Mildly symptomatic.  #Recommend further work-up with CBC CMP C-reactive protein erythropoietin; ferritin iron studies folate acid B12 LDH; review of smear.  #CKD/glomerulonephritis-as per nephrology.  Thank you Dr.Scott for allowing me to participate in the care of your pleasant patient. Please do not hesitate to contact me with questions or concerns in the interim.   # DISPOSITION:[phone]/cell # labs today-ordered.  # Follow up TBD- Dr.B  Addendum: Hemoglobin-9.8 creatinine-0.9; LDH normal; P49 folic acid within normal limits.  Iron studies-23% saturation; ferritin 185.  CRP normal.  No clear explanation for patient's anemia noted.  For now recommending monitoring in 3 months.  If worse then would recommend bone marrow biopsy for evaluation.  Discussed with patient.  All questions were answered. The patient knows to call the clinic with any problems, questions or concerns.    Cammie Sickle, MD 12/20/2019 8:25 AM

## 2019-12-20 LAB — ERYTHROPOIETIN: Erythropoietin: 20.7 m[IU]/mL — ABNORMAL HIGH (ref 2.6–18.5)

## 2019-12-21 ENCOUNTER — Encounter: Payer: Self-pay | Admitting: Internal Medicine

## 2019-12-21 ENCOUNTER — Telehealth: Payer: Self-pay | Admitting: Internal Medicine

## 2019-12-21 DIAGNOSIS — D649 Anemia, unspecified: Secondary | ICD-10-CM

## 2019-12-21 NOTE — Telephone Encounter (Signed)
On 4/08- spoke to pt re: results of blood work-hemoglobin 9.5.  Renal function normal.  No obvious evidence of any etiology noted-unremarkable iron studies Y70 folic acid.  Discussed if worse would recommend a bone marrow biopsy.  Recommend follow-up in approximately 3 months.   #please schedule- Follow-up-second week of July-MD; labs-CBC CMP LDH- Thx GB

## 2019-12-21 NOTE — Addendum Note (Signed)
Addended by: Gloris Ham on: 12/21/2019 09:30 AM   Modules accepted: Orders

## 2020-01-01 ENCOUNTER — Other Ambulatory Visit: Payer: Self-pay

## 2020-01-01 ENCOUNTER — Ambulatory Visit (INDEPENDENT_AMBULATORY_CARE_PROVIDER_SITE_OTHER): Payer: 59 | Admitting: Internal Medicine

## 2020-01-01 DIAGNOSIS — N059 Unspecified nephritic syndrome with unspecified morphologic changes: Secondary | ICD-10-CM

## 2020-01-01 DIAGNOSIS — E7800 Pure hypercholesterolemia, unspecified: Secondary | ICD-10-CM

## 2020-01-01 DIAGNOSIS — I700 Atherosclerosis of aorta: Secondary | ICD-10-CM

## 2020-01-01 DIAGNOSIS — Z8371 Family history of colonic polyps: Secondary | ICD-10-CM

## 2020-01-01 DIAGNOSIS — M79674 Pain in right toe(s): Secondary | ICD-10-CM

## 2020-01-01 DIAGNOSIS — I252 Old myocardial infarction: Secondary | ICD-10-CM

## 2020-01-01 DIAGNOSIS — N019 Rapidly progressive nephritic syndrome with unspecified morphologic changes: Secondary | ICD-10-CM

## 2020-01-01 NOTE — Progress Notes (Signed)
Patient ID: Leah Espinoza, female   DOB: June 19, 1956, 64 y.o.   MRN: [id number]   Virtual Visit via video Note  This visit type was conducted due to national recommendations for restrictions regarding the COVID-19 pandemic (e.g. social distancing).  This format is felt to be most appropriate for this patient at this time.  All issues noted in this document were discussed and addressed.  No physical exam was performed (except for noted visual exam findings with Video Visits).   I connected with [facility] by a video enabled telemedicine application and verified that I am speaking with the correct person using two identifiers. Location patient: home Location provider: work Persons participating in the virtual visit: patient, provider  The limitations, risks, security and privacy concerns of performing an evaluation and management service by video and the availability of in person appointments have been discussed. The patient expressed understanding and agreed to proceed.   Reason for visit: scheduled follow up.    HPI: She reports she is doing relatively well.  She is followed at [place] and nephrology.  Tapering off plaquenil.  Still having issues with right great toe nail.  Treated for infection.  Raised area. Discussed referral to podiatry.  No chest pain or sob reported.  No abdominal pain.  Bowels stable.  Her appetite is good.  States she does not need anything to increase her appetite.  On remeron.  Discussed decreasing dose.  Blood pressure:  120/78.  Is being followed by hematology - for anemia.  Wants to get labs drawn - one place.     ROS: See pertinent positives and negatives per HPI.  Past Medical History:  Diagnosis Date  . Family history of adverse reaction to anesthesia    Sister - PONV  . History of chicken pox   . History of fainting   . Hyperlipidemia   . Hypertension   . MI (myocardial infarction) ([place])   . Motion sickness    circular motion  . Vertigo     no episodes for several months    Past Surgical History:  Procedure Laterality Date  . CARDIAC CATHETERIZATION N/A 12/23/2015   Procedure: Left Heart Cath and Coronary Angiography;  Surgeon: Isaias Cowman, MD;  Location: [place] [facility];  Service: Cardiovascular;  Laterality: N/A;  . CARDIAC CATHETERIZATION N/A 12/23/2015   Procedure: Coronary Stent Intervention;  Surgeon: Isaias Cowman, MD;  Location: [place] [facility];  Service: Cardiovascular;  Laterality: N/A;  . CATARACT EXTRACTION W/PHACO Right 07/27/2017   Procedure: CATARACT EXTRACTION PHACO AND INTRAOCULAR LENS PLACEMENT ([place]) RIGHT SYMFONY LENS;  Surgeon: Leandrew Koyanagi, MD;  Location: [place];  Service: Ophthalmology;  Laterality: Right;  . CATARACT EXTRACTION W/PHACO Left 08/10/2017   Procedure: CATARACT EXTRACTION PHACO AND INTRAOCULAR LENS PLACEMENT ([place]) LEFT SYMFONY TORIC LENS;  Surgeon: Leandrew Koyanagi, MD;  Location: [place];  Service: Ophthalmology;  Laterality: Left;  . COLONOSCOPY      Family History  Problem Relation Age of Onset  . Hyperlipidemia Mother   . Heart disease Mother   . Cancer Father        lung  . Sudden death Brother   . Arthritis Maternal Grandmother     SOCIAL HX: reviewed.    Current Outpatient Medications:  .  apixaban (ELIQUIS) 2.5 MG TABS tablet, Take by mouth 2 (two) times daily., Disp: , Rfl:  .  Cholecalciferol (VITAMIN D-1000 MAX ST) 25 MCG (1000 UT) tablet, Take 1,000 Units by mouth  daily. , Disp: , Rfl:  .  hydroxychloroquine (PLAQUENIL) 200 MG tablet, Take 200 mg by mouth daily. , Disp: , Rfl:  .  Melatonin 5 MG TBDP, Take 3 mg by mouth every evening. , Disp: , Rfl:  .  metoprolol tartrate (LOPRESSOR) 25 MG tablet, Take 6.25 mg by mouth 2 (two) times daily., Disp: , Rfl:  .  mirtazapine (REMERON) 30 MG tablet, Take 30 mg by mouth at bedtime., Disp: , Rfl:  .  mycophenolate (CELLCEPT) 500 MG tablet, Take 500 mg by mouth 2  (two) times daily. , Disp: , Rfl:  .  sevelamer carbonate (RENVELA) 800 MG tablet, Take 800 mg by mouth at bedtime., Disp: , Rfl:  .  vitamin C (ASCORBIC ACID) 250 MG tablet, Take 250 mg by mouth daily., Disp: , Rfl:   EXAM:  VITALS per patient if applicable: 264/15  GENERAL: alert, oriented, appears well and in no acute distress  HEENT: atraumatic, conjunttiva clear, no obvious abnormalities on inspection of external nose and ears  NECK: normal movements of the head and neck  LUNGS: on inspection no signs of respiratory distress, breathing rate appears normal, no obvious gross SOB, gasping or wheezing  CV: no obvious cyanosis  PSYCH/NEURO: pleasant and cooperative, no obvious depression or anxiety, speech and thought processing grossly intact  ASSESSMENT AND PLAN:  Discussed the following assessment and plan:  Aortic atherosclerosis ([place]) Off lipitor now per specialist.  Follow.   Family history of colonic polyps Colonoscopy 12/2013.  Recommended f/u in 10 years.   Glomerulonephritis Followed by nephrology.    Great toe pain Persistent right great toe pain.  Has tried bactroban and epsom salt soaks.  Persistent problem.  Refer to podiatry.   History of ST elevation myocardial infarction (STEMI) Followed by cardiology.  Continue risk factor mofication.    Hypercholesterolemia Off lipitor.  Low cholesterol diet and exercise.  Follow lipid panel.    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     Orders Placed This Encounter  Procedures  . Ambulatory referral to Podiatry    Referral Priority:   Routine    Referral Type:   Consultation    Referral Reason:   Specialty Services Required    Requested Specialty:   Podiatry    Number of Visits Requested:   1     I discussed the assessment and treatment plan with the patient. The patient was provided an opportunity to ask questions and all were answered. The patient agreed with the  plan and demonstrated an understanding of the instructions.   The patient was advised to call back or seek an in-person evaluation if the symptoms worsen or if the condition fails to improve as anticipated.    Einar Pheasant, MD

## 2020-01-06 ENCOUNTER — Encounter: Payer: Self-pay | Admitting: Internal Medicine

## 2020-01-06 ENCOUNTER — Telehealth: Payer: Self-pay | Admitting: Internal Medicine

## 2020-01-06 DIAGNOSIS — E7800 Pure hypercholesterolemia, unspecified: Secondary | ICD-10-CM

## 2020-01-06 NOTE — Telephone Encounter (Signed)
Pt planning for f/u labs at oncology.  Pt requested to have our labs added - to not to have to get stuck twice.  Please notify them to add lipid panel (dx hypercholesterolemia) to next blood work.

## 2020-01-06 NOTE — Assessment & Plan Note (Signed)
Followed by cardiology.  Continue risk factor mofication.

## 2020-01-06 NOTE — Assessment & Plan Note (Signed)
Off lipitor.  Low cholesterol diet and exercise.  Follow lipid panel.

## 2020-01-06 NOTE — Assessment & Plan Note (Signed)
Followed by nephrology. 

## 2020-01-06 NOTE — Assessment & Plan Note (Signed)
Off lipitor now per specialist.  Follow.

## 2020-01-06 NOTE — Assessment & Plan Note (Signed)
Colonoscopy 12/2013.  Recommended f/u in 10 years.   

## 2020-01-06 NOTE — Assessment & Plan Note (Signed)
Persistent right great toe pain.  Has tried bactroban and epsom salt soaks.  Persistent problem.  Refer to podiatry.

## 2020-01-08 NOTE — Telephone Encounter (Signed)
Next appt with oncology is in July. Will send over order for lipid panel.

## 2020-01-15 ENCOUNTER — Other Ambulatory Visit: Payer: Self-pay

## 2020-01-15 ENCOUNTER — Encounter: Payer: Self-pay | Admitting: Podiatry

## 2020-01-15 ENCOUNTER — Ambulatory Visit (INDEPENDENT_AMBULATORY_CARE_PROVIDER_SITE_OTHER): Payer: 59 | Admitting: Podiatry

## 2020-01-15 DIAGNOSIS — M79674 Pain in right toe(s): Secondary | ICD-10-CM

## 2020-01-15 DIAGNOSIS — S90211A Contusion of right great toe with damage to nail, initial encounter: Secondary | ICD-10-CM

## 2020-01-16 ENCOUNTER — Encounter: Payer: Self-pay | Admitting: Podiatry

## 2020-01-16 NOTE — Progress Notes (Signed)
Subjective:  Patient ID: Leah Espinoza, female    DOB: 01-Jul-1956,  MRN: [id number]  Chief Complaint  Patient presents with  . Nail Problem    64 y.o. female presents with the above complaint.  Patient presents with right great toenail contusion with damage to the toenail.  Patient states that this happened last month and has progressively gotten worse.  There is now blister formation at the nail matrix patient states the nail started coming off 2 weeks ago.  It has already fallen off once this will be the second time it is coming off.  Patient was seen by primary care physician who gave him mupirocin ointment and started soaking in Epsom salt.  However patient is worried that this is not healing and would like to discuss other treatment options for this.  She denies any other acute complaints.  She has not seen any specialist prior to seeing me.   Review of Systems: Negative except as noted in the HPI. Denies N/V/F/Ch.  Past Medical History:  Diagnosis Date  . Family history of adverse reaction to anesthesia    Sister - PONV  . History of chicken pox   . History of fainting   . Hyperlipidemia   . Hypertension   . MI (myocardial infarction) ([place])   . Motion sickness    circular motion  . Vertigo    no episodes for several months    Current Outpatient Medications:  .  apixaban (ELIQUIS) 2.5 MG TABS tablet, Take by mouth 2 (two) times daily., Disp: , Rfl:  .  Cholecalciferol (VITAMIN D-1000 MAX ST) 25 MCG (1000 UT) tablet, Take 1,000 Units by mouth daily. , Disp: , Rfl:  .  Melatonin 5 MG TBDP, Take 3 mg by mouth every evening. , Disp: , Rfl:  .  metoprolol tartrate (LOPRESSOR) 25 MG tablet, Take 6.25 mg by mouth 2 (two) times daily., Disp: , Rfl:  .  mirtazapine (REMERON) 30 MG tablet, Take 30 mg by mouth at bedtime., Disp: , Rfl:  .  mycophenolate (CELLCEPT) 500 MG tablet, Take 500 mg by mouth 2 (two) times daily. , Disp: , Rfl:  .  sevelamer carbonate (RENVELA) 800 MG tablet,  Take 800 mg by mouth at bedtime., Disp: , Rfl:  .  vitamin C (ASCORBIC ACID) 250 MG tablet, Take 250 mg by mouth daily., Disp: , Rfl:   Social History   Tobacco Use  Smoking Status Former Smoker  Smokeless Tobacco Never Used  Tobacco Comment   smoked for approx 1 yr around age 56    No Known Allergies Objective:  There were no vitals filed for this visit. There is no height or weight on file to calculate BMI. Constitutional Well developed. Well nourished.  Vascular Dorsalis pedis pulses palpable bilaterally. Posterior tibial pulses palpable bilaterally. Capillary refill normal to all digits.  No cyanosis or clubbing noted. Pedal hair growth normal.  Neurologic Normal speech. Oriented to person, place, and time. Epicritic sensation to light touch grossly present bilaterally.  Dermatologic Nails well groomed and normal in appearance. No open wounds. No skin lesions.  Orthopedic:  Mild pain on palpation to the right great toenail.  There does not appear to be any purulent drainage or any clinical signs of infection.  Hyper granulation tissue noted at the base of the right great toenail in the proximal nail fold.  No erythema noted.   Radiographs: None Assessment:   1. Contusion of right great toe with damage to nail, initial encounter  2. Great toe pain, right    Plan:  Patient was evaluated and treated and all questions answered.  Right great toenail nail contusion with possible damage to the nail matrix -I explained to the patient the etiology of nail contusion and various treatment options were extensively discussed.  I believe that there might be an involvement of nail matrix that could be damaged that is likely leading to dystrophic nail that continues to keep falling down every couple of months.  I discussed with the patient that she may benefit from total nail avulsion with phenol matricectomy given that the nail keeps falling off after starting length of time.  Patient  states that she would like to discuss all the treatment options and will hold off any procedure at this time.  If there is worsening infection patient will come back and see me for management. -She will also come back and see me when she is ready to have the nail completely removed permanently.  No follow-ups on file.

## 2020-02-05 ENCOUNTER — Other Ambulatory Visit: Payer: Self-pay | Admitting: Internal Medicine

## 2020-02-21 NOTE — Telephone Encounter (Signed)
Order faxed to Dr Rogue Bussing.

## 2020-03-11 ENCOUNTER — Ambulatory Visit: Payer: 59 | Admitting: Podiatry

## 2020-03-25 ENCOUNTER — Telehealth: Payer: Self-pay | Admitting: Internal Medicine

## 2020-03-25 NOTE — Telephone Encounter (Signed)
Patient phoned on this date and cancelled lab/MD appt on 03-27-20 as she was changing insurances and stated that she would phone back at a later time to reschedule.

## 2020-03-27 ENCOUNTER — Inpatient Hospital Stay: Payer: 59

## 2020-03-27 ENCOUNTER — Inpatient Hospital Stay: Payer: 59 | Admitting: Internal Medicine

## 2020-04-02 ENCOUNTER — Other Ambulatory Visit: Payer: Self-pay | Admitting: Internal Medicine

## 2020-04-10 ENCOUNTER — Other Ambulatory Visit: Payer: Self-pay

## 2020-04-10 ENCOUNTER — Telehealth (INDEPENDENT_AMBULATORY_CARE_PROVIDER_SITE_OTHER): Payer: 59 | Admitting: Internal Medicine

## 2020-04-10 DIAGNOSIS — E7800 Pure hypercholesterolemia, unspecified: Secondary | ICD-10-CM

## 2020-04-10 DIAGNOSIS — F439 Reaction to severe stress, unspecified: Secondary | ICD-10-CM

## 2020-04-10 DIAGNOSIS — I700 Atherosclerosis of aorta: Secondary | ICD-10-CM

## 2020-04-10 DIAGNOSIS — G7289 Other specified myopathies: Secondary | ICD-10-CM

## 2020-04-10 DIAGNOSIS — N019 Rapidly progressive nephritic syndrome with unspecified morphologic changes: Secondary | ICD-10-CM

## 2020-04-10 DIAGNOSIS — I10 Essential (primary) hypertension: Secondary | ICD-10-CM

## 2020-04-10 DIAGNOSIS — D649 Anemia, unspecified: Secondary | ICD-10-CM | POA: Diagnosis not present

## 2020-04-10 NOTE — Progress Notes (Signed)
Patient ID: Leah Espinoza, female   DOB: 1956-09-13, 64 y.o.   MRN: [id number]   Virtual Visit via video Note  This visit type was conducted due to national recommendations for restrictions regarding the COVID-19 pandemic (e.g. social distancing).  This format is felt to be most appropriate for this patient at this time.  All issues noted in this document were discussed and addressed.  No physical exam was performed (except for noted visual exam findings with Video Visits).   I connected with [facility] by a video enabled telemedicine application and verified that I am speaking with the correct person using two identifiers. Location patient: home Location provider: work  Persons participating in the virtual visit: patient, provider  The limitations, risks, security and privacy concerns of performing an evaluation and management service by video and the availability of in person appointments gave beeb discussed.   It has also been discussed with the patient that there may be a patient responsible charge related to this service. The patient expressed understanding and agreed to proceed.   Reason for visit: scheduled follow up.   HPI: She reports she is doing relatively well.  She has been gradually improving - movements, strength, etc.  She can swing her legs back and forth and hold leg up.  She is unable to reach up.  Unable to stand.  Doing her therapy and exercises.  Working puzzles.  Has caretaker who helps with ADLs.  Bowels are doing better now.  No abdominal pain.  No chest pain or sob reported.  Eating.  On 1/2 remeron now before bed.  Wants to hold on making any changes in her medication. Doing well on her current regimen.  Followed by oncology, rheumatology and nephrology.    ROS: See pertinent positives and negatives per HPI.  Past Medical History:  Diagnosis Date  . Family history of adverse reaction to anesthesia    Sister - PONV  . History of chicken pox   . History of  fainting   . Hyperlipidemia   . Hypertension   . MI (myocardial infarction) ([place])   . Motion sickness    circular motion  . Vertigo    no episodes for several months    Past Surgical History:  Procedure Laterality Date  . CARDIAC CATHETERIZATION N/A 12/23/2015   Procedure: Left Heart Cath and Coronary Angiography;  Surgeon: Isaias Cowman, MD;  Location: [place] [facility];  Service: Cardiovascular;  Laterality: N/A;  . CARDIAC CATHETERIZATION N/A 12/23/2015   Procedure: Coronary Stent Intervention;  Surgeon: Isaias Cowman, MD;  Location: [place] [facility];  Service: Cardiovascular;  Laterality: N/A;  . CATARACT EXTRACTION W/PHACO Right 07/27/2017   Procedure: CATARACT EXTRACTION PHACO AND INTRAOCULAR LENS PLACEMENT ([place]) RIGHT SYMFONY LENS;  Surgeon: Leandrew Koyanagi, MD;  Location: [place];  Service: Ophthalmology;  Laterality: Right;  . CATARACT EXTRACTION W/PHACO Left 08/10/2017   Procedure: CATARACT EXTRACTION PHACO AND INTRAOCULAR LENS PLACEMENT ([place]) LEFT SYMFONY TORIC LENS;  Surgeon: Leandrew Koyanagi, MD;  Location: [place];  Service: Ophthalmology;  Laterality: Left;  . COLONOSCOPY      Family History  Problem Relation Age of Onset  . Hyperlipidemia Mother   . Heart disease Mother   . Cancer Father        lung  . Sudden death Brother   . Arthritis Maternal Grandmother     SOCIAL HX: reviewed.    Current Outpatient Medications:  .  Cholecalciferol (VITAMIN D-1000 MAX ST) 25 MCG (  1000 UT) tablet, Take 1,000 Units by mouth daily. , Disp: , Rfl:  .  ELIQUIS 2.5 MG TABS tablet, TAKE 1 TABLET VIA PEG TUBE 2 TIMES DAILYFOR AFIB, Disp: 60 tablet, Rfl: 1 .  Melatonin 5 MG TBDP, Take 3 mg by mouth every evening. , Disp: , Rfl:  .  metoprolol tartrate (LOPRESSOR) 25 MG tablet, TAKE 1/4 TABLET VIA G TUBE 2 TIMES DAILYFOR AFIB, Disp: 15 tablet, Rfl: 1 .  mirtazapine (REMERON) 30 MG tablet, Take 30 mg by mouth at bedtime., Disp: , Rfl:   .  mycophenolate (CELLCEPT) 500 MG tablet, Take 500 mg by mouth 2 (two) times daily. , Disp: , Rfl:  .  sevelamer carbonate (RENVELA) 800 MG tablet, Take 800 mg by mouth at bedtime., Disp: , Rfl:  .  vitamin C (ASCORBIC ACID) 250 MG tablet, Take 250 mg by mouth daily., Disp: , Rfl:   EXAM:  GENERAL: alert, oriented, appears well and in no acute distress  HEENT: atraumatic, conjunttiva clear, no obvious abnormalities on inspection of external nose and ears  NECK: normal movements of the head and neck  LUNGS: on inspection no signs of respiratory distress, breathing rate appears normal, no obvious gross SOB, gasping or wheezing  CV: no obvious cyanosis  PSYCH/NEURO: pleasant and cooperative, no obvious depression or anxiety, speech and thought processing grossly intact  ASSESSMENT AND PLAN:  Discussed the following assessment and plan:  Stress Increased stress with medical issues, etc.  On 1/2 remeron q hs. Doing well on this medication.  Wants to hold on changing medication.  Follow.    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   Normocytic anemia Saw hematology.  hgb stable.  Recommended continuing to follow.  If persistent decrease, will require further w/up.    Necrotizing myopathy Treated with IVIG.  Has improved.  Continue exercises.    Hypercholesterolemia Off lipitor.  Low cholesterol diet and exercise.  Follow lipid panel and liver function tests.    Essential (primary) hypertension On metoprolol.  Follow pressures.  Follow metabolic panel.   Aortic atherosclerosis ([place]) Off lipitor now per specialist.      I discussed the assessment and treatment plan with the patient. The patient was provided an opportunity to ask questions and all were answered. The patient agreed with the plan and demonstrated an understanding of the instructions.   The patient was advised to call back or seek an in-person evaluation if the symptoms  worsen or if the condition fails to improve as anticipated.   Einar Pheasant, MD

## 2020-04-13 ENCOUNTER — Encounter: Payer: Self-pay | Admitting: Internal Medicine

## 2020-04-13 NOTE — Assessment & Plan Note (Signed)
Increased stress with medical issues, etc.  On 1/2 remeron q hs. Doing well on this medication.  Wants to hold on changing medication.  Follow.

## 2020-04-13 NOTE — Assessment & Plan Note (Signed)
Saw hematology.  hgb stable.  Recommended continuing to follow.  If persistent decrease, will require further w/up.

## 2020-04-13 NOTE — Assessment & Plan Note (Signed)
Off lipitor.  Low cholesterol diet and exercise.  Follow lipid panel and liver function tests.

## 2020-04-13 NOTE — Assessment & Plan Note (Signed)
Off lipitor now per specialist.

## 2020-04-13 NOTE — Assessment & Plan Note (Signed)
On metoprolol.  Follow pressures.  Follow metabolic panel.  

## 2020-04-13 NOTE — Assessment & Plan Note (Signed)
Treated with IVIG.  Has improved.  Continue exercises.

## 2020-04-13 NOTE — Assessment & Plan Note (Signed)
Follow by nephrology.

## 2020-06-03 ENCOUNTER — Other Ambulatory Visit: Payer: Self-pay | Admitting: Internal Medicine

## 2020-07-01 ENCOUNTER — Telehealth: Payer: Self-pay

## 2020-07-01 ENCOUNTER — Other Ambulatory Visit: Payer: Self-pay | Admitting: Internal Medicine

## 2020-07-01 IMAGING — US US BIOPSY
1 series · 14 of 16 positions shown · non-contrast
Comparison: none

[Series 1: us biopsy · 14 of 16 slices shown]
[im 1/16]
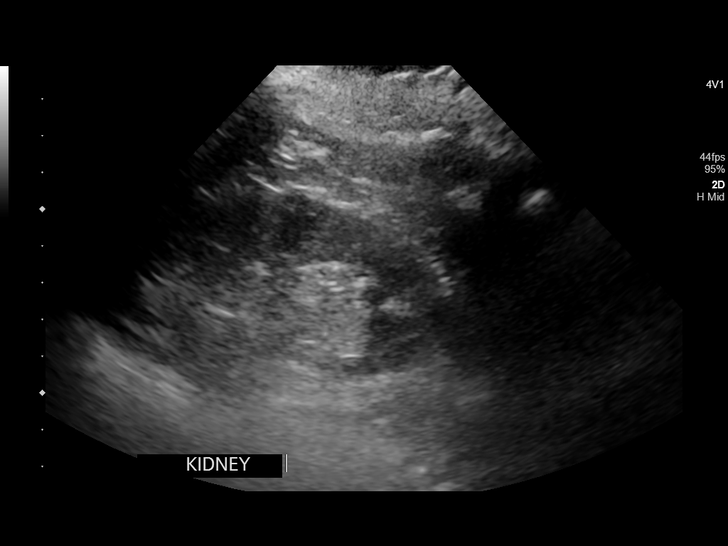
[im 2/16]
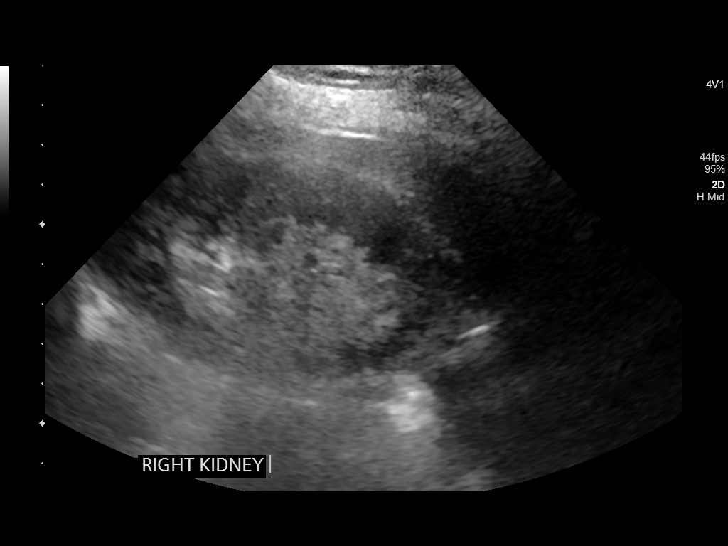
[im 3/16]
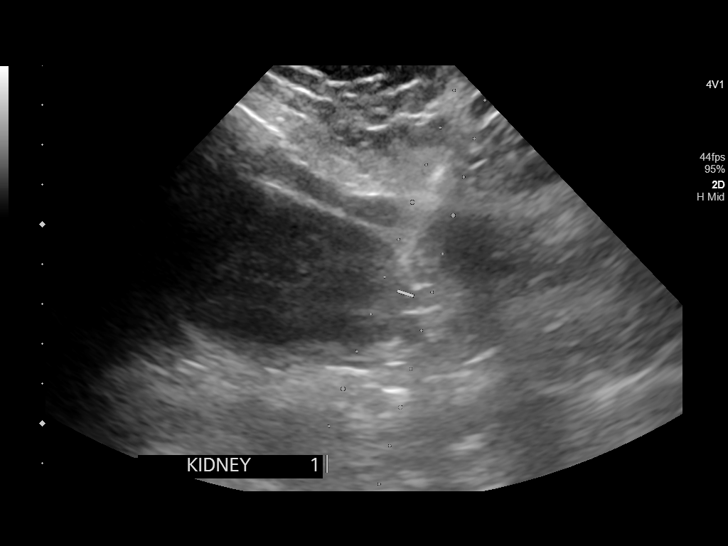
[im 5/16]
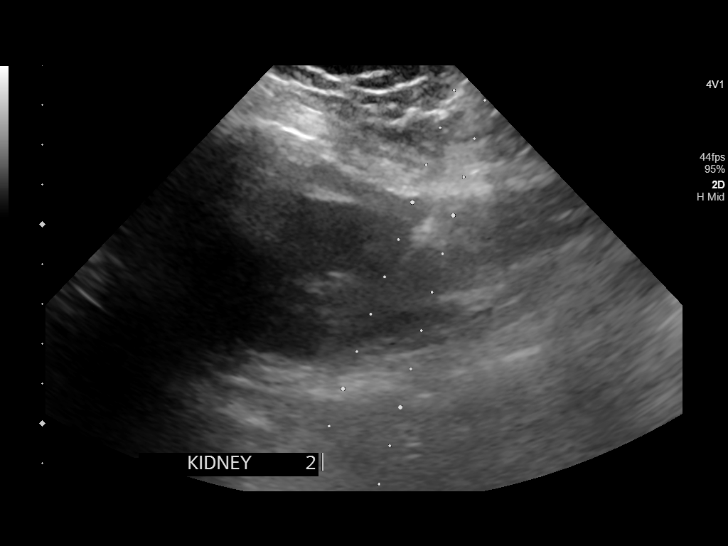
[im 6/16]
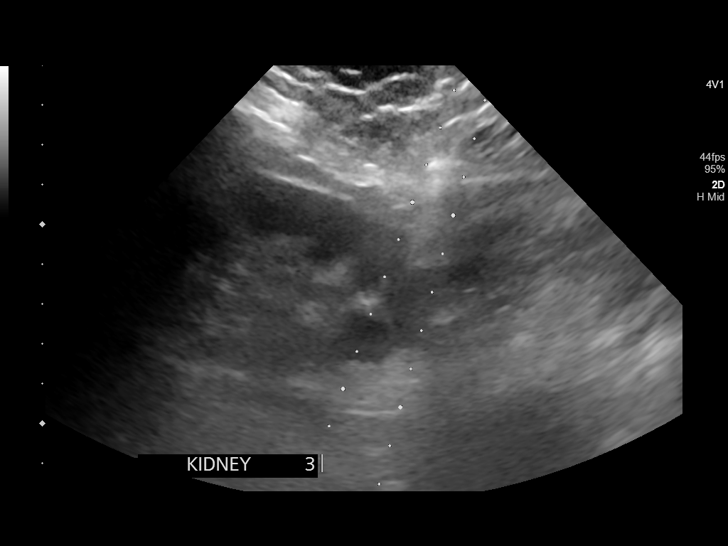
[im 7/16]
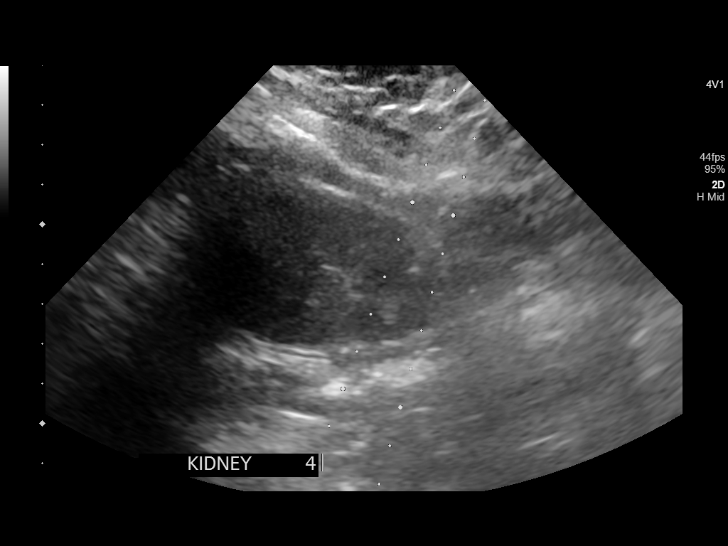
[im 8/16]
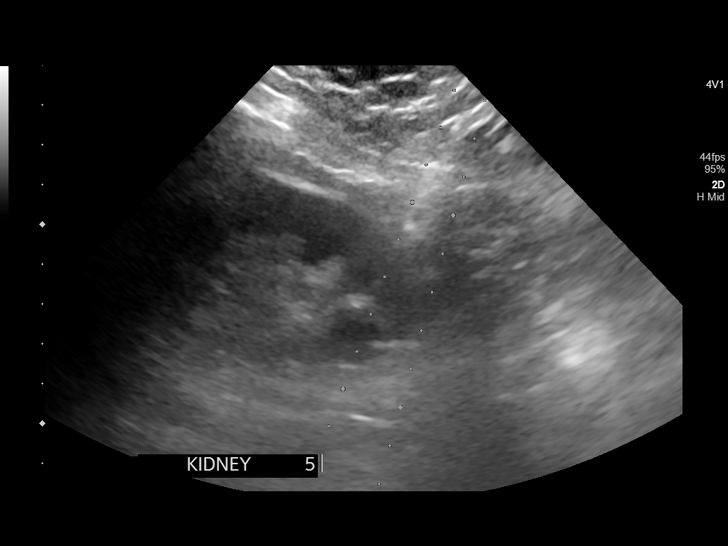
[im 9/16]
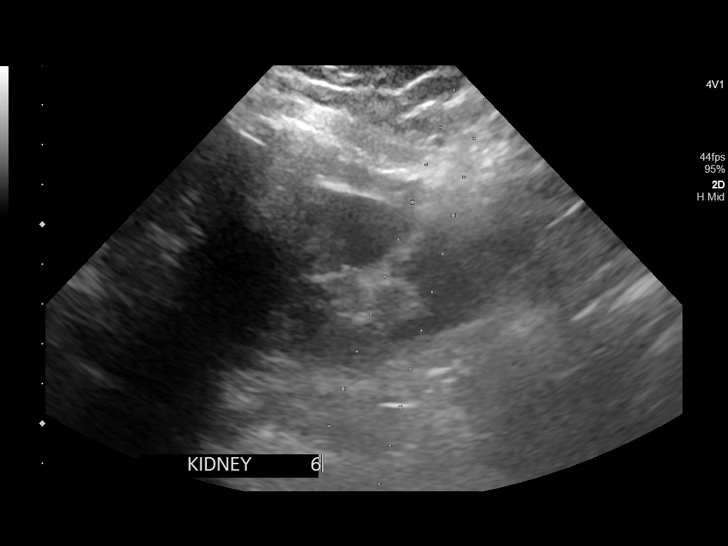
[im 10/16]
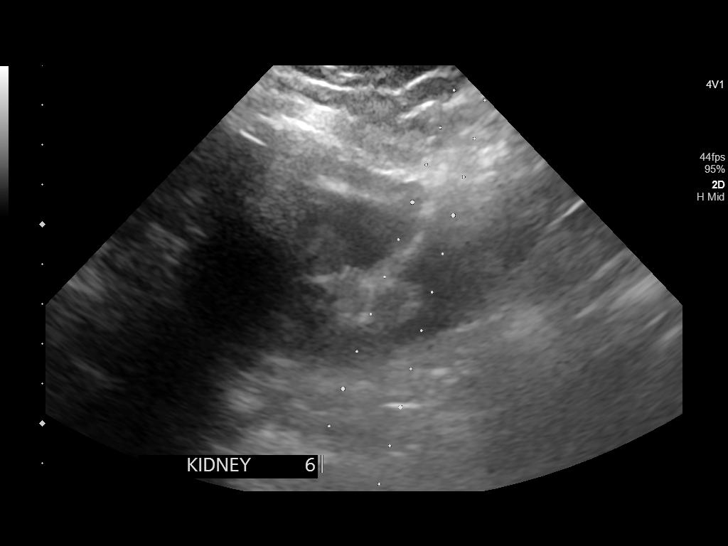
[im 11/16]
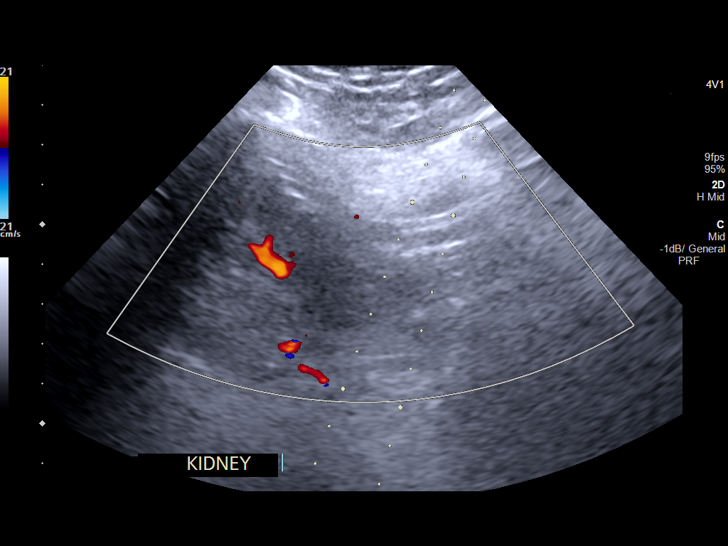
[im 13/16]
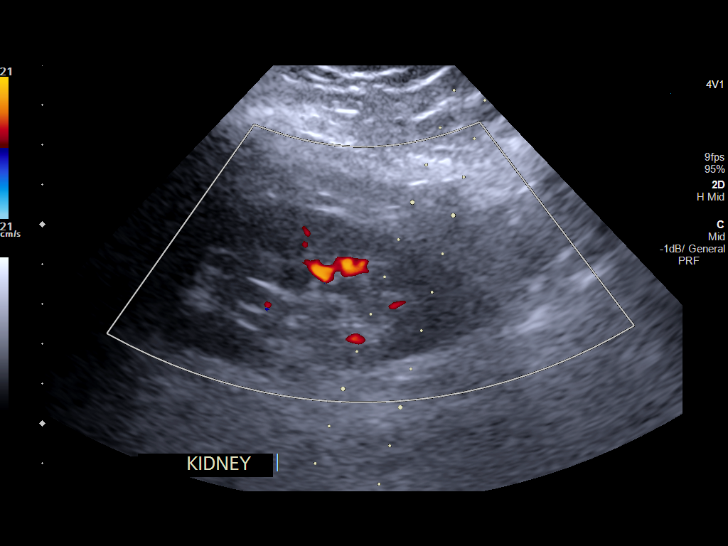
[im 14/16]
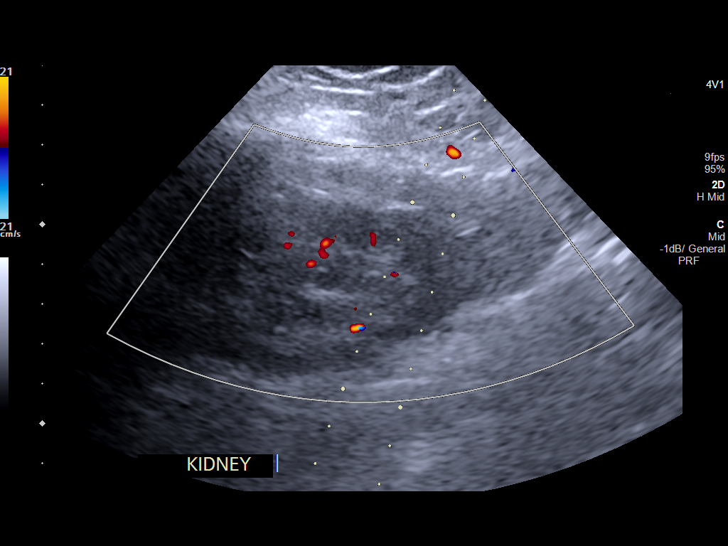
[im 15/16]
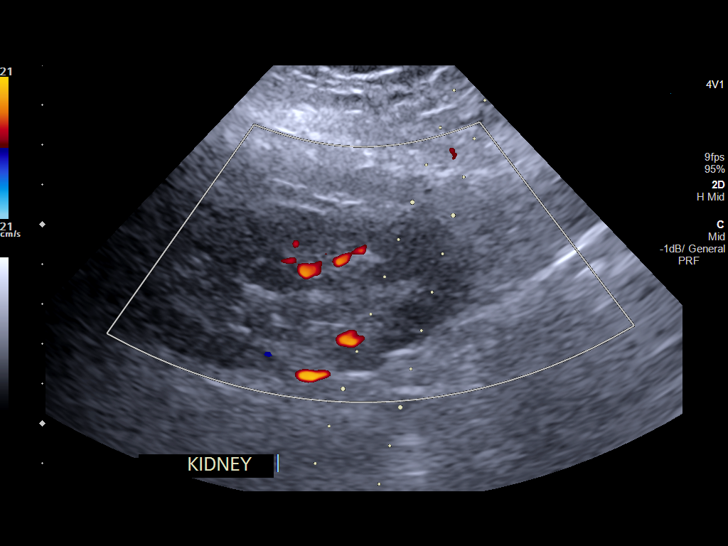
[im 16/16]
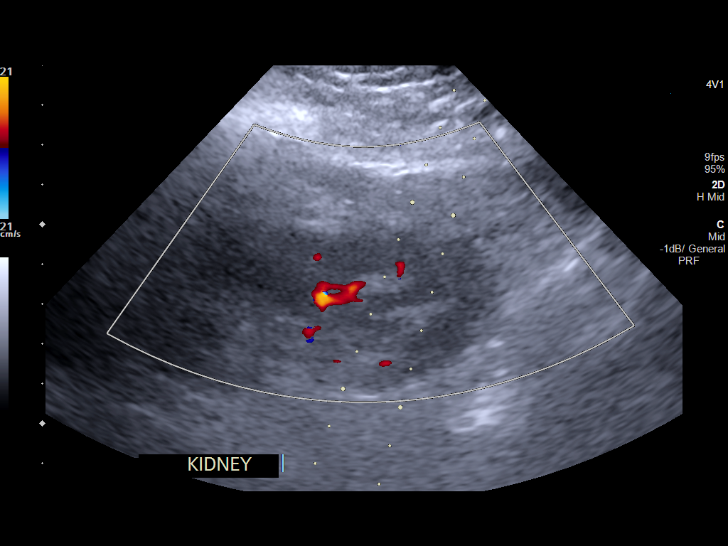

[14 of 16 positions shown; findings below may reference images not displayed]

Canned report from images found in remote index.

Refer to host system for actual result text.

## 2020-07-01 NOTE — Telephone Encounter (Signed)
I received forms on Friday 10/15. I have completed most of form. Placed in your folder for review. Can schedule patient for appt to go over if needed.

## 2020-07-01 NOTE — Telephone Encounter (Signed)
Corene Cornea from [employer] is checking status of Long-Term Care form faxed on 06/13/20? It is called the attending physician statement. They will fax the form again but would like a call.

## 2020-07-02 NOTE — Telephone Encounter (Signed)
Company aware.

## 2020-07-02 NOTE — Telephone Encounter (Signed)
Yes, please schedule appt to discuss forms, etc.  Also please notify company we are in the process of getting this completed. Per his message, they are needing a call back.

## 2020-07-02 NOTE — Telephone Encounter (Signed)
Pt scheduled  

## 2020-07-03 ENCOUNTER — Telehealth (INDEPENDENT_AMBULATORY_CARE_PROVIDER_SITE_OTHER): Payer: 59 | Admitting: Internal Medicine

## 2020-07-03 ENCOUNTER — Encounter: Payer: Self-pay | Admitting: Internal Medicine

## 2020-07-03 ENCOUNTER — Other Ambulatory Visit: Payer: Self-pay

## 2020-07-03 DIAGNOSIS — G7289 Other specified myopathies: Secondary | ICD-10-CM

## 2020-07-03 DIAGNOSIS — I10 Essential (primary) hypertension: Secondary | ICD-10-CM | POA: Diagnosis not present

## 2020-07-03 DIAGNOSIS — N019 Rapidly progressive nephritic syndrome with unspecified morphologic changes: Secondary | ICD-10-CM

## 2020-07-03 NOTE — Progress Notes (Signed)
Patient ID: Leah Espinoza, female   DOB: [birth year], 64 y.o.   MRN: [id number]   Virtual Visit via video Note  This visit type was conducted due to national recommendations for restrictions regarding the COVID-19 pandemic (e.g. social distancing).  This format is felt to be most appropriate for this patient at this time.  All issues noted in this document were discussed and addressed.  No physical exam was performed (except for noted visual exam findings with Video Visits).   I connected with [facility] by a video enabled telemedicine application or video and verified that I am speaking with the correct person using two identifiers. Location patient: home Location provider: work Persons participating in the virtual visit: patient, provider and pts caretaker.  The limitations, risks, security and privacy concerns of performing an evaluation and management service by video and the availability of in person appointments have been discussed. It has also been discussed with the patient that there may be a patient responsible charge related to this service. The patient expressed understanding and agreed to proceed.   Reason for visit: work in appt  HPI: Work in appt to f/u regarding long term care - need for form completion.  Has a history of pauci immune glomerulonephritis with necrotizing myopathy.  Previously requiring trach, dialysis, etc.  She continues to require assistance with her ADLs.  Unable to walk.  She is able to raise her arms some and feed herself, but needs help with other ADLs.  Is stiff.  Cannot bend from waist.  Can't make a fist.  Waiting to receive a sit to stand lift.  Legs are stronger, but she is unable to stan without assistance.  Her caretaker does exercises with her.  She is eating. No chest pain or sob.     ROS: See pertinent positives and negatives per HPI.  Past Medical History:  Diagnosis Date   Family history of adverse reaction to anesthesia    Sister - PONV     History of chicken pox    History of fainting    Hyperlipidemia    Hypertension    MI (myocardial infarction) ([place])    Motion sickness    circular motion   Vertigo    no episodes for several months    Past Surgical History:  Procedure Laterality Date   CARDIAC CATHETERIZATION N/A 12/23/2015   Procedure: Left Heart Cath and Coronary Angiography;  Surgeon: Isaias Cowman, MD;  Location: [place] [facility];  Service: Cardiovascular;  Laterality: N/A;   CARDIAC CATHETERIZATION N/A 12/23/2015   Procedure: Coronary Stent Intervention;  Surgeon: Isaias Cowman, MD;  Location: [place] [facility];  Service: Cardiovascular;  Laterality: N/A;   CATARACT EXTRACTION W/PHACO Right 07/27/2017   Procedure: CATARACT EXTRACTION PHACO AND INTRAOCULAR LENS PLACEMENT ([place]) RIGHT SYMFONY LENS;  Surgeon: Leandrew Koyanagi, MD;  Location: [place];  Service: Ophthalmology;  Laterality: Right;   CATARACT EXTRACTION W/PHACO Left 08/10/2017   Procedure: CATARACT EXTRACTION PHACO AND INTRAOCULAR LENS PLACEMENT ([place]) LEFT SYMFONY TORIC LENS;  Surgeon: Leandrew Koyanagi, MD;  Location: [place];  Service: Ophthalmology;  Laterality: Left;   COLONOSCOPY      Family History  Problem Relation Age of Onset   Hyperlipidemia Mother    Heart disease Mother    Cancer Father        lung   Sudden death Brother    Arthritis Maternal Grandmother     SOCIAL HX: reviewed.    Current Outpatient Medications:  Cholecalciferol (VITAMIN D-1000 MAX ST) 25 MCG (1000 UT) tablet, Take 1,000 Units by mouth daily. , Disp: , Rfl:    ELIQUIS 2.5 MG TABS tablet, TAKE 1 TABLET VIA PEG TUBE 2 TIMES DAILYFOR AFIB, Disp: 60 tablet, Rfl: 1   Melatonin 5 MG TBDP, Take 3 mg by mouth every evening. , Disp: , Rfl:    metoprolol tartrate (LOPRESSOR) 25 MG tablet, TAKE 1/4 TABLET VIA G TUBE 2 TIMES DAILYFOR AFIB, Disp: 15 tablet, Rfl: 1   mirtazapine (REMERON) 30 MG tablet,  Take 30 mg by mouth at bedtime., Disp: , Rfl:    mycophenolate (CELLCEPT) 500 MG tablet, Take 500 mg by mouth 2 (two) times daily. , Disp: , Rfl:    sevelamer carbonate (RENVELA) 800 MG tablet, Take 800 mg by mouth at bedtime., Disp: , Rfl:    vitamin C (ASCORBIC ACID) 250 MG tablet, Take 250 mg by mouth daily., Disp: , Rfl:   EXAM:  GENERAL: alert, oriented, appears well and in no acute distress  HEENT: atraumatic, conjunttiva clear, no obvious abnormalities on inspection of external nose and ears  NECK: normal movements of the head and neck  LUNGS: on inspection no signs of respiratory distress, breathing rate appears normal, no obvious gross SOB, gasping or wheezing  CV: no obvious cyanosis  PSYCH/NEURO: pleasant and cooperative, no obvious depression or anxiety, speech and thought processing grossly intact  ASSESSMENT AND PLAN:  Discussed the following assessment and plan:  Problem List Items Addressed This Visit    Pauci-immune RPGN (rapidly progressive glomerulonephritis)    Being followed by nephrology.       Necrotizing myopathy    Treated with IVIG.  Legs feel stronger.  Still limited in doing her ADLs.  Unable to stand without assistance.  Unable to lift her arms fully.  Doing her exercise at home with her caretaker.  Long term care form completed.  Follow.       Essential (primary) hypertension    On metoprolol.  Follow pressures.            I discussed the assessment and treatment plan with the patient. The patient was provided an opportunity to ask questions and all were answered. The patient agreed with the plan and demonstrated an understanding of the instructions.   The patient was advised to call back or seek an in-person evaluation if the symptoms worsen or if the condition fails to improve as anticipated.   Einar Pheasant, MD

## 2020-07-04 NOTE — Telephone Encounter (Signed)
[employer] is following up on forms.

## 2020-07-05 ENCOUNTER — Encounter: Payer: Self-pay | Admitting: Internal Medicine

## 2020-07-05 NOTE — Assessment & Plan Note (Signed)
On metoprolol.  Follow pressures.

## 2020-07-05 NOTE — Assessment & Plan Note (Addendum)
Treated with IVIG.  Legs feel stronger.  Still limited in doing her ADLs.  Unable to stand without assistance.  Unable to lift her arms fully.  Doing her exercise at home with her caretaker.  Long term care form completed.  Follow.

## 2020-07-05 NOTE — Assessment & Plan Note (Signed)
Being followed by nephrology.

## 2020-07-10 NOTE — Telephone Encounter (Signed)
Forms faxed to [employer]

## 2020-07-31 ENCOUNTER — Other Ambulatory Visit: Payer: Self-pay | Admitting: Internal Medicine

## 2020-09-22 ENCOUNTER — Telehealth: Payer: Self-pay | Admitting: *Deleted

## 2020-09-22 NOTE — Telephone Encounter (Signed)
Patient called to set up apt for lab/md with Dr. Rogue Bussing. She previously cnl apts in July due to insurance concerns.  Patient initially elected to have labs drawn on Wednesday, but also requested labs from multiple providers. She wanted Dr. B to order all of her RA labs/workup. Patient then elected to have her [facility] this [facility], so she doesn't need to get stuck but one time. Lab orders are already on the chart.- cbc, metc, ldh  She requested that a video visit be scheduled for 10/03/20. She does not have mychart. I attempted to help her get set up for mychart, but she she declines RN to assist her in setting up mychart at this time.

## 2020-09-25 ENCOUNTER — Other Ambulatory Visit
Admission: RE | Admit: 2020-09-25 | Discharge: 2020-09-25 | Disposition: A | Discharge status: Home / Self Care | Payer: 59 | Source: Ambulatory Visit | Attending: Internal Medicine | Admitting: Internal Medicine

## 2020-09-25 DIAGNOSIS — E7800 Pure hypercholesterolemia, unspecified: Secondary | ICD-10-CM | POA: Diagnosis not present

## 2020-09-25 DIAGNOSIS — D649 Anemia, unspecified: Secondary | ICD-10-CM | POA: Diagnosis present

## 2020-09-25 LAB — CBC WITH DIFFERENTIAL/PLATELET
Abs Immature Granulocytes: 0.02 10*3/uL (ref 0.00–0.07)
Basophils Absolute: 0.1 10*3/uL (ref 0.0–0.1)
Basophils Relative: 1 %
Eosinophils Absolute: 0.2 10*3/uL (ref 0.0–0.5)
Eosinophils Relative: 4 %
HCT: 34.3 % — ABNORMAL LOW (ref 36.0–46.0)
Hemoglobin: 10.8 g/dL — ABNORMAL LOW (ref 12.0–15.0)
Immature Granulocytes: 0 %
Lymphocytes Relative: 35 %
Lymphs Abs: 1.8 10*3/uL (ref 0.7–4.0)
MCH: 29.7 pg (ref 26.0–34.0)
MCHC: 31.5 g/dL (ref 30.0–36.0)
MCV: 94.2 fL (ref 80.0–100.0)
Monocytes Absolute: 0.5 10*3/uL (ref 0.1–1.0)
Monocytes Relative: 10 %
Neutro Abs: 2.7 10*3/uL (ref 1.7–7.7)
Neutrophils Relative %: 50 %
Platelets: 302 10*3/uL (ref 150–400)
RBC: 3.64 MIL/uL — ABNORMAL LOW (ref 3.87–5.11)
RDW: 12.8 % (ref 11.5–15.5)
WBC: 5.3 10*3/uL (ref 4.0–10.5)
nRBC: 0 % (ref 0.0–0.2)

## 2020-09-25 LAB — COMPREHENSIVE METABOLIC PANEL
ALT: 14 U/L (ref 0–44)
AST: 20 U/L (ref 15–41)
Albumin: 3.6 g/dL (ref 3.5–5.0)
Alkaline Phosphatase: 68 U/L (ref 38–126)
Anion gap: 15 (ref 5–15)
BUN: 33 mg/dL — ABNORMAL HIGH (ref 8–23)
CO2: 24 mmol/L (ref 22–32)
Calcium: 9 mg/dL (ref 8.9–10.3)
Chloride: 104 mmol/L (ref 98–111)
Creatinine, Ser: 1.08 mg/dL — ABNORMAL HIGH (ref 0.44–1.00)
GFR, Estimated: 57 mL/min — ABNORMAL LOW (ref >60)
Glucose, Bld: 105 mg/dL — ABNORMAL HIGH (ref 70–99)
Potassium: 4.6 mmol/L (ref 3.5–5.1)
Sodium: 143 mmol/L (ref 135–145)
Total Bilirubin: 0.5 mg/dL (ref 0.3–1.2)
Total Protein: 7.5 g/dL (ref 6.5–8.1)

## 2020-09-25 LAB — LIPID PANEL
Cholesterol: 299 mg/dL — ABNORMAL HIGH (ref 0–200)
HDL: 58 mg/dL (ref >40)
LDL Cholesterol: 213 mg/dL — ABNORMAL HIGH (ref 0–99)
Total CHOL/HDL Ratio: 5.2 RATIO
Triglycerides: 141 mg/dL (ref <150)
VLDL: 28 mg/dL (ref 0–40)

## 2020-09-25 LAB — LACTATE DEHYDROGENASE: LDH: 156 U/L (ref 98–192)

## 2020-10-03 ENCOUNTER — Encounter: Payer: Self-pay | Admitting: Internal Medicine

## 2020-10-03 ENCOUNTER — Inpatient Hospital Stay: Payer: 59 | Attending: Internal Medicine | Admitting: Internal Medicine

## 2020-10-03 ENCOUNTER — Other Ambulatory Visit: Payer: Self-pay | Admitting: Internal Medicine

## 2020-10-03 DIAGNOSIS — D649 Anemia, unspecified: Secondary | ICD-10-CM

## 2020-10-03 NOTE — Assessment & Plan Note (Addendum)
#  Normocytic anemia-etiology unclear; likely secondary to underlying chronic kidney disease/medications-mycophenolate versus others-question rheumatologic.  Hemoglobin today is 10.8 which is improved from 9.8 prior.   #Given the overall stability of the anemia/improvement-I think is reasonable to hold off any further work-up including bone marrow biopsy at this time.   #CKD/glomerulonephritis-as per nephrology [[facility]]; myositis-rheumatology [facility].   # DISPOSITION:[phone]/cell # Follow up in 4 months- MD; labs- cbc/cmp;LDH; haptoglobin-Dr.B

## 2020-10-03 NOTE — Progress Notes (Signed)
I connected with Leah Espinoza on 10/03/20 at  2:45 PM EST by video enabled telemedicine visit and verified that I am speaking with the correct person using two identifiers.  °I discussed the limitations, risks, security and privacy concerns of performing an evaluation and management service by telemedicine and the availability of in-person appointments. I also discussed with the patient that there may be a patient responsible charge related to this service. The patient expressed understanding and agreed to proceed.  ° ° °Other persons participating in the visit and their role in the encounter: RN/medical reconciliation °Patient’s location: home °Provider’s location: office ° °Oncology History  ° No history exists.  ° ° ° °Chief Complaint: anemia ° ° °History of present illness:Leah Espinoza 64 y.o.  female with history of anemia; pauci-immune glomerulonephritis and also myopathy-is here for follow-up of anemia. ° °In the interim patient was evaluated by rheumatology-for her myositis/joint pains. ° °Patient denies any blood in stools or black or stool.  Her appetite is fair.  No weight loss. ° °Observation/objective: None ° °Assessment and plan: °Normocytic anemia °# Normocytic anemia-etiology unclear; likely secondary to underlying chronic kidney disease/medications-mycophenolate versus others-question rheumatologic.  Hemoglobin today is 10.8 which is improved from 9.8 prior.  ° °#Given the overall stability of the anemia/improvement-I think is reasonable to hold off any further work-up including bone marrow biopsy at this time.  ° °#CKD/glomerulonephritis-as per nephrology [[facility]]; myositis-rheumatology [facility]. ° ° °# DISPOSITION:[phone]/cell °# Follow up in 4 months- MD; labs- cbc/cmp;LDH; haptoglobin-Dr.B ° °Follow-up instructions: ° °I discussed the assessment and treatment plan with the patient.  The patient was provided an opportunity to ask questions and all were answered.  The patient agreed with the  plan and demonstrated understanding of instructions. ° °The patient was advised to call back or seek an in person evaluation if the symptoms worsen or if the condition fails to improve as anticipated. ° ° °Dr. Govinda Brahmanday °[facility] °10/03/2020 °3:41 PM °

## 2020-12-03 ENCOUNTER — Other Ambulatory Visit: Payer: Self-pay | Admitting: Internal Medicine

## 2021-01-30 ENCOUNTER — Inpatient Hospital Stay (HOSPITAL_BASED_OUTPATIENT_CLINIC_OR_DEPARTMENT_OTHER): Payer: 59 | Admitting: Oncology

## 2021-01-30 ENCOUNTER — Encounter (INDEPENDENT_AMBULATORY_CARE_PROVIDER_SITE_OTHER): Payer: Self-pay

## 2021-01-30 ENCOUNTER — Encounter: Payer: Self-pay | Admitting: Oncology

## 2021-01-30 ENCOUNTER — Other Ambulatory Visit: Payer: Self-pay

## 2021-01-30 ENCOUNTER — Inpatient Hospital Stay: Payer: 59 | Attending: Internal Medicine

## 2021-01-30 VITALS — BP 146/65 | HR 67 | Temp 97.3°F | Resp 18

## 2021-01-30 DIAGNOSIS — N189 Chronic kidney disease, unspecified: Secondary | ICD-10-CM | POA: Diagnosis not present

## 2021-01-30 DIAGNOSIS — D649 Anemia, unspecified: Secondary | ICD-10-CM

## 2021-01-30 DIAGNOSIS — D589 Hereditary hemolytic anemia, unspecified: Secondary | ICD-10-CM | POA: Insufficient documentation

## 2021-01-30 DIAGNOSIS — N859 Noninflammatory disorder of uterus, unspecified: Secondary | ICD-10-CM | POA: Insufficient documentation

## 2021-01-30 DIAGNOSIS — M609 Myositis, unspecified: Secondary | ICD-10-CM | POA: Insufficient documentation

## 2021-01-30 LAB — COMPREHENSIVE METABOLIC PANEL
ALT: 14 U/L (ref 0–44)
AST: 19 U/L (ref 15–41)
Albumin: 3.8 g/dL (ref 3.5–5.0)
Alkaline Phosphatase: 71 U/L (ref 38–126)
Anion gap: 11 (ref 5–15)
BUN: 35 mg/dL — ABNORMAL HIGH (ref 8–23)
CO2: 23 mmol/L (ref 22–32)
Calcium: 9.1 mg/dL (ref 8.9–10.3)
Chloride: 105 mmol/L (ref 98–111)
Creatinine, Ser: 0.98 mg/dL (ref 0.44–1.00)
GFR, Estimated: >60 mL/min (ref >60)
Glucose, Bld: 103 mg/dL — ABNORMAL HIGH (ref 70–99)
Potassium: 4.7 mmol/L (ref 3.5–5.1)
Sodium: 139 mmol/L (ref 135–145)
Total Bilirubin: 0.5 mg/dL (ref 0.3–1.2)
Total Protein: 7.7 g/dL (ref 6.5–8.1)

## 2021-01-30 LAB — CBC WITH DIFFERENTIAL/PLATELET
Abs Immature Granulocytes: 0.02 10*3/uL (ref 0.00–0.07)
Basophils Absolute: 0.1 10*3/uL (ref 0.0–0.1)
Basophils Relative: 1 %
Eosinophils Absolute: 0.2 10*3/uL (ref 0.0–0.5)
Eosinophils Relative: 2 %
HCT: 36.2 % (ref 36.0–46.0)
Hemoglobin: 11.5 g/dL — ABNORMAL LOW (ref 12.0–15.0)
Immature Granulocytes: 0 %
Lymphocytes Relative: 40 %
Lymphs Abs: 2.5 10*3/uL (ref 0.7–4.0)
MCH: 29.4 pg (ref 26.0–34.0)
MCHC: 31.8 g/dL (ref 30.0–36.0)
MCV: 92.6 fL (ref 80.0–100.0)
Monocytes Absolute: 0.5 10*3/uL (ref 0.1–1.0)
Monocytes Relative: 8 %
Neutro Abs: 3.1 10*3/uL (ref 1.7–7.7)
Neutrophils Relative %: 49 %
Platelets: 324 10*3/uL (ref 150–400)
RBC: 3.91 MIL/uL (ref 3.87–5.11)
RDW: 14.6 % (ref 11.5–15.5)
WBC: 6.3 10*3/uL (ref 4.0–10.5)
nRBC: 0 % (ref 0.0–0.2)

## 2021-01-30 LAB — LACTATE DEHYDROGENASE: LDH: 151 U/L (ref 98–192)

## 2021-01-30 NOTE — Progress Notes (Signed)
[place]  [phone] [phone]  Patient Care Team: Einar Pheasant, MD as PCP - General (Internal Medicine) Cammie Sickle, MD as Consulting Physician (Hematology and Oncology)   Name of the patient: Leah Espinoza  TK:[id number]  1955-11-11   Date of visit: 01/30/2021   Diagnosis- There are no diagnoses linked to this encounter.   Chief complaint/ Reason for visit- Follow up, Normocytic anemia  Interval history- Leah Espinoza presents to the clinic today for anemia follow up. Her last encounter was with Dr. Rogue Bussing on 10/03/20, via telephone. Hemoglobin was stable at this time, and found to be 10.8. Today her hemoglobin is 11.5, and her kidney function is improved.   She denies recent hospitalizations/recent falls.   Her caregiver is present with her today.   ECOG FS:3 - Symptomatic, >50% confined to bed (secondary to Myositis).   Review of systems-  Review of Systems  Constitutional: Positive for malaise/fatigue. Negative for fever and weight loss.  HENT: Negative for congestion and hearing loss.   Eyes: Negative for blurred vision and double vision.  Respiratory: Negative for cough.   Cardiovascular: Negative for chest pain and palpitations.  Gastrointestinal: Negative for abdominal pain, constipation, diarrhea, nausea and vomiting.  Genitourinary: Negative for frequency and urgency.  Skin: Negative for rash.  Neurological: Negative for dizziness, tingling and headaches.  Endo/Heme/Allergies: Does not bruise/bleed easily.  Psychiatric/Behavioral: Negative for depression. The patient is not nervous/anxious and does not have insomnia.    Current treatment- None.  No Known Allergies   Past Medical History:  Diagnosis Date  . Family history of adverse reaction to anesthesia    Sister - PONV  . History of chicken pox   . History of fainting   . Hyperlipidemia   . Hypertension   . MI (myocardial infarction)  ([place])   . Motion sickness    circular motion  . Vertigo    no episodes for several months     Past Surgical History:  Procedure Laterality Date  . CARDIAC CATHETERIZATION N/A 12/23/2015   Procedure: Left Heart Cath and Coronary Angiography;  Surgeon: Isaias Cowman, MD;  Location: [place] [facility];  Service: Cardiovascular;  Laterality: N/A;  . CARDIAC CATHETERIZATION N/A 12/23/2015   Procedure: Coronary Stent Intervention;  Surgeon: Isaias Cowman, MD;  Location: [place] [facility];  Service: Cardiovascular;  Laterality: N/A;  . CATARACT EXTRACTION W/PHACO Right 07/27/2017   Procedure: CATARACT EXTRACTION PHACO AND INTRAOCULAR LENS PLACEMENT ([place]) RIGHT SYMFONY LENS;  Surgeon: Leandrew Koyanagi, MD;  Location: [place];  Service: Ophthalmology;  Laterality: Right;  . CATARACT EXTRACTION W/PHACO Left 08/10/2017   Procedure: CATARACT EXTRACTION PHACO AND INTRAOCULAR LENS PLACEMENT ([place]) LEFT SYMFONY TORIC LENS;  Surgeon: Leandrew Koyanagi, MD;  Location: [place];  Service: Ophthalmology;  Laterality: Left;  . COLONOSCOPY      Social History   Socioeconomic History  . Marital status: Married    Spouse name: Not on file  . Number of children: Not on file  . Years of education: Not on file  . Highest education level: Not on file  Occupational History  . Not on file  Tobacco Use  . Smoking status: Former Research scientist (life sciences)  . Smokeless tobacco: Never Used  . Tobacco comment: smoked for approx 1 yr around age 52  Vaping Use  . Vaping Use: Never used  Substance and Sexual Activity  . Alcohol use: No    Alcohol/week: 0.0 standard drinks  .  Drug use: No  . Sexual activity: Not on file  Other Topics Concern  . Not on file  Social History Narrative  . Not on file   Social Determinants of Health   Financial Resource Strain: Not on file  Food Insecurity: Not on file  Transportation Needs: Not on file  Physical Activity: Not on file  Stress: Not  on file  Social Connections: Not on file  Intimate Partner Violence: Not on file    Family History  Problem Relation Age of Onset  . Hyperlipidemia Mother   . Heart disease Mother   . Cancer Father        lung  . Sudden death Brother   . Arthritis Maternal Grandmother      Current Outpatient Medications:  .  Cholecalciferol (VITAMIN D-1000 MAX ST) 25 MCG (1000 UT) tablet, Take 1,000 Units by mouth daily. , Disp: , Rfl:  .  ELIQUIS 2.5 MG TABS tablet, TAKE 1 TABLET VIA PEG TUBE 2 TIMES DAILYFOR AFIB, Disp: 60 tablet, Rfl: 1 .  Melatonin 5 MG TBDP, Take 5 mg by mouth at bedtime., Disp: , Rfl:  .  metoprolol tartrate (LOPRESSOR) 25 MG tablet, TAKE 1/4 TABLET VIA G TUBE 2 TIMES DAILYFOR AFIB, Disp: 15 tablet, Rfl: 1 .  mirtazapine (REMERON) 30 MG tablet, Take 30 mg by mouth at bedtime., Disp: , Rfl:  .  mycophenolate (CELLCEPT) 500 MG tablet, Take 500 mg by mouth 2 (two) times daily. , Disp: , Rfl:  .  sevelamer carbonate (RENVELA) 800 MG tablet, Take 800 mg by mouth at bedtime., Disp: , Rfl:  .  vitamin C (ASCORBIC ACID) 250 MG tablet, Take 250 mg by mouth daily., Disp: , Rfl:   Physical exam:  Vitals:   01/30/21 1318  BP: (!) 146/65  Pulse: 67  Resp: 18  Temp: (!) 97.3 F (36.3 C)  SpO2: 100%   Physical Exam Vitals (patient in the wheelchair) and nursing note reviewed.  HENT:     Head: Normocephalic and atraumatic.     Nose: No congestion.     Mouth/Throat:     Mouth: Mucous membranes are moist.     Pharynx: Oropharynx is clear.  Eyes:     General:        Right eye: No discharge.        Left eye: No discharge.     Extraocular Movements: Extraocular movements intact.     Pupils: Pupils are equal, round, and reactive to light.  Cardiovascular:     Rate and Rhythm: Normal rate and regular rhythm.     Pulses: Normal pulses.     Heart sounds: No murmur heard.   Pulmonary:     Effort: Pulmonary effort is normal.  Abdominal:     General: Bowel sounds are normal.  There is no distension.     Tenderness: There is no abdominal tenderness. There is no guarding.  Musculoskeletal:        General: No swelling or deformity.     Right upper arm: Normal.     Left upper arm: Normal.     Right lower leg: No edema.     Left lower leg: No edema.     Comments: Full ROM with focal/muscle weakness.   Skin:    General: Skin is warm and dry.     Capillary Refill: Capillary refill takes less than 2 seconds.  Neurological:     Mental Status: She is alert and oriented to person, place, and time.  Psychiatric:        Mood and Affect: Mood normal.        Behavior: Behavior is cooperative.        Thought Content: Thought content normal.     CMP Latest Ref Rng & Units 01/30/2021  Glucose 70 - 99 mg/dL 103(H)  BUN 8 - 23 mg/dL 35(H)  Creatinine 0.44 - 1.00 mg/dL 0.98  Sodium 135 - 145 mmol/L 139  Potassium 3.5 - 5.1 mmol/L 4.7  Chloride 98 - 111 mmol/L 105  CO2 22 - 32 mmol/L 23  Calcium 8.9 - 10.3 mg/dL 9.1  Total Protein 6.5 - 8.1 g/dL 7.7  Total Bilirubin 0.3 - 1.2 mg/dL 0.5  Alkaline Phos 38 - 126 U/L 71  AST 15 - 41 U/L 19  ALT 0 - 44 U/L 14   CBC Latest Ref Rng & Units 01/30/2021  WBC 4.0 - 10.5 K/uL 6.3  Hemoglobin 12.0 - 15.0 g/dL 11.5(L)  Hematocrit 36.0 - 46.0 % 36.2  Platelets 150 - 400 K/uL 324    No images are attached to the encounter.  No results found.   Assessment and plan- Patient is a 65 y.o. female with normocytic anemia.    Visit Diagnosis 1. Normocytic anemia    Normocytic anemia -Likely secondary to CKD, and CKD now resolving -HGB today is 11.5, previously 10.8 (09/25/20).  -HCT 36.2 (normal) -MCV 92.6  Hemolytic anemia -LDH: Followed, normal, today 151 U/L -Haptoglobin: pending  CKD/Glomerulonephritis -following with Nephrology [facility])  -sees every 6 months -BUN slightly elevated at 35 -Creatinine normal today 0.98 -GFR >60 today  Myositis -following with Rheumatology [facility]) -Taking cellcept -Statin drug  stopped by Rheumatology Jan 2022.   Dispostion: -Follow up in 6 months MD assessment/labs  -CBC/CMP -Call your cardiologist to see them in the next 3 months.    Thank you for allowing me to participate in the care of this very pleasant patient.   The patient's diagnosis, an outline of the further diagnostic and laboratory studies which will be required, the recommendation for surgery, and alternatives were discussed with her and her accompanying family members.  All questions were answered to their satisfaction.  I personally had a face to face interaction and evaluated the patient jointly with the NP Student, Mrs. Benedetto Goad.  I have reviewed her history and available records and have performed the key portions of the physical exam including general, HEENT, abdominal exam, pelvic exam with my findings confirming those documented above by the APP student.  I have discussed the case with the APP student and the patient.  I agree with the above documentation, assessment and plan which was fully formulated by me.  Counseling was completed by me.    Benedetto Goad, Student FNP

## 2021-01-31 LAB — HAPTOGLOBIN: Haptoglobin: 299 mg/dL (ref 37–355)

## 2021-02-06 ENCOUNTER — Other Ambulatory Visit: Payer: Self-pay | Admitting: Internal Medicine

## 2021-03-11 ENCOUNTER — Telehealth: Payer: Self-pay

## 2021-03-11 ENCOUNTER — Other Ambulatory Visit: Payer: Self-pay | Admitting: Internal Medicine

## 2021-03-11 NOTE — Telephone Encounter (Signed)
Pt's daughter, Olivia Mackie, reached out about pt being sick for about two weeks. She stated that pt started out with sinus symptoms and they think she had covid. Pt is now having symptoms of fatigue, loss of appetite, vomiting and diarrhea. Olivia Mackie was wanting to know if we had someone that could come out to pt's home and draw blood to make sure pt was not dehydrated. I let her know that we do not. I also spoke with Dr. Nicki Reaper verbally about this and she recommended that pt be evaluated, that we could placed an order so that she could get help with transportation or if she wanted labs ordered we could order them to be done at [facility]. I informed Olivia Mackie of this and she stated that pt did not want to do that at this time and would take some Mucinex for now. Dr. Nicki Reaper was informed of this. Dr. Nicki Reaper stated that we could offer her a virtual visit for tomorrow at 3pm. Pt accepted and has been scheduled.

## 2021-03-12 ENCOUNTER — Telehealth (INDEPENDENT_AMBULATORY_CARE_PROVIDER_SITE_OTHER): Payer: 59 | Admitting: Internal Medicine

## 2021-03-12 ENCOUNTER — Encounter: Payer: Self-pay | Admitting: Internal Medicine

## 2021-03-12 DIAGNOSIS — N019 Rapidly progressive nephritic syndrome with unspecified morphologic changes: Secondary | ICD-10-CM

## 2021-03-12 DIAGNOSIS — I700 Atherosclerosis of aorta: Secondary | ICD-10-CM

## 2021-03-12 DIAGNOSIS — G7289 Other specified myopathies: Secondary | ICD-10-CM

## 2021-03-12 DIAGNOSIS — E7800 Pure hypercholesterolemia, unspecified: Secondary | ICD-10-CM

## 2021-03-12 DIAGNOSIS — R059 Cough, unspecified: Secondary | ICD-10-CM

## 2021-03-12 DIAGNOSIS — F439 Reaction to severe stress, unspecified: Secondary | ICD-10-CM

## 2021-03-12 DIAGNOSIS — Z8371 Family history of colonic polyps: Secondary | ICD-10-CM

## 2021-03-12 DIAGNOSIS — I10 Essential (primary) hypertension: Secondary | ICD-10-CM | POA: Diagnosis not present

## 2021-03-12 MED ORDER — ONDANSETRON 4 MG PO TBDP
4.0000 mg | ORAL_TABLET | Freq: Two times a day (BID) | ORAL | 0 refills | Status: DC | PRN
Start: 2021-03-12 — End: 2021-05-28

## 2021-03-12 NOTE — Progress Notes (Signed)
Patient ID: Leah Espinoza, female   DOB: Apr 04, 1956, 65 y.o.   MRN: [id number]   Virtual Visit via video Note  This visit type was conducted due to national recommendations for restrictions regarding the COVID-19 pandemic (e.g. social distancing).  This format is felt to be most appropriate for this patient at this time.  All issues noted in this document were discussed and addressed.  No physical exam was performed (except for noted visual exam findings with Video Visits).   I connected with [facility] by a video enabled telemedicine application and verified that I am speaking with the correct person using two identifiers. Location patient: home Location provider: work Persons participating in the virtual visit: patient, provider  The limitations, risks, security and privacy concerns of performing an evaluation and management service by video and the availability of in person appointments have been discussed.  It has also been discussed with the patient that there may be a patient responsible charge related to this service. The patient expressed understanding and agreed to proceed.   Reason for visit: work in appt  HPI: Work in appt.  Went to a cook out approximately one month ago.  Exposed to covid several weeks ago.  Reports increased drainage - clear.  Felt sick on her stomach.  Decreased appetite.  Previous emesis.  Some diarrhea at times.  Last few days ago.  Nothing taste right.  Occasional abdominal discomfort.  No fever.  No headache.  No sinus pressure.  Previous sore throat - intermittent.  Nasal congestion - productive.  No chest congestion, chest pain or sob.  Some cough.  Ate cereal today and 1/2 tomato sandwich and 1/2 cheese toast.  Discussed staying hydrated.     ROS: See pertinent positives and negatives per HPI.  Past Medical History:  Diagnosis Date   Family history of adverse reaction to anesthesia    Sister - PONV   History of chicken pox    History of fainting     Hyperlipidemia    Hypertension    MI (myocardial infarction) ([place])    Motion sickness    circular motion   Vertigo    no episodes for several months    Past Surgical History:  Procedure Laterality Date   CARDIAC CATHETERIZATION N/A 12/23/2015   Procedure: Left Heart Cath and Coronary Angiography;  Surgeon: Isaias Cowman, MD;  Location: [place] [facility];  Service: Cardiovascular;  Laterality: N/A;   CARDIAC CATHETERIZATION N/A 12/23/2015   Procedure: Coronary Stent Intervention;  Surgeon: Isaias Cowman, MD;  Location: [place] [facility];  Service: Cardiovascular;  Laterality: N/A;   CATARACT EXTRACTION W/PHACO Right 07/27/2017   Procedure: CATARACT EXTRACTION PHACO AND INTRAOCULAR LENS PLACEMENT ([place]) RIGHT SYMFONY LENS;  Surgeon: Leandrew Koyanagi, MD;  Location: [place];  Service: Ophthalmology;  Laterality: Right;   CATARACT EXTRACTION W/PHACO Left 08/10/2017   Procedure: CATARACT EXTRACTION PHACO AND INTRAOCULAR LENS PLACEMENT ([place]) LEFT SYMFONY TORIC LENS;  Surgeon: Leandrew Koyanagi, MD;  Location: [place];  Service: Ophthalmology;  Laterality: Left;   COLONOSCOPY      Family History  Problem Relation Age of Onset   Hyperlipidemia Mother    Heart disease Mother    Cancer Father        lung   Sudden death Brother    Arthritis Maternal Grandmother     SOCIAL HX: reviewed.    Current Outpatient Medications:    Cholecalciferol (VITAMIN D-1000 MAX ST) 25 MCG (1000 UT) tablet, Take 1,000  Units by mouth daily. , Disp: , Rfl:    ELIQUIS 2.5 MG TABS tablet, TAKE 1 TABLET VIA PEG TUBE 2 TIMES DAILYFOR AFIB, Disp: 60 tablet, Rfl: 1   Melatonin 5 MG TBDP, Take 5 mg by mouth at bedtime., Disp: , Rfl:    metoprolol tartrate (LOPRESSOR) 25 MG tablet, TAKE 1/4 TABLET VIA G TUBE 2 TIMES DAILYFOR AFIB, Disp: 15 tablet, Rfl: 1   mirtazapine (REMERON) 30 MG tablet, Take 30 mg by mouth at bedtime., Disp: , Rfl:    mycophenolate (CELLCEPT) 500 MG  tablet, Take 500 mg by mouth 2 (two) times daily. , Disp: , Rfl:    ondansetron (ZOFRAN ODT) 4 MG disintegrating tablet, Take 1 tablet (4 mg total) by mouth 2 (two) times daily as needed for nausea or vomiting., Disp: 20 tablet, Rfl: 0   vitamin C (ASCORBIC ACID) 250 MG tablet, Take 250 mg by mouth daily., Disp: , Rfl:   EXAM:  GENERAL: alert, oriented, appears well and in no acute distress  HEENT: atraumatic, conjunttiva clear, no obvious abnormalities on inspection of external nose and ears  NECK: normal movements of the head and neck  LUNGS: on inspection no signs of respiratory distress, breathing rate appears normal, no obvious gross SOB, gasping or wheezing  CV: no obvious cyanosis  PSYCH/NEURO: pleasant and cooperative, no obvious depression or anxiety, speech and thought processing grossly intact  ASSESSMENT AND PLAN:  Discussed the following assessment and plan:  Problem List Items Addressed This Visit     Aortic atherosclerosis ([place])    Off lipitor given history.        Cough    Increased nasal congestion and cough as outlined.  Continue robitussin/mucinex.  Steroid nasal spray and saline nasal psray as outlined.  zofran if needed.  Delsym if needed for cough. Discussed covid.  Rest.  Follow.  Call with update.        Essential (primary) hypertension    Continue metoprolol.  Follow pressures.        Family history of colonic polyps    Colonoscopy 12/2013.  F/u in 10 years.        Hypercholesterolemia    Low cholesterol diet and exercise  Follow lipid panel.        Necrotizing myopathy    Treated with IVIG previously.  Stable.        Pauci-immune RPGN (rapidly progressive glomerulonephritis)    Being followed by nephrology.        Stress    Overall stable.  Follow.         Return if symptoms worsen or fail to improve.   I discussed the assessment and treatment plan with the patient. The patient was provided an opportunity to ask questions  and all were answered. The patient agreed with the plan and demonstrated an understanding of the instructions.   The patient was advised to call back or seek an in-person evaluation if the symptoms worsen or if the condition fails to improve as anticipated.   Einar Pheasant, MD

## 2021-03-16 ENCOUNTER — Encounter: Payer: Self-pay | Admitting: Internal Medicine

## 2021-03-16 NOTE — Assessment & Plan Note (Signed)
Colonoscopy 12/2013.  F/u in 10 years.  

## 2021-03-16 NOTE — Assessment & Plan Note (Signed)
Low cholesterol diet and exercise.  Follow lipid panel.   

## 2021-03-16 NOTE — Assessment & Plan Note (Signed)
Treated with IVIG previously.  Stable.

## 2021-03-16 NOTE — Assessment & Plan Note (Signed)
Continue metoprolol.  Follow pressures.   

## 2021-03-16 NOTE — Assessment & Plan Note (Signed)
Overall stable.  Follow.

## 2021-03-16 NOTE — Assessment & Plan Note (Signed)
Off lipitor given history.

## 2021-03-16 NOTE — Assessment & Plan Note (Signed)
Being followed by nephrology.

## 2021-03-16 NOTE — Assessment & Plan Note (Signed)
Increased nasal congestion and cough as outlined.  Continue robitussin/mucinex.  Steroid nasal spray and saline nasal psray as outlined.  zofran if needed.  Delsym if needed for cough. Discussed covid.  Rest.  Follow.  Call with update.

## 2021-04-10 ENCOUNTER — Other Ambulatory Visit: Payer: Self-pay | Admitting: Internal Medicine

## 2021-05-13 HISTORY — PX: MELANOMA EXCISION: SHX5266

## 2021-05-15 ENCOUNTER — Telehealth: Payer: Self-pay | Admitting: Internal Medicine

## 2021-05-15 NOTE — Telephone Encounter (Signed)
Tanner from [employer] is calling to check on the status of a form they faxed over on 05/01/21.Please call him at [phone].

## 2021-05-20 NOTE — Telephone Encounter (Signed)
Is this the same form we have completed - just an extension. If so, then unless they require another visit, can complete form without.  If new form and new information needed, will need appt.

## 2021-05-20 NOTE — Telephone Encounter (Signed)
We completed [employer] forms for her 07/2020. We saw her virtually 03/12/21 (work in appt). Do you want me to schedule her to see you in order to complete paperwork? Paperwork requires ADL functional status. Is not noted on form that visit is required in order to complete.

## 2021-05-20 NOTE — Telephone Encounter (Signed)
Form is the same. Will complete and place in folder for signature

## 2021-05-22 ENCOUNTER — Telehealth: Payer: Self-pay | Admitting: Internal Medicine

## 2021-05-22 NOTE — Telephone Encounter (Signed)
Discussed this with you this morning and gave forms to you with most of it completed. Just forwarding to you for documentation purposes.

## 2021-05-22 NOTE — Telephone Encounter (Signed)
Tried to reach tanner. Ext was invalid

## 2021-05-22 NOTE — Telephone Encounter (Signed)
Tanner from [employer] is calling to check on the status of a medical eligibility form faxed over on 9/2.Please call Tanner at [phone] ext [phone].

## 2021-05-22 NOTE — Telephone Encounter (Signed)
Form faxed to [employer]

## 2021-05-22 NOTE — Telephone Encounter (Signed)
Form completed and signed and placed in box.

## 2021-05-22 NOTE — Telephone Encounter (Signed)
See other note

## 2021-05-26 ENCOUNTER — Telehealth: Payer: Self-pay | Admitting: Internal Medicine

## 2021-05-26 NOTE — Telephone Encounter (Signed)
[name] from [place], NP with surgery needs to speak to Dr Nicki Reaper , please call her [phone]. She needs to know if they can hold patient's ELIQUIS 2.5 MG TABS tablet for 72 hours before surgery.

## 2021-05-27 NOTE — Telephone Encounter (Signed)
We prescribe her eliquis. She has a tumor excision scheduled 9/22. Do you want to try to arrange a pre-op with her?

## 2021-05-28 ENCOUNTER — Telehealth (INDEPENDENT_AMBULATORY_CARE_PROVIDER_SITE_OTHER): Payer: 59 | Admitting: Internal Medicine

## 2021-05-28 ENCOUNTER — Encounter: Payer: Self-pay | Admitting: Internal Medicine

## 2021-05-28 ENCOUNTER — Other Ambulatory Visit: Payer: Self-pay

## 2021-05-28 DIAGNOSIS — G7289 Other specified myopathies: Secondary | ICD-10-CM

## 2021-05-28 DIAGNOSIS — I700 Atherosclerosis of aorta: Secondary | ICD-10-CM | POA: Diagnosis not present

## 2021-05-28 DIAGNOSIS — I2510 Atherosclerotic heart disease of native coronary artery without angina pectoris: Secondary | ICD-10-CM | POA: Diagnosis not present

## 2021-05-28 DIAGNOSIS — I252 Old myocardial infarction: Secondary | ICD-10-CM

## 2021-05-28 DIAGNOSIS — I10 Essential (primary) hypertension: Secondary | ICD-10-CM | POA: Diagnosis not present

## 2021-05-28 DIAGNOSIS — N019 Rapidly progressive nephritic syndrome with unspecified morphologic changes: Secondary | ICD-10-CM

## 2021-05-28 DIAGNOSIS — D649 Anemia, unspecified: Secondary | ICD-10-CM

## 2021-05-28 DIAGNOSIS — I4891 Unspecified atrial fibrillation: Secondary | ICD-10-CM

## 2021-05-28 NOTE — Telephone Encounter (Signed)
Tried to call [name] to discuss.  Left message for her.  Given that it has been a while since I have seen her, see if she can do a virtual visit today to discuss.  I was trying to get more detail regarding what exactly the procedure would involve.

## 2021-05-28 NOTE — Progress Notes (Signed)
Patient ID: Leah Espinoza, female   DOB: 1955/10/07, 65 y.o.   MRN: [id number]   Virtual Visit via video Note  This visit type was conducted due to national recommendations for restrictions regarding the COVID-19 pandemic (e.g. social distancing).  This format is felt to be most appropriate for this patient at this time.  All issues noted in this document were discussed and addressed.  No physical exam was performed (except for noted visual exam findings with Video Visits).   I connected with [facility] by a video enabled telemedicine application and verified that I am speaking with the correct person using two identifiers. Location patient: home Location provider: work  Persons participating in the virtual visit: patient, provider  The limitations, risks, security and privacy concerns of performing an evaluation and management service by video and the availability of in person appointments have been discussed.  It has also been discussed with the patient that there may be a patient responsible charge related to this service. The patient expressed understanding and agreed to proceed.   Reason for visit: work in appt  HPI: Work in to discuss upcoming procedure and stopping eliquis.  She has a melanoma on he rleft ar.  Planning to have removed.  Have left message with oncology to clarify what exactly is being done.  She is on eliquis for afib.  States overall she feels things are stable.  Denies any chest pain.  Breathing stable.  Due to her underlying medical issues, she is sedentary.  No cough or congestion.  No acid reflux or abdominal pain.  She did have an episode 2-3 weeks ago where she noticed heart fluttering.  Occurred at night.  Went to sleep.  Next day - resolved.  Has not occurred since.     ROS: See pertinent positives and negatives per HPI.  Past Medical History:  Diagnosis Date   Family history of adverse reaction to anesthesia    Sister - PONV   History of chicken pox     History of fainting    Hyperlipidemia    Hypertension    MI (myocardial infarction) ([place])    Motion sickness    circular motion   Vertigo    no episodes for several months    Past Surgical History:  Procedure Laterality Date   CARDIAC CATHETERIZATION N/A 12/23/2015   Procedure: Left Heart Cath and Coronary Angiography;  Surgeon: Isaias Cowman, MD;  Location: [place] [facility];  Service: Cardiovascular;  Laterality: N/A;   CARDIAC CATHETERIZATION N/A 12/23/2015   Procedure: Coronary Stent Intervention;  Surgeon: Isaias Cowman, MD;  Location: [place] [facility];  Service: Cardiovascular;  Laterality: N/A;   CATARACT EXTRACTION W/PHACO Right 07/27/2017   Procedure: CATARACT EXTRACTION PHACO AND INTRAOCULAR LENS PLACEMENT ([place]) RIGHT SYMFONY LENS;  Surgeon: Leandrew Koyanagi, MD;  Location: [place];  Service: Ophthalmology;  Laterality: Right;   CATARACT EXTRACTION W/PHACO Left 08/10/2017   Procedure: CATARACT EXTRACTION PHACO AND INTRAOCULAR LENS PLACEMENT ([place]) LEFT SYMFONY TORIC LENS;  Surgeon: Leandrew Koyanagi, MD;  Location: [place];  Service: Ophthalmology;  Laterality: Left;   COLONOSCOPY      Family History  Problem Relation Age of Onset   Hyperlipidemia Mother    Heart disease Mother    Cancer Father        lung   Sudden death Brother    Arthritis Maternal Grandmother     SOCIAL HX: reviewed.    Current Outpatient Medications:    Cholecalciferol (VITAMIN  D-1000 MAX ST) 25 MCG (1000 UT) tablet, Take 1,000 Units by mouth daily. , Disp: , Rfl:    ELIQUIS 2.5 MG TABS tablet, TAKE 1 TABLET VIA PEG TUBE 2 TIMES DAILYFOR AFIB, Disp: 60 tablet, Rfl: 1   Melatonin 5 MG TBDP, Take 5 mg by mouth at bedtime., Disp: , Rfl:    metoprolol tartrate (LOPRESSOR) 25 MG tablet, TAKE 1/4 TABLET VIA G TUBE 2 TIMES DAILYFOR AFIB, Disp: 15 tablet, Rfl: 1   mirtazapine (REMERON) 30 MG tablet, Take 30 mg by mouth at bedtime., Disp: , Rfl:     mycophenolate (CELLCEPT) 500 MG tablet, Take 500 mg by mouth 2 (two) times daily. , Disp: , Rfl:    vitamin C (ASCORBIC ACID) 250 MG tablet, Take 250 mg by mouth daily., Disp: , Rfl:   EXAM:  VITALS per patient if applicable: 123XX123, 70  GENERAL: alert, oriented, appears well and in no acute distress  HEENT: atraumatic, conjunttiva clear, no obvious abnormalities on inspection of external nose and ears  NECK: normal movements of the head and neck  LUNGS: on inspection no signs of respiratory distress, breathing rate appears normal, no obvious gross SOB, gasping or wheezing  CV: no obvious cyanosis  PSYCH/NEURO: pleasant and cooperative, no obvious depression or anxiety, speech and thought processing grossly intact  ASSESSMENT AND PLAN:  Discussed the following assessment and plan:  Problem List Items Addressed This Visit     3-vessel CAD    Known CAD.  Has planned procedure as outlined.  Has not seen cardiology since 2020.  No chest pain.  Discussed f/u with cardiology for pre op evaluation.  She is agreeable.        A-fib [facility])    With history of afib. On eliquis.  Surgical oncology - recommended stopping eliquis three days prior to procedure.  Discussed stopping.  Discussed risk of stopping including stroke, etc.  She is agreeable to stop.  Will need to restart as soon as able after procedure.  Had the episode of hear fluttering a few weeks ago.  No chest pain.  No other increased heart racing and palpitations.  Discussed cardiology pre op evaluation.  She is agreeable.       Aortic atherosclerosis ([facility])    Off statin given history.       Essential (primary) hypertension    Continue metoprolol.  Follow pressures. Will need close intra op and post op monitoring of her heart rate and blood pressure to avoid extremes.        History of ST elevation myocardial infarction (STEMI)    Has not seen cardiology since 2020.  Recommend cardiology pre op evaluation by cardiology as  outlined.        Necrotizing myopathy    Previously treated with IVIG.  Stable.  Unable to walk/stand.  Increased use of arms.  Follow.       Normocytic anemia    Felt to be likely related to underlying kidney disease vs possible rheumatologic origin.  CBC check prior to procedure.        Pauci-immune RPGN (rapidly progressive glomerulonephritis)    Followed by nephrology.        Return if symptoms worsen or fail to improve.   I discussed the assessment and treatment plan with the patient. The patient was provided an opportunity to ask questions and all were answered. The patient agreed with the plan and demonstrated an understanding of the instructions.   The patient was advised to call  back or seek an in-person evaluation if the symptoms worsen or if the condition fails to improve as anticipated.    Einar Pheasant, MD

## 2021-05-28 NOTE — Telephone Encounter (Signed)
Pt agreeable to VV at 4:30

## 2021-05-29 ENCOUNTER — Telehealth: Payer: Self-pay | Admitting: Internal Medicine

## 2021-05-29 ENCOUNTER — Encounter: Payer: Self-pay | Admitting: Internal Medicine

## 2021-05-29 DIAGNOSIS — I4891 Unspecified atrial fibrillation: Secondary | ICD-10-CM | POA: Insufficient documentation

## 2021-05-29 NOTE — Assessment & Plan Note (Signed)
With history of afib. On eliquis.  Surgical oncology - recommended stopping eliquis three days prior to procedure.  Discussed stopping.  Discussed risk of stopping including stroke, etc.  She is agreeable to stop.  Will need to restart as soon as able after procedure.  Had the episode of hear fluttering a few weeks ago.  No chest pain.  No other increased heart racing and palpitations.  Discussed cardiology pre op evaluation.  She is agreeable.

## 2021-05-29 NOTE — Assessment & Plan Note (Signed)
Continue metoprolol.  Follow pressures. Will need close intra op and post op monitoring of her heart rate and blood pressure to avoid extremes.

## 2021-05-29 NOTE — Assessment & Plan Note (Signed)
Followed by nephrology. 

## 2021-05-29 NOTE — Assessment & Plan Note (Signed)
Off statin given history.

## 2021-05-29 NOTE — Telephone Encounter (Signed)
Please call and let her know that Dr Saralyn Pilar can see her Monday 06/01/21 at 11:30 for pre op evaluation - prior to her surgery.

## 2021-05-29 NOTE — Assessment & Plan Note (Signed)
Felt to be likely related to underlying kidney disease vs possible rheumatologic origin.  CBC check prior to procedure.

## 2021-05-29 NOTE — Assessment & Plan Note (Signed)
Previously treated with IVIG.  Stable.  Unable to walk/stand.  Increased use of arms.  Follow.

## 2021-05-29 NOTE — Telephone Encounter (Signed)
Patient has been notified already by the other office.Leah Espinoza

## 2021-05-29 NOTE — Assessment & Plan Note (Signed)
Known CAD.  Has planned procedure as outlined.  Has not seen cardiology since 2020.  No chest pain.  Discussed f/u with cardiology for pre op evaluation.  She is agreeable.

## 2021-05-29 NOTE — Assessment & Plan Note (Signed)
Has not seen cardiology since 2020.  Recommend cardiology pre op evaluation by cardiology as outlined.

## 2021-06-02 ENCOUNTER — Telehealth: Payer: Self-pay | Admitting: Internal Medicine

## 2021-06-02 NOTE — Telephone Encounter (Signed)
Patient was evaluated yesterday by [facility] and has stopped her eliquis. Note is in Care Everywhere if you need to review. Advised patient to keep [place] updated and let us know if they need anything.

## 2021-06-02 NOTE — Telephone Encounter (Signed)
Please call and confirm that she kept her cardiology appt yesterday.  Has surgery scheduled for Thursday.  Has she stopped her plavix.

## 2021-06-09 ENCOUNTER — Other Ambulatory Visit: Payer: Self-pay | Admitting: Internal Medicine

## 2021-07-24 ENCOUNTER — Other Ambulatory Visit: Payer: Self-pay | Admitting: *Deleted

## 2021-07-24 DIAGNOSIS — D649 Anemia, unspecified: Secondary | ICD-10-CM

## 2021-08-03 ENCOUNTER — Inpatient Hospital Stay: Payer: 59 | Attending: Internal Medicine | Admitting: Internal Medicine

## 2021-08-03 ENCOUNTER — Encounter: Payer: Self-pay | Admitting: Internal Medicine

## 2021-08-03 ENCOUNTER — Other Ambulatory Visit: Payer: Self-pay

## 2021-08-03 ENCOUNTER — Inpatient Hospital Stay: Payer: 59

## 2021-08-03 DIAGNOSIS — Z993 Dependence on wheelchair: Secondary | ICD-10-CM | POA: Diagnosis not present

## 2021-08-03 DIAGNOSIS — D649 Anemia, unspecified: Secondary | ICD-10-CM | POA: Insufficient documentation

## 2021-08-03 DIAGNOSIS — Z7901 Long term (current) use of anticoagulants: Secondary | ICD-10-CM | POA: Diagnosis not present

## 2021-08-03 DIAGNOSIS — I4891 Unspecified atrial fibrillation: Secondary | ICD-10-CM | POA: Insufficient documentation

## 2021-08-03 DIAGNOSIS — N189 Chronic kidney disease, unspecified: Secondary | ICD-10-CM | POA: Insufficient documentation

## 2021-08-03 LAB — COMPREHENSIVE METABOLIC PANEL
ALT: 14 U/L (ref 0–44)
AST: 16 U/L (ref 15–41)
Albumin: 3.7 g/dL (ref 3.5–5.0)
Alkaline Phosphatase: 77 U/L (ref 38–126)
Anion gap: 9 (ref 5–15)
BUN: 33 mg/dL — ABNORMAL HIGH (ref 8–23)
CO2: 23 mmol/L (ref 22–32)
Calcium: 8.4 mg/dL — ABNORMAL LOW (ref 8.9–10.3)
Chloride: 105 mmol/L (ref 98–111)
Creatinine, Ser: 1.04 mg/dL — ABNORMAL HIGH (ref 0.44–1.00)
GFR, Estimated: 59 mL/min — ABNORMAL LOW (ref >60)
Glucose, Bld: 101 mg/dL — ABNORMAL HIGH (ref 70–99)
Potassium: 4.2 mmol/L (ref 3.5–5.1)
Sodium: 137 mmol/L (ref 135–145)
Total Bilirubin: <0.1 mg/dL — ABNORMAL LOW (ref 0.3–1.2)
Total Protein: 7.9 g/dL (ref 6.5–8.1)

## 2021-08-03 LAB — CBC WITH DIFFERENTIAL/PLATELET
Abs Immature Granulocytes: 0.03 10*3/uL (ref 0.00–0.07)
Basophils Absolute: 0.1 10*3/uL (ref 0.0–0.1)
Basophils Relative: 1 %
Eosinophils Absolute: 0.2 10*3/uL (ref 0.0–0.5)
Eosinophils Relative: 3 %
HCT: 37.9 % (ref 36.0–46.0)
Hemoglobin: 11.9 g/dL — ABNORMAL LOW (ref 12.0–15.0)
Immature Granulocytes: 1 %
Lymphocytes Relative: 33 %
Lymphs Abs: 1.8 10*3/uL (ref 0.7–4.0)
MCH: 29.7 pg (ref 26.0–34.0)
MCHC: 31.4 g/dL (ref 30.0–36.0)
MCV: 94.5 fL (ref 80.0–100.0)
Monocytes Absolute: 0.6 10*3/uL (ref 0.1–1.0)
Monocytes Relative: 10 %
Neutro Abs: 2.9 10*3/uL (ref 1.7–7.7)
Neutrophils Relative %: 52 %
Platelets: 290 10*3/uL (ref 150–400)
RBC: 4.01 MIL/uL (ref 3.87–5.11)
RDW: 12.7 % (ref 11.5–15.5)
WBC: 5.5 10*3/uL (ref 4.0–10.5)
nRBC: 0 % (ref 0.0–0.2)

## 2021-08-03 NOTE — Assessment & Plan Note (Addendum)
#   Normocytic anemia-etiology unclear; likely secondary to underlying chronic kidney disease/medications-mycophenolate versus others-question rheumatologic.  Hemoglobin today is 11.9 significantly improved from previous from 9.8.  Not on oral iron.  Recommend checking iron studies next visit.  #CKD/glomerulonephritis-as per nephrology [[facility]]; myositis-rheumatology [facility].  Stable.  # A.fib on eliquis 2.5 mg BID- STABLE.   # DISPOSITION: # Follow up in 6 months- MD; labs- cbc/cmp/iron studies/ferritin-;[facility]-; Dr.B

## 2021-08-03 NOTE — Progress Notes (Signed)
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   #History of CAD/MI; A.fib on eliquis.    HISTORY OF PRESENTING ILLNESS: Patient accompanied by her cousin.  She is in a motorized wheelchair.  Leah Espinoza 65 y.o.  female with above history of complicated glomerulonephritis-/CKD normocytic anemia is here for follow-up.  Patient has not had any recent hospitalizations.  She is currently at home.  Denies any blood in stools or black or stools.  Review of Systems  Constitutional:  Positive for malaise/fatigue. Negative for chills, diaphoresis, fever and weight loss.  HENT:  Negative for nosebleeds and sore throat.   Eyes:  Negative for double vision.  Respiratory:  Negative for cough, hemoptysis, sputum production, shortness of breath and wheezing.   Cardiovascular:  Negative for chest pain, palpitations, orthopnea and leg swelling.  Gastrointestinal:  Negative for abdominal pain, blood in stool, constipation, diarrhea, heartburn, melena, nausea and vomiting.  Genitourinary:  Negative for dysuria, frequency and urgency.  Musculoskeletal:  Positive for back pain and joint pain.  Skin: Negative.  Negative for itching and rash.  Neurological:  Positive for weakness. Negative for dizziness, tingling, focal weakness and headaches.  Endo/Heme/Allergies:  Does not bruise/bleed easily.  Psychiatric/Behavioral:  Negative for depression. The patient is not nervous/anxious and does not have insomnia.    MEDICAL HISTORY:  Past Medical History:  Diagnosis Date    Family history of adverse reaction to anesthesia    Sister - PONV   History of chicken pox    History of fainting    Hyperlipidemia    Hypertension    MI (myocardial infarction) ([place])    Motion sickness    circular motion   Vertigo    no episodes for several months    SURGICAL HISTORY: Past Surgical History:  Procedure Laterality Date   CARDIAC CATHETERIZATION N/A 12/23/2015   Procedure: Left Heart Cath and Coronary Angiography;  Surgeon: Isaias Cowman, MD;  Location: [facility];  Service: Cardiovascular;  Laterality: N/A;   CARDIAC CATHETERIZATION N/A 12/23/2015   Procedure: Coronary Stent Intervention;  Surgeon: Isaias Cowman, MD;  Location: [facility];  Service: Cardiovascular;  Laterality: N/A;   CATARACT EXTRACTION W/PHACO Right 07/27/2017   Procedure: CATARACT EXTRACTION PHACO AND INTRAOCULAR LENS PLACEMENT ([place]) RIGHT SYMFONY LENS;  Surgeon: Leandrew Koyanagi, MD;  Location: [facility];  Service: Ophthalmology;  Laterality: Right;   CATARACT EXTRACTION W/PHACO Left 08/10/2017   Procedure: CATARACT EXTRACTION PHACO AND INTRAOCULAR LENS PLACEMENT ([place]) LEFT SYMFONY TORIC LENS;  Surgeon: Leandrew Koyanagi, MD;  Location: [facility];  Service: Ophthalmology;  Laterality: Left;   COLONOSCOPY      SOCIAL HISTORY: Social History   Socioeconomic History   Marital status: Married    Spouse name: Not on file   Number of children: Not on file   Years of education: Not on file   Highest education level: Not on file  Occupational History   Not on file  Tobacco Use   Smoking status: Former   Smokeless tobacco: Never   Tobacco comments:    smoked for approx 1 yr around age 38  Vaping Use   Vaping Use:  Never used  Substance and Sexual Activity   Alcohol use: No    Alcohol/week: 0.0 standard drinks   Drug use: No   Sexual activity: Not on file  Other Topics Concern   Not on file  Social History Narrative   Not on file    Social Determinants of Health   Financial Resource Strain: Not on file  Food Insecurity: Not on file  Transportation Needs: Not on file  Physical Activity: Not on file  Stress: Not on file  Social Connections: Not on file  Intimate Partner Violence: Not on file    FAMILY HISTORY: Family History  Problem Relation Age of Onset   Hyperlipidemia Mother    Heart disease Mother    Cancer Father        lung   Sudden death Brother    Arthritis Maternal Grandmother     ALLERGIES:  has No Known Allergies.  MEDICATIONS:  Current Outpatient Medications  Medication Sig Dispense Refill   aspirin 81 MG EC tablet Take by mouth.     Cholecalciferol (VITAMIN D-1000 MAX ST) 25 MCG (1000 UT) tablet Take 1,000 Units by mouth daily.      ELIQUIS 2.5 MG TABS tablet TAKE 1 TABLET VIA PEG TUBE 2 TIMES DAILYFOR AFIB 60 tablet 1   Melatonin 5 MG TBDP Take 5 mg by mouth at bedtime.     metoprolol tartrate (LOPRESSOR) 25 MG tablet TAKE 1/4 TABLET VIA G TUBE 2 TIMES DAILYFOR AFIB 15 tablet 1   mirtazapine (REMERON) 30 MG tablet Take 30 mg by mouth at bedtime.     mycophenolate (CELLCEPT) 500 MG tablet Take 500 mg by mouth 2 (two) times daily.      vitamin C (ASCORBIC ACID) 250 MG tablet Take 250 mg by mouth daily.     No current facility-administered medications for this visit.      PHYSICAL EXAMINATION:   Vitals:   08/03/21 1424  BP: (!) 156/86  Pulse: 61  Resp: 18  Temp: 98 F (36.7 C)   Filed Weights    Physical Exam Constitutional:      Comments: Patient is in a wheelchair.  HENT:     Head: Normocephalic and atraumatic.     Mouth/Throat:     Pharynx: No oropharyngeal exudate.  Eyes:     Pupils: Pupils are equal, round, and reactive to light.  Cardiovascular:     Rate and Rhythm: Normal rate and regular rhythm.  Pulmonary:     Effort: Pulmonary effort is normal. No respiratory distress.     Breath sounds: Normal breath sounds. No wheezing.  Abdominal:     General: Bowel  sounds are normal. There is no distension.     Palpations: Abdomen is soft. There is no mass.     Tenderness: There is no abdominal tenderness. There is no guarding or rebound.  Musculoskeletal:        General: No tenderness. Normal range of motion.     Cervical back: Normal range of motion and neck supple.     Comments: Weakness bilateral lower extremities.  Skin:    General: Skin is warm.  Neurological:     Mental Status: She is alert and oriented to person, place, and time.  Psychiatric:        Mood and Affect: Affect normal.    LABORATORY DATA:  I have reviewed the data as listed Lab Results  Component Value Date   WBC 5.5 08/03/2021   HGB 11.9 (L) 08/03/2021  HCT 37.9 08/03/2021   MCV 94.5 08/03/2021   PLT 290 08/03/2021   Recent Labs    09/25/20 1103 01/30/21 1255  NA 143 139  K 4.6 4.7  CL 104 105  CO2 24 23  GLUCOSE 105* 103*  BUN 33* 35*  CREATININE 1.08* 0.98  CALCIUM 9.0 9.1  GFRNONAA 57* >60  PROT 7.5 7.7  ALBUMIN 3.6 3.8  AST 20 19  ALT 14 14  ALKPHOS 68 71  BILITOT 0.5 0.5     No results found.  Normocytic anemia # Normocytic anemia-etiology unclear; likely secondary to underlying chronic kidney disease/medications-mycophenolate versus others-question rheumatologic.  Hemoglobin today is 11.9 significantly improved from previous from 9.8.  Not on oral iron.  Recommend checking iron studies next visit.  #CKD/glomerulonephritis-as per nephrology [[facility]]; myositis-rheumatology [facility].  Stable.  # A.fib on eliquis 2.5 mg BID- STABLE.   # DISPOSITION: # Follow up in 6 months- MD; labs- cbc/cmp/iron studies/ferritin-;LD[facility]-; Dr.B  All questions were answered. The patient knows to call the clinic with any problems, questions or concerns.    Cammie Sickle, MD 08/03/2021 2:46 PM

## 2021-08-03 NOTE — Progress Notes (Signed)
Pt here for follow up. No new concerns.  

## 2021-08-11 ENCOUNTER — Other Ambulatory Visit: Payer: Self-pay | Admitting: Internal Medicine

## 2021-10-12 ENCOUNTER — Other Ambulatory Visit: Payer: Self-pay | Admitting: Internal Medicine

## 2021-11-10 ENCOUNTER — Other Ambulatory Visit: Payer: Self-pay | Admitting: Internal Medicine

## 2021-11-11 NOTE — Telephone Encounter (Signed)
I have ok'd the refill for remeron #30 with no refills.  She can continue the medication for now and schedule a f/u.  We can then see if the medication is needed.  Ok to schedule f/u.  ?

## 2021-11-13 ENCOUNTER — Telehealth: Payer: Self-pay | Admitting: *Deleted

## 2021-11-13 ENCOUNTER — Telehealth: Payer: 59 | Admitting: Internal Medicine

## 2021-11-13 NOTE — Telephone Encounter (Signed)
Spoke with patient and she advised she does not need the paperwork completed today she needs at next week appointment on the 8th when she follows up for the Remeron. Paperwork given to CMA.  ?

## 2021-11-18 ENCOUNTER — Ambulatory Visit (INDEPENDENT_AMBULATORY_CARE_PROVIDER_SITE_OTHER): Payer: 59 | Admitting: Internal Medicine

## 2021-11-18 ENCOUNTER — Other Ambulatory Visit: Payer: Self-pay

## 2021-11-18 VITALS — BP 138/80 | HR 86 | Ht 61.0 in | Wt 165.0 lb

## 2021-11-18 DIAGNOSIS — I700 Atherosclerosis of aorta: Secondary | ICD-10-CM | POA: Diagnosis not present

## 2021-11-18 DIAGNOSIS — N019 Rapidly progressive nephritic syndrome with unspecified morphologic changes: Secondary | ICD-10-CM

## 2021-11-18 DIAGNOSIS — Z8371 Family history of colonic polyps: Secondary | ICD-10-CM

## 2021-11-18 DIAGNOSIS — Z0289 Encounter for other administrative examinations: Secondary | ICD-10-CM

## 2021-11-18 DIAGNOSIS — E7800 Pure hypercholesterolemia, unspecified: Secondary | ICD-10-CM | POA: Diagnosis not present

## 2021-11-18 DIAGNOSIS — D649 Anemia, unspecified: Secondary | ICD-10-CM

## 2021-11-18 DIAGNOSIS — I10 Essential (primary) hypertension: Secondary | ICD-10-CM

## 2021-11-18 DIAGNOSIS — I4891 Unspecified atrial fibrillation: Secondary | ICD-10-CM | POA: Diagnosis not present

## 2021-11-18 DIAGNOSIS — G7289 Other specified myopathies: Secondary | ICD-10-CM

## 2021-11-18 DIAGNOSIS — M79676 Pain in unspecified toe(s): Secondary | ICD-10-CM

## 2021-11-18 NOTE — Progress Notes (Signed)
Patient ID: Leah Espinoza, female   DOB: 01-16-1956, 66 y.o.   MRN: [id number] ? ? ?Subjective:  ? ? Patient ID: Leah Espinoza, female    DOB: 1956-06-15, 66 y.o.   MRN: [id number] ? ?This visit occurred during the SARS-CoV-2 public health emergency.  Safety protocols were in place, including screening questions prior to the visit, additional usage of staff PPE, and extensive cleaning of exam room while observing appropriate contact time as indicated for disinfecting solutions.  ? ?Patient here for a scheduled follow up.  ? ?Chief Complaint  ?Patient presents with  ? Form Completion  ? Hypertension  ? .  ? ?HPI ?She is accompanied by her husband.  History obtained from both of them.  In a powered wheelchair - still not able to stand.  Reports not being able to flex her feet and was questioning if a brace might help.  She does do some exercises on her own.  Reports no chest pain.  Feels breathing is stable.  She does have an occasional episode where she will feel nausea and then feel weak.  She has to recline back in her chair.  Has IBS and states she used to have feelings like this with IBS flare.  Discussed possible vasovagal episode.  Discussed further evaluation and w/up (for example EKG, etc).  She declines.  Wants to monitor.  Does not happen often.  She is eating.  No acid reflux reported.  No abdominal pain.  Bowels stable.  She is concerned regarding her toe nails.  Thickened right great toe nail.  Some pain with toenails - 4th right foot and great toe on left foot.  Discussed podiatry referral.   ? ? ?Past Medical History:  ?Diagnosis Date  ? Family history of adverse reaction to anesthesia   ? Sister - PONV  ? History of chicken pox   ? History of fainting   ? Hyperlipidemia   ? Hypertension   ? MI (myocardial infarction) ([place])   ? Motion sickness   ? circular motion  ? Vertigo   ? no episodes for several months  ? ?Past Surgical History:  ?Procedure Laterality Date  ? CARDIAC CATHETERIZATION N/A 12/23/2015   ? Procedure: Left Heart Cath and Coronary Angiography;  Surgeon: Isaias Cowman, MD;  Location: [facility];  Service: Cardiovascular;  Laterality: N/A;  ? CARDIAC CATHETERIZATION N/A 12/23/2015  ? Procedure: Coronary Stent Intervention;  Surgeon: Isaias Cowman, MD;  Location: [facility];  Service: Cardiovascular;  Laterality: N/A;  ? CATARACT EXTRACTION W/PHACO Right 07/27/2017  ? Procedure: CATARACT EXTRACTION PHACO AND INTRAOCULAR LENS PLACEMENT ([place]) RIGHT SYMFONY LENS;  Surgeon: Leandrew Koyanagi, MD;  Location: [facility];  Service: Ophthalmology;  Laterality: Right;  ? CATARACT EXTRACTION W/PHACO Left 08/10/2017  ? Procedure: CATARACT EXTRACTION PHACO AND INTRAOCULAR LENS PLACEMENT ([place]) LEFT SYMFONY TORIC LENS;  Surgeon: Leandrew Koyanagi, MD;  Location: [facility];  Service: Ophthalmology;  Laterality: Left;  ? COLONOSCOPY    ? ?Family History  ?Problem Relation Age of Onset  ? Hyperlipidemia Mother   ? Heart disease Mother   ? Cancer Father   ?     lung  ? Sudden death Brother   ? Arthritis Maternal Grandmother   ? ?Social History  ? ?Socioeconomic History  ? Marital status: Married  ?  Spouse name: Not on file  ? Number of children: Not on file  ? Years of education: Not on file  ? Highest education level:  Not on file  ?Occupational History  ? Not on file  ?Tobacco Use  ? Smoking status: Former  ? Smokeless tobacco: Never  ? Tobacco comments:  ?  smoked for approx 1 yr around age 83  ?Vaping Use  ? Vaping Use: Never used  ?Substance and Sexual Activity  ? Alcohol use: No  ?  Alcohol/week: 0.0 standard drinks  ? Drug use: No  ? Sexual activity: Not on file  ?Other Topics Concern  ? Not on file  ?Social History Narrative  ? Not on file  ? ?Social Determinants of Health  ? ?Financial Resource Strain: Not on file  ?Food Insecurity: Not on file  ?Transportation Needs: Not on file  ?Physical Activity: Not on file  ?Stress: Not on file  ?Social Connections:  Not on file  ? ? ? ?Review of Systems ? ?   ?Objective:  ?  ? ?BP 138/80   Pulse 86   Ht 5\' 1"  (1.549 m)   Wt 165 lb (74.8 kg)   SpO2 97%   BMI 31.18 kg/m?  ?Wt Readings from Last 3 Encounters:  ?11/19/21 165 lb (74.8 kg)  ?05/28/21 165 lb (74.8 kg)  ?01/01/20 147 lb (66.7 kg)  ? ? ?Physical Exam ? ? ?Outpatient Encounter Medications as of 11/18/2021  ?Medication Sig  ? aspirin 81 MG EC tablet Take by mouth.  ? Cholecalciferol (VITAMIN D-1000 MAX ST) 25 MCG (1000 UT) tablet Take 1,000 Units by mouth daily.   ? ELIQUIS 2.5 MG TABS tablet TAKE 1 TABLET VIA PEG TUBE 2 TIMES DAILYFOR AFIB  ? Melatonin 5 MG TBDP Take 5 mg by mouth at bedtime.  ? metoprolol tartrate (LOPRESSOR) 25 MG tablet TAKE 1/4 TABLET VIA G TUBE 2 TIMES DAILYFOR AFIB  ? mirtazapine (REMERON) 30 MG tablet TAKE 1 TABLET BY MOUTH NIGHTLY  ? mycophenolate (CELLCEPT) 500 MG tablet Take 500 mg by mouth 2 (two) times daily.   ? vitamin C (ASCORBIC ACID) 250 MG tablet Take 250 mg by mouth daily.  ? ?No facility-administered encounter medications on file as of 11/18/2021.  ?  ? ?Lab Results  ?Component Value Date  ? WBC 5.5 08/03/2021  ? HGB 11.9 (L) 08/03/2021  ? HCT 37.9 08/03/2021  ? PLT 290 08/03/2021  ? GLUCOSE 101 (H) 08/03/2021  ? CHOL 299 (H) 09/25/2020  ? TRIG 141 09/25/2020  ? HDL 58 09/25/2020  ? [place] 213 (H) 09/25/2020  ? ALT 14 08/03/2021  ? AST 16 08/03/2021  ? NA 137 08/03/2021  ? K 4.2 08/03/2021  ? CL 105 08/03/2021  ? CREATININE 1.04 (H) 08/03/2021  ? BUN 33 (H) 08/03/2021  ? CO2 23 08/03/2021  ? TSH 2.62 10/25/2019  ? INR 0.91 08/28/2018  ? ? ?No results found. ? ?   ?Assessment & Plan:  ? ?Problem List Items Addressed This Visit   ? ? A-fib ([place])  ?  Appears to be in SR.  On eliquis.  Follow.   ?  ?  ? Anemia  ?  Being followed by Dr Rogue Bussing.  Recent hgb 12.  Follow.  ?  ?  ? Aortic atherosclerosis ([place])  ?  Off statin given history.  ?  ?  ? Encounter for completion of form with patient  ?  Husband needs FMLA form completed.   ?  ?   ? Essential (primary) hypertension - Primary  ?  Continue metoprolol.  Follow pressures.  Recent creatinine .84.   ?  ?  ?  Relevant Orders  ? Basic metabolic panel  ? Family history of colonic polyps  ?  Colonoscopy 12/2013.  F/u in 10 years.  ?  ?  ? Hypercholesterolemia  ?  Low cholesterol diet and exercise  Follow lipid panel.  ?  ?  ? Relevant Orders  ? Lipid panel  ? TSH  ? Necrotizing myopathy  ?  Previously treated with IVIG.  Stable. In powered wheelchair.  Unable to walk/stand.  Increased use of arms.  Not able to flex foot.  Discussed possible brace.  Plans to discuss with podiatry. Continues on cellcept.  ?  ?  ? Pain around toenail  ?  Increased pain around toenails as outlined.  Also with thickened toenail - right great toe.  Request referral to podiatry.   ?  ?  ? Relevant Orders  ? Ambulatory referral to Podiatry  ? Pauci-immune RPGN (rapidly progressive glomerulonephritis)  ?  Followed by nephrology. Recent creatinine .84.  Continues on cellcept.  ?  ?  ? ? ? ?Einar Pheasant, MD  ?

## 2021-11-19 ENCOUNTER — Encounter: Payer: Self-pay | Admitting: Internal Medicine

## 2021-11-19 DIAGNOSIS — D649 Anemia, unspecified: Secondary | ICD-10-CM | POA: Insufficient documentation

## 2021-11-19 DIAGNOSIS — M79676 Pain in unspecified toe(s): Secondary | ICD-10-CM | POA: Insufficient documentation

## 2021-11-19 DIAGNOSIS — Z0289 Encounter for other administrative examinations: Secondary | ICD-10-CM | POA: Insufficient documentation

## 2021-11-19 LAB — LIPID PANEL
Cholesterol: 309 mg/dL — ABNORMAL HIGH (ref 0–200)
HDL: 52.2 mg/dL (ref >39.00)
LDL Cholesterol: 228 mg/dL — ABNORMAL HIGH (ref 0–99)
NonHDL: 256.33
Total CHOL/HDL Ratio: 6
Triglycerides: 144 mg/dL (ref 0.0–149.0)
VLDL: 28.8 mg/dL (ref 0.0–40.0)

## 2021-11-19 LAB — BASIC METABOLIC PANEL
BUN: 29 mg/dL — ABNORMAL HIGH (ref 6–23)
CO2: 24 mEq/L (ref 19–32)
Calcium: 9.4 mg/dL (ref 8.4–10.5)
Chloride: 104 mEq/L (ref 96–112)
Creatinine, Ser: 0.85 mg/dL (ref 0.40–1.20)
GFR: 71.22 mL/min (ref >60.00)
Glucose, Bld: 89 mg/dL (ref 70–99)
Potassium: 4.3 mEq/L (ref 3.5–5.1)
Sodium: 140 mEq/L (ref 135–145)

## 2021-11-19 LAB — TSH: TSH: 2.62 u[IU]/mL (ref 0.35–5.50)

## 2021-11-19 NOTE — Assessment & Plan Note (Addendum)
Followed by nephrology. Recent creatinine .84.  Continues on cellcept.  ?

## 2021-11-19 NOTE — Assessment & Plan Note (Signed)
Continue metoprolol.  Follow pressures.  Recent creatinine .84.   ?

## 2021-11-19 NOTE — Assessment & Plan Note (Signed)
Increased pain around toenails as outlined.  Also with thickened toenail - right great toe.  Request referral to podiatry.   ?

## 2021-11-19 NOTE — Assessment & Plan Note (Signed)
Off statin given history.  ?

## 2021-11-19 NOTE — Assessment & Plan Note (Signed)
Colonoscopy 12/2013.  F/u in 10 years.  

## 2021-11-19 NOTE — Assessment & Plan Note (Signed)
Appears to be in SR.  On eliquis.  Follow.   ?

## 2021-11-19 NOTE — Assessment & Plan Note (Signed)
Low cholesterol diet and exercise.  Follow lipid panel.   

## 2021-11-19 NOTE — Assessment & Plan Note (Signed)
Being followed by Dr Rogue Bussing.  Recent hgb 12.  Follow.  ?

## 2021-11-19 NOTE — Assessment & Plan Note (Addendum)
Previously treated with IVIG.  Stable. In powered wheelchair.  Unable to walk/stand.  Increased use of arms.  Not able to flex foot.  Discussed possible brace.  Plans to discuss with podiatry. Continues on cellcept.  ?

## 2021-11-19 NOTE — Assessment & Plan Note (Signed)
Husband needs FMLA form completed.   ?

## 2021-11-30 ENCOUNTER — Telehealth: Payer: Self-pay

## 2021-11-30 NOTE — Telephone Encounter (Signed)
Called patient to let her know that family member has picked up FMLA. Patient is wondering why she cannot take oral cholesterol medication instead of injectable (recommended repatha in last result note on 3/8) ?

## 2021-11-30 NOTE — Telephone Encounter (Signed)
See last result note.  With her history of necrotizing myopathy, the specialist recommended for her to remain off statin medication.  Given her history of heart disease, was going to see if there was something more she could take.  Is she agreeable to look into and injection q 2 weeks. Would have to get cleared from her nephrologist and rheumatologist.  ?

## 2021-12-01 NOTE — Telephone Encounter (Signed)
Patient would still prefer a pill form instead of injection but is ok for you to consult with nephrology and rheumatology about the repatha and would like for you to ask about a pill that is not a statin. ?

## 2021-12-02 ENCOUNTER — Telehealth: Payer: Self-pay | Admitting: Internal Medicine

## 2021-12-02 NOTE — Telephone Encounter (Signed)
D/w rheumatology/nephrology.  Message.   ?

## 2021-12-02 NOTE — Telephone Encounter (Signed)
Called and left message with rheumatology - with question of starting zetia or repatha ?

## 2021-12-11 ENCOUNTER — Other Ambulatory Visit: Payer: Self-pay | Admitting: Internal Medicine

## 2021-12-16 ENCOUNTER — Other Ambulatory Visit: Payer: Self-pay | Admitting: Internal Medicine

## 2022-01-08 ENCOUNTER — Other Ambulatory Visit: Payer: Self-pay | Admitting: Internal Medicine

## 2022-02-01 ENCOUNTER — Inpatient Hospital Stay (HOSPITAL_BASED_OUTPATIENT_CLINIC_OR_DEPARTMENT_OTHER): Payer: 59 | Admitting: Internal Medicine

## 2022-02-01 ENCOUNTER — Encounter: Payer: Self-pay | Admitting: Internal Medicine

## 2022-02-01 ENCOUNTER — Inpatient Hospital Stay: Payer: 59 | Attending: Internal Medicine

## 2022-02-01 DIAGNOSIS — N189 Chronic kidney disease, unspecified: Secondary | ICD-10-CM | POA: Insufficient documentation

## 2022-02-01 DIAGNOSIS — Z85820 Personal history of malignant melanoma of skin: Secondary | ICD-10-CM | POA: Diagnosis not present

## 2022-02-01 DIAGNOSIS — I4891 Unspecified atrial fibrillation: Secondary | ICD-10-CM | POA: Diagnosis not present

## 2022-02-01 DIAGNOSIS — D649 Anemia, unspecified: Secondary | ICD-10-CM | POA: Diagnosis not present

## 2022-02-01 DIAGNOSIS — N059 Unspecified nephritic syndrome with unspecified morphologic changes: Secondary | ICD-10-CM | POA: Diagnosis not present

## 2022-02-01 DIAGNOSIS — Z7901 Long term (current) use of anticoagulants: Secondary | ICD-10-CM | POA: Diagnosis not present

## 2022-02-01 DIAGNOSIS — Z79899 Other long term (current) drug therapy: Secondary | ICD-10-CM | POA: Insufficient documentation

## 2022-02-01 DIAGNOSIS — Z993 Dependence on wheelchair: Secondary | ICD-10-CM | POA: Insufficient documentation

## 2022-02-01 LAB — CBC WITH DIFFERENTIAL/PLATELET
Abs Immature Granulocytes: 0.02 10*3/uL (ref 0.00–0.07)
Basophils Absolute: 0 10*3/uL (ref 0.0–0.1)
Basophils Relative: 1 %
Eosinophils Absolute: 0.2 10*3/uL (ref 0.0–0.5)
Eosinophils Relative: 3 %
HCT: 36.9 % (ref 36.0–46.0)
Hemoglobin: 11.8 g/dL — ABNORMAL LOW (ref 12.0–15.0)
Immature Granulocytes: 0 %
Lymphocytes Relative: 36 %
Lymphs Abs: 2.2 10*3/uL (ref 0.7–4.0)
MCH: 29.6 pg (ref 26.0–34.0)
MCHC: 32 g/dL (ref 30.0–36.0)
MCV: 92.5 fL (ref 80.0–100.0)
Monocytes Absolute: 0.6 10*3/uL (ref 0.1–1.0)
Monocytes Relative: 9 %
Neutro Abs: 3.1 10*3/uL (ref 1.7–7.7)
Neutrophils Relative %: 51 %
Platelets: 300 10*3/uL (ref 150–400)
RBC: 3.99 MIL/uL (ref 3.87–5.11)
RDW: 13.2 % (ref 11.5–15.5)
WBC: 6 10*3/uL (ref 4.0–10.5)
nRBC: 0 % (ref 0.0–0.2)

## 2022-02-01 LAB — COMPREHENSIVE METABOLIC PANEL
ALT: 13 U/L (ref 0–44)
AST: 16 U/L (ref 15–41)
Albumin: 3.6 g/dL (ref 3.5–5.0)
Alkaline Phosphatase: 66 U/L (ref 38–126)
Anion gap: 9 (ref 5–15)
BUN: 28 mg/dL — ABNORMAL HIGH (ref 8–23)
CO2: 25 mmol/L (ref 22–32)
Calcium: 8.7 mg/dL — ABNORMAL LOW (ref 8.9–10.3)
Chloride: 106 mmol/L (ref 98–111)
Creatinine, Ser: 1.02 mg/dL — ABNORMAL HIGH (ref 0.44–1.00)
GFR, Estimated: >60 mL/min (ref >60)
Glucose, Bld: 99 mg/dL (ref 70–99)
Potassium: 4.1 mmol/L (ref 3.5–5.1)
Sodium: 140 mmol/L (ref 135–145)
Total Bilirubin: 0.4 mg/dL (ref 0.3–1.2)
Total Protein: 7.6 g/dL (ref 6.5–8.1)

## 2022-02-01 LAB — FERRITIN: Ferritin: 98 ng/mL (ref 11–307)

## 2022-02-01 LAB — LACTATE DEHYDROGENASE: LDH: 128 U/L (ref 98–192)

## 2022-02-01 LAB — IRON AND TIBC
Iron: 71 ug/dL (ref 28–170)
Saturation Ratios: 21 % (ref 10.4–31.8)
TIBC: 342 ug/dL (ref 250–450)
UIBC: 271 ug/dL

## 2022-02-01 NOTE — Patient Instructions (Signed)
#  Recommend gentle iron 1 pill a day; should not upset your stomach or cause constipation.  Talk to the pharmacist if you can find it/it is over-the-counter.  

## 2022-02-01 NOTE — Progress Notes (Signed)
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   #History of CAD/MI; A.fib on eliquis.    HISTORY OF PRESENTING ILLNESS: Patient accompanied by her cousin.  She is in a motorized wheelchair.  Leah Espinoza 66 y.o.  female with above history of complicated glomerulonephritis-/CKD normocytic anemia is here for follow-up.  Patient has not had any recent hospitalizations.  She is currently at home.  Denies any blood in stools or black or stools.  Patient is not on oral iron.  Review of Systems  Constitutional:  Positive for malaise/fatigue. Negative for chills, diaphoresis, fever and weight loss.  HENT:  Negative for nosebleeds and sore throat.   Eyes:  Negative for double vision.  Respiratory:  Negative for cough, hemoptysis, sputum production, shortness of breath and wheezing.   Cardiovascular:  Negative for chest pain, palpitations, orthopnea and leg swelling.  Gastrointestinal:  Negative for abdominal pain, blood in stool, constipation, diarrhea, heartburn, melena, nausea and vomiting.  Genitourinary:  Negative for dysuria, frequency and urgency.  Musculoskeletal:  Positive for back pain and joint pain.  Skin: Negative.  Negative for itching and rash.  Neurological:  Positive for weakness. Negative for dizziness, tingling, focal weakness and headaches.  Endo/Heme/Allergies:  Does not bruise/bleed easily.  Psychiatric/Behavioral:  Negative for depression. The patient is not nervous/anxious and does not have insomnia.    MEDICAL HISTORY:  Past  Medical History:  Diagnosis Date   Family history of adverse reaction to anesthesia    Sister - PONV   History of chicken pox    History of fainting    Hyperlipidemia    Hypertension    MI (myocardial infarction) ([place])    Motion sickness    circular motion   Vertigo    no episodes for several months    SURGICAL HISTORY: Past Surgical History:  Procedure Laterality Date   CARDIAC CATHETERIZATION N/A 12/23/2015   Procedure: Left Heart Cath and Coronary Angiography;  Surgeon: Isaias Cowman, MD;  Location: [facility];  Service: Cardiovascular;  Laterality: N/A;   CARDIAC CATHETERIZATION N/A 12/23/2015   Procedure: Coronary Stent Intervention;  Surgeon: Isaias Cowman, MD;  Location: [place] [facility];  Service: Cardiovascular;  Laterality: N/A;   CATARACT EXTRACTION W/PHACO Right 07/27/2017   Procedure: CATARACT EXTRACTION PHACO AND INTRAOCULAR LENS PLACEMENT ([place]) RIGHT SYMFONY LENS;  Surgeon: Leandrew Koyanagi, MD;  Location: [place];  Service: Ophthalmology;  Laterality: Right;   CATARACT EXTRACTION W/PHACO Left 08/10/2017   Procedure: CATARACT EXTRACTION PHACO AND INTRAOCULAR LENS PLACEMENT ([place]) LEFT SYMFONY TORIC LENS;  Surgeon: Leandrew Koyanagi, MD;  Location: [place];  Service: Ophthalmology;  Laterality: Left;   COLONOSCOPY     MELANOMA EXCISION  05/13/2021    SOCIAL HISTORY: Social History   Socioeconomic History   Marital status: Married    Spouse name: Not on file   Number of children: Not on file   Years of education: Not on file   Highest education level: Not on file  Occupational History   Not on file  Tobacco Use   Smoking status: Former   Smokeless tobacco: Never   Tobacco comments:    smoked for  approx 1 yr around age 72  Vaping Use   Vaping Use: Never used  Substance and Sexual Activity   Alcohol use: No    Alcohol/week: 0.0 standard drinks   Drug use: No   Sexual activity: Not on file  Other  Topics Concern   Not on file  Social History Narrative   Not on file   Social Determinants of Health   Financial Resource Strain: Not on file  Food Insecurity: Not on file  Transportation Needs: Not on file  Physical Activity: Not on file  Stress: Not on file  Social Connections: Not on file  Intimate Partner Violence: Not on file    FAMILY HISTORY: Family History  Problem Relation Age of Onset   Hyperlipidemia Mother    Heart disease Mother    Cancer Father        lung   Sudden death Brother    Arthritis Maternal Grandmother     ALLERGIES:  has No Known Allergies.  MEDICATIONS:  Current Outpatient Medications  Medication Sig Dispense Refill   aspirin 81 MG EC tablet Take by mouth.     Cholecalciferol (VITAMIN D-1000 MAX ST) 25 MCG (1000 UT) tablet Take 1,000 Units by mouth daily.      ELIQUIS 2.5 MG TABS tablet TAKE 1 TABLET VIA PEG TUBE 2 TIMES DAILYFOR AFIB 60 tablet 1   Melatonin 5 MG TBDP Take 5 mg by mouth at bedtime.     metoprolol tartrate (LOPRESSOR) 25 MG tablet TAKE 1/4 TABLET VIA G TUBE 2 TIMES DAILYFOR AFIB 15 tablet 1   mirtazapine (REMERON) 30 MG tablet TAKE 1 TABLET BY MOUTH NIGHTLY 30 tablet 0   mycophenolate (CELLCEPT) 500 MG tablet Take 500 mg by mouth 2 (two) times daily.      vitamin C (ASCORBIC ACID) 250 MG tablet Take 250 mg by mouth daily.     No current facility-administered medications for this visit.      PHYSICAL EXAMINATION:   Vitals:   02/01/22 1456  BP: (!) 159/80  Pulse: (!) 59  Temp: (!) 97.5 F (36.4 C)  SpO2: 100%   Filed Weights    Physical Exam Constitutional:      Comments: Patient is in a wheelchair.  HENT:     Head: Normocephalic and atraumatic.     Mouth/Throat:     Pharynx: No oropharyngeal exudate.  Eyes:     Pupils: Pupils are equal, round, and reactive to light.  Cardiovascular:     Rate and Rhythm: Normal rate and regular rhythm.  Pulmonary:     Effort: Pulmonary effort is normal. No respiratory  distress.     Breath sounds: Normal breath sounds. No wheezing.  Abdominal:     General: Bowel sounds are normal. There is no distension.     Palpations: Abdomen is soft. There is no mass.     Tenderness: There is no abdominal tenderness. There is no guarding or rebound.  Musculoskeletal:        General: No tenderness. Normal range of motion.     Cervical back: Normal range of motion and neck supple.     Comments: Weakness bilateral lower extremities.  Skin:    General: Skin is warm.  Neurological:     Mental Status: She is alert and oriented to person, place, and time.  Psychiatric:        Mood and Affect: Affect normal.    LABORATORY DATA:  I have reviewed the data as listed Lab Results  Component Value Date   WBC 6.0 02/01/2022   HGB 11.8 (L) 02/01/2022   HCT 36.9 02/01/2022   MCV 92.5 02/01/2022   PLT 300 02/01/2022   Recent Labs    08/03/21 1405 11/18/21 1449 02/01/22 1423  NA 137 140 140  K 4.2 4.3 4.1  CL 105 104 106  CO2 23 24 25   GLUCOSE 101* 89 99  BUN 33* 29* 28*  CREATININE 1.04* 0.85 1.02*  CALCIUM 8.4* 9.4 8.7*  GFRNONAA 59*  --  >60  PROT 7.9  --  7.6  ALBUMIN 3.7  --  3.6  AST 16  --  16  ALT 14  --  13  ALKPHOS 77  --  66  BILITOT <0.1*  --  0.4     No results found.  Normocytic anemia # Normocytic anemia-etiology unclear; likely secondary to underlying chronic kidney disease/medications-mycophenolate versus others-question rheumatologic.  Hemoglobin today is 11.8 significantly improved from previous from 9.8.  Not on oral iron/secondary to constipation.  Recommend gentle iron. #Recommend gentle iron 1 pill a day; should not upset  stomach or cause constipation.  #CKD/glomerulonephritis-as per nephrology [[facility]]; myositis-rheumatology [facility].  Stable.  # Left arm [melanoma] sp excision stable.  # A.fib on eliquis 2.5 mg BID- STABLE.   # DISPOSITION: # Follow up as needed/patient request- Dr.B  All questions were answered. The patient knows  to call the clinic with any problems, questions or concerns.    Cammie Sickle, MD 02/01/2022 4:31 PM

## 2022-02-01 NOTE — Assessment & Plan Note (Addendum)
#   Normocytic anemia-etiology unclear; likely secondary to underlying chronic kidney disease/medications-mycophenolate versus others-question rheumatologic.  Hemoglobin today is 11.8 significantly improved from previous from 9.8.  Not on oral iron/secondary to constipation.  Recommend gentle iron. #Recommend gentle iron 1 pill a day; should not upset  stomach or cause constipation.  #CKD/glomerulonephritis-as per nephrology [[facility]]; myositis-rheumatology [facility].  Stable.  # Left arm [melanoma] sp excision stable.  # A.fib on eliquis 2.5 mg BID- STABLE.   # DISPOSITION: # Follow up as needed/patient request- Dr.B

## 2022-02-10 ENCOUNTER — Other Ambulatory Visit: Payer: Self-pay | Admitting: Internal Medicine

## 2022-02-14 ENCOUNTER — Telehealth: Payer: Self-pay | Admitting: Internal Medicine

## 2022-02-14 NOTE — Telephone Encounter (Signed)
Notify Leah Espinoza that I did contact Dr Graylon Good to discuss cholesterol medication.  She was agreeable for a trial of repatha.  Can schedule an appt to discuss if desires.  ([facility] if virtual visit).  Also, if she desires to speak with Dr Graylon Good can talk with her at her next appt.

## 2022-02-15 NOTE — Telephone Encounter (Signed)
S/w pt - per pt she had asked Dr Saralyn Pilar what his opinion was on [place], and he did not want pt to do it. Pt asking now if you can speak with him and see if you can all coordinate a better treatment plan for her .Marland KitchenMarland KitchenMarland KitchenMarland Kitchen

## 2022-02-17 NOTE — Telephone Encounter (Signed)
Notify Shaneeka that I have contacted cardiology and am waiting to call back.  We will contact her once I hear back.

## 2022-03-10 ENCOUNTER — Other Ambulatory Visit: Payer: Self-pay | Admitting: Internal Medicine

## 2022-04-06 ENCOUNTER — Other Ambulatory Visit: Payer: Self-pay | Admitting: Internal Medicine

## 2022-04-09 ENCOUNTER — Ambulatory Visit (INDEPENDENT_AMBULATORY_CARE_PROVIDER_SITE_OTHER): Payer: 59 | Admitting: Internal Medicine

## 2022-04-09 DIAGNOSIS — I700 Atherosclerosis of aorta: Secondary | ICD-10-CM

## 2022-04-09 DIAGNOSIS — I10 Essential (primary) hypertension: Secondary | ICD-10-CM | POA: Diagnosis not present

## 2022-04-09 DIAGNOSIS — I4891 Unspecified atrial fibrillation: Secondary | ICD-10-CM

## 2022-04-09 DIAGNOSIS — I2510 Atherosclerotic heart disease of native coronary artery without angina pectoris: Secondary | ICD-10-CM

## 2022-04-09 DIAGNOSIS — F439 Reaction to severe stress, unspecified: Secondary | ICD-10-CM

## 2022-04-09 DIAGNOSIS — E7800 Pure hypercholesterolemia, unspecified: Secondary | ICD-10-CM

## 2022-04-09 DIAGNOSIS — Z8371 Family history of colonic polyps: Secondary | ICD-10-CM

## 2022-04-09 DIAGNOSIS — N019 Rapidly progressive nephritic syndrome with unspecified morphologic changes: Secondary | ICD-10-CM

## 2022-04-09 DIAGNOSIS — D649 Anemia, unspecified: Secondary | ICD-10-CM

## 2022-04-09 NOTE — Progress Notes (Unsigned)
Patient ID: Leah Espinoza, female   DOB: Mar 20, 1956, 66 y.o.   MRN: [id number]   Virtual Visit via telephone Note  All issues noted in this document were discussed and addressed.  No physical exam was performed (except for noted visual exam findings with Video Visits).   I connected with [facility] today by telephone and verified that I am speaking with the correct person using two identifiers. Location patient: home Location provider: work  Persons participating in the telephone visit: patient, provider  The limitations, risks, security and privacy concerns of performing an evaluation and management service by telephone and the availability of in person appointments have been discussed.  It has also been discussed with the patient that there may be a patient responsible charge related to this service. The patient expressed understanding and agreed to proceed.   Reason for visit: work in appt  HPI: Work in for form completion.  She continues to require assistance with ADLs.  Remains in wheelchair.  Unable to walk or stand on her own.  Form completed to confirm need for continued assistance.  No chest pain or sob reported.  No abdominal pain.  Bowels moving.  Previously had issues with cold and cough.  Resolved.  Discussed request for PT referral.     ROS: See pertinent positives and negatives per HPI.  Past Medical History:  Diagnosis Date   Family history of adverse reaction to anesthesia    Sister - PONV   History of chicken pox    History of fainting    Hyperlipidemia    Hypertension    MI (myocardial infarction) ([place])    Motion sickness    circular motion   Vertigo    no episodes for several months    Past Surgical History:  Procedure Laterality Date   CARDIAC CATHETERIZATION N/A 12/23/2015   Procedure: Left Heart Cath and Coronary Angiography;  Surgeon: Isaias Cowman, MD;  Location: [facility];  Service: Cardiovascular;  Laterality: N/A;   CARDIAC  CATHETERIZATION N/A 12/23/2015   Procedure: Coronary Stent Intervention;  Surgeon: Isaias Cowman, MD;  Location: [facility];  Service: Cardiovascular;  Laterality: N/A;   CATARACT EXTRACTION W/PHACO Right 07/27/2017   Procedure: CATARACT EXTRACTION PHACO AND INTRAOCULAR LENS PLACEMENT ([place]) RIGHT SYMFONY LENS;  Surgeon: Leandrew Koyanagi, MD;  Location: [facility];  Service: Ophthalmology;  Laterality: Right;   CATARACT EXTRACTION W/PHACO Left 08/10/2017   Procedure: CATARACT EXTRACTION PHACO AND INTRAOCULAR LENS PLACEMENT ([place]) LEFT SYMFONY TORIC LENS;  Surgeon: Leandrew Koyanagi, MD;  Location: [facility];  Service: Ophthalmology;  Laterality: Left;   COLONOSCOPY     MELANOMA EXCISION  05/13/2021    Family History  Problem Relation Age of Onset   Hyperlipidemia Mother    Heart disease Mother    Cancer Father        lung   Sudden death Brother    Arthritis Maternal Grandmother     SOCIAL HX: reviewed.    Current Outpatient Medications:    aspirin 81 MG EC tablet, Take by mouth., Disp: , Rfl:    Cholecalciferol (VITAMIN D-1000 MAX ST) 25 MCG (1000 UT) tablet, Take 1,000 Units by mouth daily. , Disp: , Rfl:    ELIQUIS 2.5 MG TABS tablet, TAKE 1 TABLET VIA PEG TUBE 2 TIMES DAILYFOR AFIB, Disp: 60 tablet, Rfl: 1   Melatonin 5 MG TBDP, Take 5 mg by mouth at bedtime., Disp: , Rfl:    metoprolol tartrate (LOPRESSOR) 25 MG  tablet, TAKE 1/4 TABLET VIA G TUBE 2 TIMES DAILYFOR AFIB, Disp: 15 tablet, Rfl: 1   mirtazapine (REMERON) 30 MG tablet, TAKE 1 TABLET BY MOUTH NIGHTLY, Disp: 30 tablet, Rfl: 0   mycophenolate (CELLCEPT) 500 MG tablet, Take 500 mg by mouth 2 (two) times daily. , Disp: , Rfl:    vitamin C (ASCORBIC ACID) 250 MG tablet, Take 250 mg by mouth daily., Disp: , Rfl:   EXAM:  VITALS per patient if applicable:  GENERAL: alert,. Answering questions appropriately.  Sounds to be in no acute distress.    PSYCH/NEURO: pleasant and  cooperative, no obvious depression or anxiety, speech and thought processing grossly intact  ASSESSMENT AND PLAN:  Discussed the following assessment and plan:  Problem List Items Addressed This Visit     3-vessel CAD    Followed by cardiology.  Unable to take statin medication.  Discussed starting repatha.  Plans to discuss with rheumatology.        A-fib ([place])    On eliquis.        Anemia    Being followed by Dr Rogue Bussing.       Aortic atherosclerosis ([facility])    Off statin.  Unable to take.  Discussed starting repatha.  Plans to discuss with rheumatology.       Essential (primary) hypertension    Continue metoprolol.  Follow pressures.       Family history of colonic polyps    Colonoscopy 12/2013.  F/u in 10 years.       Hypercholesterolemia    Low cholesterol diet and exercise.  Off statin medication.  Unable to take.  Discussed starting repatha.  She plans to discuss with rheumatology.        Pauci-immune RPGN (rapidly progressive glomerulonephritis)    Followed by nephrology. On cellcept.         Stress    Overall stable.        Return if symptoms worsen or fail to improve, for keep scheduled.   I discussed the assessment and treatment plan with the patient. The patient was provided an opportunity to ask questions and all were answered. The patient agreed with the plan and demonstrated an understanding of the instructions.   The patient was advised to call back or seek an in-person evaluation if the symptoms worsen or if the condition fails to improve as anticipated.  I provided 23 minutes of non-face-to-face time during this encounter.   Einar Pheasant, MD

## 2022-04-11 ENCOUNTER — Encounter: Payer: Self-pay | Admitting: Internal Medicine

## 2022-04-11 ENCOUNTER — Telehealth: Payer: Self-pay | Admitting: Internal Medicine

## 2022-04-11 NOTE — Assessment & Plan Note (Signed)
Low cholesterol diet and exercise.  Off statin medication.  Unable to take.  Discussed starting repatha.  She plans to discuss with rheumatology.

## 2022-04-11 NOTE — Assessment & Plan Note (Signed)
Off statin.  Unable to take.  Discussed starting repatha.  Plans to discuss with rheumatology.

## 2022-04-11 NOTE — Assessment & Plan Note (Signed)
Continue metoprolol.  Follow pressures.   

## 2022-04-11 NOTE — Assessment & Plan Note (Signed)
On eliquis

## 2022-04-11 NOTE — Assessment & Plan Note (Signed)
Colonoscopy 12/2013.  F/u in 10 years.  

## 2022-04-11 NOTE — Assessment & Plan Note (Signed)
Being followed by Dr Rogue Bussing.

## 2022-04-11 NOTE — Assessment & Plan Note (Signed)
Overall stable.   

## 2022-04-11 NOTE — Telephone Encounter (Signed)
Recent visit. Form completed for [facility].  Please confirm with her if she wanted to arrange PT now and confirm wanted outpatient PT - [facility].  Thanks.

## 2022-04-11 NOTE — Assessment & Plan Note (Signed)
Followed by cardiology.  Unable to take statin medication.  Discussed starting repatha.  Plans to discuss with rheumatology.

## 2022-04-11 NOTE — Assessment & Plan Note (Signed)
Followed by nephrology. On cellcept.    

## 2022-04-12 NOTE — Telephone Encounter (Signed)
S/w pt - stated does not want to start PT now - wants to wait till weather is cooler. Will let us know when she is agreeable to start.

## 2022-05-05 ENCOUNTER — Other Ambulatory Visit: Payer: Self-pay | Admitting: Internal Medicine

## 2022-05-24 ENCOUNTER — Ambulatory Visit (INDEPENDENT_AMBULATORY_CARE_PROVIDER_SITE_OTHER): Payer: 59 | Admitting: Internal Medicine

## 2022-05-24 ENCOUNTER — Encounter: Payer: Self-pay | Admitting: Internal Medicine

## 2022-05-24 VITALS — Ht 61.0 in | Wt 165.0 lb

## 2022-05-24 DIAGNOSIS — I10 Essential (primary) hypertension: Secondary | ICD-10-CM

## 2022-05-24 NOTE — Progress Notes (Deleted)
Patient ID: Leah Espinoza, female   DOB: Jan 07, 1956, 66 y.o.   MRN: [id number]   Virtual Visit via *** Note  This visit type was conducted due to national recommendations for restrictions regarding the COVID-19 pandemic (e.g. social distancing).  This format is felt to be most appropriate for this patient at this time.  All issues noted in this document were discussed and addressed.  No physical exam was performed (except for noted visual exam findings with Video Visits).   I connected withNAME@ today at  1:00 PM EDT by a video enabled telemedicine application or telephone and verified that I am speaking with the correct person using two identifiers. Location patient: home Location provider: work or home office Persons participating in the virtual visit: patient, provider  I discussed the limitations, risks, security and privacy concerns of performing an evaluation and management service by telephone and the availability of in person appointments. I also discussed with the patient that there may be a patient responsible charge related to this service. The patient expressed understanding and agreed to proceed.  Interactive audio and video telecommunications were attempted between this provider and patient, however failed, due to patient having technical difficulties OR patient did not have access to video capability.  We continued and completed visit with audio only. 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  Recommended decreasing MMF to 250mg  bid. Diagnosed melanoma - removal arm lesion.  Following up with dermatology.    ROS: See pertinent positives and negatives per HPI.  Past Medical History:  Diagnosis Date   Family history of adverse reaction to anesthesia    Sister - PONV   History of chicken pox    History of fainting    Hyperlipidemia    Hypertension    MI (myocardial infarction) ([place])     Motion sickness    circular motion   Vertigo    no episodes for several months    Past Surgical History:  Procedure Laterality Date   CARDIAC CATHETERIZATION N/A 12/23/2015   Procedure: Left Heart Cath and Coronary Angiography;  Surgeon: Isaias Cowman, MD;  Location: [place] [facility];  Service: Cardiovascular;  Laterality: N/A;   CARDIAC CATHETERIZATION N/A 12/23/2015   Procedure: Coronary Stent Intervention;  Surgeon: Isaias Cowman, MD;  Location: [place];  Service: Cardiovascular;  Laterality: N/A;   CATARACT EXTRACTION W/PHACO Right 07/27/2017   Procedure: CATARACT EXTRACTION PHACO AND INTRAOCULAR LENS PLACEMENT ([place]) RIGHT SYMFONY LENS;  Surgeon: Leandrew Koyanagi, MD;  Location: [place];  Service: Ophthalmology;  Laterality: Right;   CATARACT EXTRACTION W/PHACO Left 08/10/2017   Procedure: CATARACT EXTRACTION PHACO AND INTRAOCULAR LENS PLACEMENT ([place]) LEFT SYMFONY TORIC LENS;  Surgeon: Leandrew Koyanagi, MD;  Location: [place];  Service: Ophthalmology;  Laterality: Left;   COLONOSCOPY     MELANOMA EXCISION  05/13/2021    Family History  Problem Relation Age of Onset   Hyperlipidemia Mother    Heart disease Mother    Cancer Father        lung   Sudden death Brother    Arthritis Maternal Grandmother     SOCIAL HX: ***   Current Outpatient Medications:    aspirin 81 MG EC tablet, Take by mouth., Disp: , Rfl:    Cholecalciferol (VITAMIN D-1000 MAX ST) 25 MCG (1000 UT) tablet, Take 1,000 Units by mouth daily. , Disp: , Rfl:    ELIQUIS 2.5 MG  TABS tablet, TAKE 1 TABLET VIA PEG TUBE 2 TIMES DAILYFOR AFIB, Disp: 60 tablet, Rfl: 1   Melatonin 5 MG TBDP, Take 5 mg by mouth at bedtime., Disp: , Rfl:    metoprolol tartrate (LOPRESSOR) 25 MG tablet, TAKE 1/4 TABLET VIA G TUBE 2 TIMES DAILYFOR AFIB, Disp: 15 tablet, Rfl: 1   mirtazapine (REMERON) 30 MG tablet, TAKE 1 TABLET BY MOUTH NIGHTLY, Disp: 30 tablet, Rfl: 0    mycophenolate (CELLCEPT) 500 MG tablet, Take 500 mg by mouth 2 (two) times daily. , Disp: , Rfl:    vitamin C (ASCORBIC ACID) 250 MG tablet, Take 250 mg by mouth daily., Disp: , Rfl:   EXAM:  VITALS per patient if applicable:  GENERAL: alert, oriented, appears well and in no acute distress  HEENT: atraumatic, conjunttiva clear, no obvious abnormalities on inspection of external nose and ears  NECK: normal movements of the head and neck  LUNGS: on inspection no signs of respiratory distress, breathing rate appears normal, no obvious gross SOB, gasping or wheezing  CV: no obvious cyanosis  MS: moves all visible extremities without noticeable abnormality  PSYCH/NEURO: pleasant and cooperative, no obvious depression or anxiety, speech and thought processing grossly intact  ASSESSMENT AND PLAN:  Discussed the following assessment and plan:  Problem List Items Addressed This Visit   None   No follow-ups on file.   I discussed the assessment and treatment plan with the patient. The patient was provided an opportunity to ask questions and all were answered. The patient agreed with the plan and demonstrated an understanding of the instructions.   The patient was advised to call back or seek an in-person evaluation if the symptoms worsen or if the condition fails to improve as anticipated.  I provided *** minutes of non-face-to-face time during this encounter.   Einar Pheasant, MD

## 2022-06-06 ENCOUNTER — Encounter: Payer: Self-pay | Admitting: Internal Medicine

## 2022-06-06 NOTE — Progress Notes (Signed)
Patient ID: Leah Espinoza, female   DOB: 11-29-1955, 66 y.o.   MRN: [id number] Appt had to be changed. Pt was not seen.

## 2022-06-09 ENCOUNTER — Other Ambulatory Visit: Payer: Self-pay | Admitting: Internal Medicine

## 2022-07-08 ENCOUNTER — Other Ambulatory Visit: Payer: Self-pay | Admitting: Internal Medicine

## 2022-08-10 ENCOUNTER — Other Ambulatory Visit: Payer: Self-pay | Admitting: Internal Medicine

## 2022-09-10 ENCOUNTER — Other Ambulatory Visit: Payer: Self-pay | Admitting: Internal Medicine

## 2022-10-12 ENCOUNTER — Other Ambulatory Visit: Payer: Self-pay | Admitting: Internal Medicine

## 2022-10-15 ENCOUNTER — Other Ambulatory Visit: Payer: Self-pay | Admitting: Internal Medicine

## 2022-11-03 ENCOUNTER — Ambulatory Visit (INDEPENDENT_AMBULATORY_CARE_PROVIDER_SITE_OTHER): Payer: Medicare HMO | Admitting: Internal Medicine

## 2022-11-03 ENCOUNTER — Encounter: Payer: Self-pay | Admitting: Internal Medicine

## 2022-11-03 VITALS — BP 150/100 | HR 80 | Temp 98.0°F | Resp 16 | Ht 61.0 in | Wt 165.0 lb

## 2022-11-03 DIAGNOSIS — R1310 Dysphagia, unspecified: Secondary | ICD-10-CM

## 2022-11-03 DIAGNOSIS — D649 Anemia, unspecified: Secondary | ICD-10-CM | POA: Diagnosis not present

## 2022-11-03 DIAGNOSIS — N019 Rapidly progressive nephritic syndrome with unspecified morphologic changes: Secondary | ICD-10-CM

## 2022-11-03 DIAGNOSIS — Z0289 Encounter for other administrative examinations: Secondary | ICD-10-CM

## 2022-11-03 DIAGNOSIS — E7800 Pure hypercholesterolemia, unspecified: Secondary | ICD-10-CM | POA: Diagnosis not present

## 2022-11-03 DIAGNOSIS — Z83719 Family history of colon polyps, unspecified: Secondary | ICD-10-CM | POA: Diagnosis not present

## 2022-11-03 DIAGNOSIS — I2510 Atherosclerotic heart disease of native coronary artery without angina pectoris: Secondary | ICD-10-CM

## 2022-11-03 DIAGNOSIS — G7289 Other specified myopathies: Secondary | ICD-10-CM | POA: Diagnosis not present

## 2022-11-03 DIAGNOSIS — I700 Atherosclerosis of aorta: Secondary | ICD-10-CM

## 2022-11-03 DIAGNOSIS — I10 Essential (primary) hypertension: Secondary | ICD-10-CM | POA: Diagnosis not present

## 2022-11-03 DIAGNOSIS — R0981 Nasal congestion: Secondary | ICD-10-CM | POA: Diagnosis not present

## 2022-11-03 DIAGNOSIS — N059 Unspecified nephritic syndrome with unspecified morphologic changes: Secondary | ICD-10-CM

## 2022-11-03 DIAGNOSIS — I4891 Unspecified atrial fibrillation: Secondary | ICD-10-CM

## 2022-11-03 MED ORDER — APIXABAN 2.5 MG PO TABS: 2.5000 mg | ORAL_TABLET | Freq: Two times a day (BID) | ORAL | 1 refills | Status: AC

## 2022-11-03 MED ORDER — METOPROLOL TARTRATE 25 MG PO TABS: 12.5000 mg | ORAL_TABLET | Freq: Two times a day (BID) | ORAL | 5 refills | Status: AC

## 2022-11-03 NOTE — Patient Instructions (Signed)
Saline nasal spray - flush nose two times per day.   Nasacort nasal spray - 2 sprays each nostril one time per day.  Do this in the evening.   Robitussin or robitussin DM for cough and congestion.

## 2022-11-03 NOTE — Progress Notes (Signed)
Subjective:    Patient ID: Leah Espinoza, female    DOB: [birth year], 67 y.o.   MRN: [id number]  Patient here for  Chief Complaint  Patient presents with   Medical Management of Chronic Issues    HPI Here to follow up regarding CAD, afib, hypertension and hypercholesterolemia.  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 On cellcept. Saw cardiology 06/17/22 - no changes made.  Seeing surgery - f/u melanoma removal. Left axillary LAD. Declined further testing.  She reports things are relatively stable.  She does report having some issues with swallowing. When eating, feels like food is getting stuck.  Has had some episodes of emesis when this occurs.  First started around Thanksgiving.  Discussed further evaluation.  Discussed UGI with barium swallow and referral to GI.  Prefers referral.  No abdominal pain.     Past Medical History:  Diagnosis Date   Family history of adverse reaction to anesthesia    Sister - PONV   History of chicken pox    History of fainting    Hyperlipidemia    Hypertension    MI (myocardial infarction) ([place])    Motion sickness    circular motion   Vertigo    no episodes for several months   Past Surgical History:  Procedure Laterality Date   CARDIAC CATHETERIZATION N/A 12/23/2015   Procedure: Left Heart Cath and Coronary Angiography;  Surgeon: Isaias Cowman, MD;  Location: [facility];  Service: Cardiovascular;  Laterality: N/A;   CARDIAC CATHETERIZATION N/A 12/23/2015   Procedure: Coronary Stent Intervention;  Surgeon: Isaias Cowman, MD;  Location: [facility];  Service: Cardiovascular;  Laterality: N/A;   CATARACT EXTRACTION W/PHACO Right 07/27/2017   Procedure: CATARACT EXTRACTION PHACO AND INTRAOCULAR LENS PLACEMENT ([place]) RIGHT SYMFONY LENS;  Surgeon: Leandrew Koyanagi, MD;  Location: [facility];  Service: Ophthalmology;  Laterality: Right;    CATARACT EXTRACTION W/PHACO Left 08/10/2017   Procedure: CATARACT EXTRACTION PHACO AND INTRAOCULAR LENS PLACEMENT ([place]) LEFT SYMFONY TORIC LENS;  Surgeon: Leandrew Koyanagi, MD;  Location: [facility];  Service: Ophthalmology;  Laterality: Left;   COLONOSCOPY     MELANOMA EXCISION  05/13/2021   Family History  Problem Relation Age of Onset   Hyperlipidemia Mother    Heart disease Mother    Cancer Father        lung   Sudden death Brother    Arthritis Maternal Grandmother    Social History   Socioeconomic History   Marital status: Married    Spouse name: Not on file   Number of children: Not on file   Years of education: Not on file   Highest education level: Not on file  Occupational History   Not on file  Tobacco Use   Smoking status: Former   Smokeless tobacco: Never   Tobacco comments:    smoked for approx 1 yr around age 25  Vaping Use   Vaping Use: Never used  Substance and Sexual Activity   Alcohol use: No    Alcohol/week: 0.0 standard drinks of alcohol   Drug use: No   Sexual activity: Not on file  Other Topics Concern   Not on file  Social History Narrative   Not on file   Social Determinants of Health   Financial Resource Strain: Not on file  Food Insecurity: Not on file  Transportation Needs: Not on file  Physical Activity: Not on file  Stress:  Not on file  Social Connections: Not on file     Review of Systems  Constitutional:  Negative for appetite change and unexpected weight change.  HENT:  Negative for congestion and sinus pressure.   Respiratory:  Negative for cough and chest tightness.        Breathing stable.  No sob reported.   Cardiovascular:  Negative for chest pain and palpitations.  Gastrointestinal:  Negative for abdominal pain and diarrhea.       Dysphagia as outlined.    Genitourinary:  Negative for difficulty urinating and dysuria.  Musculoskeletal:  Negative for joint swelling and myalgias.  Skin:  Negative for color  change and rash.  Neurological:  Negative for dizziness and headaches.  Psychiatric/Behavioral:  Negative for agitation and dysphoric mood.        Objective:     BP (!) 150/100   Pulse 80   Temp 98 F (36.7 C)   Resp 16   Ht '5\' 1"'$  (1.549 m)   Wt 165 lb (74.8 kg)   SpO2 98%   BMI 31.18 kg/m  Wt Readings from Last 3 Encounters:  11/03/22 165 lb (74.8 kg)  05/24/22 165 lb (74.8 kg)  04/09/22 165 lb (74.8 kg)    Physical Exam Vitals reviewed.  Constitutional:      General: She is not in acute distress.    Appearance: Normal appearance.  HENT:     Head: Normocephalic and atraumatic.     Right Ear: External ear normal.     Left Ear: External ear normal.  Eyes:     General: No scleral icterus.       Right eye: No discharge.        Left eye: No discharge.     Conjunctiva/sclera: Conjunctivae normal.  Neck:     Thyroid: No thyromegaly.  Cardiovascular:     Rate and Rhythm: Normal rate and regular rhythm.  Pulmonary:     Effort: No respiratory distress.     Breath sounds: Normal breath sounds. No wheezing.  Abdominal:     General: Bowel sounds are normal.     Palpations: Abdomen is soft.     Tenderness: There is no abdominal tenderness.  Musculoskeletal:        General: No swelling or tenderness.     Cervical back: Neck supple. No tenderness.  Lymphadenopathy:     Cervical: No cervical adenopathy.  Skin:    Findings: No erythema or rash.  Neurological:     Mental Status: She is alert.  Psychiatric:        Mood and Affect: Mood normal.        Behavior: Behavior normal.      Outpatient Encounter Medications as of 11/03/2022  Medication Sig   apixaban (ELIQUIS) 2.5 MG TABS tablet Take 1 tablet (2.5 mg total) by mouth 2 (two) times daily.   aspirin 81 MG EC tablet Take by mouth.   Cholecalciferol (VITAMIN D-1000 MAX ST) 25 MCG (1000 UT) tablet Take 1,000 Units by mouth daily.    Melatonin 5 MG TBDP Take 5 mg by mouth at bedtime.   metoprolol tartrate  (LOPRESSOR) 25 MG tablet Take 0.5 tablets (12.5 mg total) by mouth 2 (two) times daily.   mirtazapine (REMERON) 30 MG tablet TAKE 1 TABLET BY MOUTH NIGHTLY   mycophenolate (CELLCEPT) 250 MG capsule Take 1 capsule by mouth 2 (two) times daily.   vitamin C (ASCORBIC ACID) 250 MG tablet Take 250 mg by mouth daily.   [  DISCONTINUED] ELIQUIS 2.5 MG TABS tablet TAKE 1 TABLET VIA PEG TUBE 2 TIMES DAILYFOR AFIB (Patient taking differently: Take 2.5 mg by mouth 2 (two) times daily.)   [DISCONTINUED] metoprolol tartrate (LOPRESSOR) 25 MG tablet TAKE 1/4 TABLET VIA G TUBE 2 TIMES DAILYFOR AFIB   No facility-administered encounter medications on file as of 11/03/2022.     Lab Results  Component Value Date   WBC 6.0 02/01/2022   HGB 11.8 (L) 02/01/2022   HCT 36.9 02/01/2022   PLT 300 02/01/2022   GLUCOSE 95 11/03/2022   CHOL 323 (H) 11/03/2022   TRIG 139.0 11/03/2022   HDL 54.00 11/03/2022   LDLCALC 241 (H) 11/03/2022   ALT 10 11/03/2022   AST 13 11/03/2022   NA 141 11/03/2022   K 4.0 11/03/2022   CL 104 11/03/2022   CREATININE 0.75 11/03/2022   BUN 21 11/03/2022   CO2 24 11/03/2022   TSH 2.10 11/03/2022   INR 0.91 08/28/2018       Assessment & Plan:  Hypercholesterolemia Assessment & Plan: Low cholesterol diet and exercise.  Off statin medication.  Unable to take.  Discussed starting repatha.  She plans to discuss with rheumatology.  Discussed with rheumatology.  Ok'd to take.  Reports informed by cardiology to hold.   Orders: -     Basic metabolic panel -     Lipid panel -     Hepatic function panel -     TSH  Nasal congestion Assessment & Plan: Just started.  Discussed using saline nasal spray and nasacort nasal spray.  Robitussin as directed.  Nasal swab for covid, flu and RSV.  Discussed quarantine guidelines.  Follow.  Call with update.   Orders: -     COVID-19, Flu A+B and RSV  3-vessel CAD Assessment & Plan: Followed by cardiology.  Unable to take statin medication.   Discussed starting repatha.  Plans to discuss with rheumatology.  Rheumatology ok'd starting.  Discussed with her today.  Reports advised by cardiology to hold.     Atrial fibrillation, unspecified type [facility]) Assessment & Plan: On eliquis.     Anemia, unspecified type Assessment & Plan: Has been followed by Dr Rogue Bussing.    Aortic atherosclerosis (HCC) Assessment & Plan: Off statin.  Unable to take.  See above.    Encounter for completion of form with patient Assessment & Plan: Form completed today to document continued need for assistance.    Essential (primary) hypertension Assessment & Plan: Continue metoprolol.  Follow pressures.    Family history of colonic polyps Assessment & Plan: Colonoscopy 12/2013.  F/u in 10 years.    Glomerulonephritis Assessment & Plan: Followed by nephrology.     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  Stable. In powered wheelchair.  Unable to walk. Increased use of arms.  Continues on cellcept.    Pauci-immune RPGN (rapidly progressive glomerulonephritis) Assessment & Plan: Followed by nephrology. On cellcept.      Dysphagia, unspecified type Assessment & Plan: Reports the issues with swallowing/dysphagia as outlined.  Discussed taking small bites and chewing food well.  Eat slowly.  Refer to GI as outlined.    Orders: -     Ambulatory referral to Gastroenterology  Other orders -     Metoprolol Tartrate; Take 0.5 tablets (12.5 mg total) by mouth 2 (two) times daily.  Dispense: 30 tablet; Refill: 5 -     Apixaban; Take 1 tablet (2.5 mg total) by mouth 2 (two) times daily.  Dispense: 180 tablet; Refill: 1     Einar Pheasant, MD

## 2022-11-04 LAB — HEPATIC FUNCTION PANEL
ALT: 10 U/L (ref 0–35)
AST: 13 U/L (ref 0–37)
Albumin: 4 g/dL (ref 3.5–5.2)
Alkaline Phosphatase: 78 U/L (ref 39–117)
Bilirubin, Direct: 0.1 mg/dL (ref 0.0–0.3)
Total Bilirubin: 0.4 mg/dL (ref 0.2–1.2)
Total Protein: 7.4 g/dL (ref 6.0–8.3)

## 2022-11-04 LAB — LIPID PANEL
Cholesterol: 323 mg/dL — ABNORMAL HIGH (ref 0–200)
HDL: 54 mg/dL (ref >39.00)
LDL Cholesterol: 241 mg/dL — ABNORMAL HIGH (ref 0–99)
NonHDL: 268.67
Total CHOL/HDL Ratio: 6
Triglycerides: 139 mg/dL (ref 0.0–149.0)
VLDL: 27.8 mg/dL (ref 0.0–40.0)

## 2022-11-04 LAB — TSH: TSH: 2.1 u[IU]/mL (ref 0.35–5.50)

## 2022-11-04 LAB — BASIC METABOLIC PANEL
BUN: 21 mg/dL (ref 6–23)
CO2: 24 mEq/L (ref 19–32)
Calcium: 9.3 mg/dL (ref 8.4–10.5)
Chloride: 104 mEq/L (ref 96–112)
Creatinine, Ser: 0.75 mg/dL (ref 0.40–1.20)
GFR: 82.2 mL/min (ref >60.00)
Glucose, Bld: 95 mg/dL (ref 70–99)
Potassium: 4 mEq/L (ref 3.5–5.1)
Sodium: 141 mEq/L (ref 135–145)

## 2022-11-05 LAB — COVID-19, FLU A+B AND RSV
Influenza A, NAA: NOT DETECTED
Influenza B, NAA: NOT DETECTED
RSV, NAA: NOT DETECTED
SARS-CoV-2, NAA: NOT DETECTED

## 2022-11-09 ENCOUNTER — Encounter: Payer: Self-pay | Admitting: Internal Medicine

## 2022-11-09 DIAGNOSIS — R0981 Nasal congestion: Secondary | ICD-10-CM | POA: Insufficient documentation

## 2022-11-09 DIAGNOSIS — R1310 Dysphagia, unspecified: Secondary | ICD-10-CM | POA: Insufficient documentation

## 2022-11-09 NOTE — Assessment & Plan Note (Signed)
Just started.  Discussed using saline nasal spray and nasacort nasal spray.  Robitussin as directed.  Nasal swab for covid, flu and RSV.  Discussed quarantine guidelines.  Follow.  Call with update.

## 2022-11-09 NOTE — Assessment & Plan Note (Signed)
Low cholesterol diet and exercise.  Off statin medication.  Unable to take.  Discussed starting repatha.  She plans to discuss with rheumatology.  Discussed with rheumatology.  Ok'd to take.  Reports informed by cardiology to hold.

## 2022-11-09 NOTE — Assessment & Plan Note (Signed)
Followed by cardiology.  Unable to take statin medication.  Discussed starting repatha.  Plans to discuss with rheumatology.  Rheumatology ok'd starting.  Discussed with her today.  Reports advised by cardiology to hold.

## 2022-11-09 NOTE — Assessment & Plan Note (Signed)
Followed by nephrology. On cellcept.

## 2022-11-09 NOTE — Assessment & Plan Note (Signed)
Colonoscopy 12/2013.  F/u in 10 years.

## 2022-11-09 NOTE — Assessment & Plan Note (Signed)
On eliquis

## 2022-11-09 NOTE — Assessment & Plan Note (Signed)
Form completed today to document continued need for assistance.

## 2022-11-09 NOTE — Assessment & Plan Note (Signed)
Has been followed by Dr Rogue Bussing.

## 2022-11-09 NOTE — Assessment & Plan Note (Signed)
Off statin.  Unable to take.  See above.

## 2022-11-09 NOTE — Assessment & Plan Note (Signed)
Followed by nephrology. 

## 2022-11-09 NOTE — Assessment & Plan Note (Signed)
Continue metoprolol.  Follow pressures.   

## 2022-11-09 NOTE — Assessment & Plan Note (Signed)
Reports the issues with swallowing/dysphagia as outlined.  Discussed taking small bites and chewing food well.  Eat slowly.  Refer to GI as outlined.

## 2022-11-09 NOTE — Assessment & Plan Note (Signed)
Previously treated with IVIG.  Stable. In powered wheelchair.  Unable to walk. Increased use of arms.  Continues on cellcept.

## 2022-11-24 ENCOUNTER — Other Ambulatory Visit: Payer: Self-pay

## 2022-11-24 ENCOUNTER — Telehealth: Payer: Self-pay | Admitting: Internal Medicine

## 2022-11-24 MED ORDER — MIRTAZAPINE 30 MG PO TABS: 30.0000 mg | ORAL_TABLET | Freq: Every day | ORAL | 1 refills | Status: DC

## 2022-11-24 NOTE — Telephone Encounter (Signed)
Rx sent in

## 2022-11-24 NOTE — Telephone Encounter (Signed)
Prescription Request  11/24/2022  LOV: 11/03/2022  What is the name of the medication or equipment? mirtazapine (REMERON) 30 MG tablet  Have you contacted your pharmacy to request a refill? Yes   Which pharmacy would you like this sent to?  [place], [place] [address] [place] [address] Phone: [phone] Fax: [phone]    Patient notified that their request is being sent to the clinical staff for review and that they should receive a response within 2 business days.   Please advise at [place]

## 2022-12-20 ENCOUNTER — Telehealth: Payer: Self-pay | Admitting: Internal Medicine

## 2022-12-20 DIAGNOSIS — M21541 Acquired clubfoot, right foot: Secondary | ICD-10-CM | POA: Diagnosis not present

## 2022-12-20 DIAGNOSIS — I739 Peripheral vascular disease, unspecified: Secondary | ICD-10-CM | POA: Diagnosis not present

## 2022-12-20 DIAGNOSIS — M21542 Acquired clubfoot, left foot: Secondary | ICD-10-CM | POA: Diagnosis not present

## 2022-12-20 DIAGNOSIS — L603 Nail dystrophy: Secondary | ICD-10-CM | POA: Diagnosis not present

## 2022-12-20 DIAGNOSIS — B351 Tinea unguium: Secondary | ICD-10-CM | POA: Diagnosis not present

## 2022-12-20 DIAGNOSIS — M2042 Other hammer toe(s) (acquired), left foot: Secondary | ICD-10-CM | POA: Diagnosis not present

## 2022-12-20 DIAGNOSIS — M2041 Other hammer toe(s) (acquired), right foot: Secondary | ICD-10-CM | POA: Diagnosis not present

## 2022-12-20 DIAGNOSIS — L600 Ingrowing nail: Secondary | ICD-10-CM | POA: Diagnosis not present

## 2022-12-20 DIAGNOSIS — L84 Corns and callosities: Secondary | ICD-10-CM | POA: Diagnosis not present

## 2022-12-20 DIAGNOSIS — S90212A Contusion of left great toe with damage to nail, initial encounter: Secondary | ICD-10-CM | POA: Diagnosis not present

## 2022-12-20 NOTE — Telephone Encounter (Signed)
Contacted Leah Espinoza to schedule their annual wellness visit. Appointment made for 12/23/2022.  Thank you,  [facility] Support [facility] Direct dial  [phone]

## 2022-12-23 ENCOUNTER — Ambulatory Visit (INDEPENDENT_AMBULATORY_CARE_PROVIDER_SITE_OTHER): Payer: Medicare HMO | Admitting: *Deleted

## 2022-12-23 ENCOUNTER — Ambulatory Visit: Payer: Medicare HMO

## 2022-12-23 DIAGNOSIS — Z0000 Encounter for general adult medical examination without abnormal findings: Secondary | ICD-10-CM

## 2022-12-23 NOTE — Progress Notes (Signed)
Subjective:   Leah Espinoza is a 67 y.o. female who presents for an Initial Medicare Annual Wellness Visit.  I connected with  Davis I Ebert on 12/23/22 by a telephone enabled telemedicine application and verified that I am speaking with the correct person using two identifiers.   I discussed the limitations of evaluation and management by telemedicine. The patient expressed understanding and agreed to proceed.  Patient location: home  Provider location: telephone/home    Review of Systems     Cardiac Risk Factors include: advanced age (>28men, >71 women);sedentary lifestyle;hypertension     Objective:    Today's Vitals   There is no height or weight on file to calculate BMI.     12/23/2022    2:32 PM 02/01/2022    2:56 PM 01/30/2021    1:10 PM 10/03/2020    1:51 PM 12/19/2019    2:25 PM 06/05/2018    8:15 AM 08/10/2017    9:07 AM  Advanced Directives  Does Patient Have a Medical Advance Directive? No No  Yes Yes No No  Type of Advance Directive    Healthcare Power of [employer] Power of Attorney    Does patient want to make changes to medical advance directive?     No - Patient declined    Copy of Healthcare Power of Attorney in Chart?     No - copy requested    Would patient like information on creating a medical advance directive? No - Patient declined No - Patient declined No - Patient declined  No - Patient declined No - Patient declined No - Patient declined    Current Medications (verified) Outpatient Encounter Medications as of 12/23/2022  Medication Sig   apixaban (ELIQUIS) 2.5 MG TABS tablet Take 1 tablet (2.5 mg total) by mouth 2 (two) times daily.   aspirin 81 MG EC tablet Take by mouth.   Cholecalciferol (VITAMIN D-1000 MAX ST) 25 MCG (1000 UT) tablet Take 1,000 Units by mouth daily.    Melatonin 5 MG TBDP Take 5 mg by mouth at bedtime.   metoprolol tartrate (LOPRESSOR) 25 MG tablet Take 0.5 tablets (12.5 mg total) by mouth 2 (two) times daily.    mirtazapine (REMERON) 30 MG tablet Take 1 tablet (30 mg total) by mouth at bedtime.   mycophenolate (CELLCEPT) 250 MG capsule Take 1 capsule by mouth 2 (two) times daily.   vitamin C (ASCORBIC ACID) 250 MG tablet Take 250 mg by mouth daily.   No facility-administered encounter medications on file as of 12/23/2022.    Allergies (verified) Patient has no known allergies.   History: Past Medical History:  Diagnosis Date   Family history of adverse reaction to anesthesia    Sister - PONV   History of chicken pox    History of fainting    Hyperlipidemia    Hypertension    MI (myocardial infarction)    Motion sickness    circular motion   Vertigo    no episodes for several months   Past Surgical History:  Procedure Laterality Date   CARDIAC CATHETERIZATION N/A 12/23/2015   Procedure: Left Heart Cath and Coronary Angiography;  Surgeon: Marcina Millard, MD;  Location: [facility] INVASIVE CV LAB;  Service: Cardiovascular;  Laterality: N/A;   CARDIAC CATHETERIZATION N/A 12/23/2015   Procedure: Coronary Stent Intervention;  Surgeon: Marcina Millard, MD;  Location: [facility] INVASIVE CV LAB;  Service: Cardiovascular;  Laterality: N/A;   CATARACT EXTRACTION W/PHACO Right 07/27/2017   Procedure: CATARACT EXTRACTION PHACO  AND INTRAOCULAR LENS PLACEMENT (IOC) RIGHT SYMFONY LENS;  Surgeon: Lockie MolaBrasington, Chadwick, MD;  [facility];  Service: Ophthalmology;  Laterality: Right;   CATARACT EXTRACTION W/PHACO Left 08/10/2017   Procedure: CATARACT EXTRACTION PHACO AND INTRAOCULAR LENS PLACEMENT (IOC) LEFT SYMFONY TORIC LENS;  Surgeon: Lockie MolaBrasington, Chadwick, MD;  [facility];  Service: Ophthalmology;  Laterality: Left;   COLONOSCOPY     MELANOMA EXCISION  05/13/2021   Family History  Problem Relation Age of Onset   Hyperlipidemia Mother    Heart disease Mother    Cancer Father        lung   Sudden death Brother    Arthritis Maternal Grandmother    Social History    Socioeconomic History   Marital status: Married    Spouse name: Not on file   Number of children: Not on file   Years of education: Not on file   Highest education level: Not on file  Occupational History   Not on file  Tobacco Use   Smoking status: Former   Smokeless tobacco: Never   Tobacco comments:    smoked for approx 1 yr around age 89  Vaping Use   Vaping Use: Never used  Substance and Sexual Activity   Alcohol use: No    Alcohol/week: 0.0 standard drinks of alcohol   Drug use: No   Sexual activity: Not on file  Other Topics Concern   Not on file  Social History Narrative   Not on file   Social Determinants of Health   Financial Resource Strain: Low Risk  (12/23/2022)   Overall Financial Resource Strain (CARDIA)    Difficulty of Paying Living Expenses: Not very hard  Food Insecurity: No Food Insecurity (12/23/2022)   Hunger Vital Sign    Worried About Running Out of Food in the Last Year: Never true    Ran Out of Food in the Last Year: Never true  Transportation Needs: Not on file  Physical Activity: Insufficiently Active (12/23/2022)   Exercise Vital Sign    Days of Exercise per Week: 5 days    Minutes of Exercise per Session: 10 min  Stress: No Stress Concern Present (12/23/2022)   [employer] - Occupational Stress Questionnaire    Feeling of Stress : Not at all  Social Connections: Moderately Isolated (12/23/2022)   Social Connection and Isolation Panel [NHANES]    Frequency of Communication with Friends and Family: More than three times a week    Frequency of Social Gatherings with Friends and Family: Three times a week    Attends Religious Services: Never    Active Member of Clubs or Organizations: No    Attends Engineer, structuralClub or Organization Meetings: Never    Marital Status: Married    Tobacco Counseling Counseling given: Not Answered Tobacco comments: smoked for approx 1 yr around age 67   Clinical Intake:  Pre-visit  preparation completed: Yes  Pain : No/denies pain     Diabetes: No  How often do you need to have someone help you when you read instructions, pamphlets, or other written materials from your doctor or pharmacy?: 1 - Never  Diabetic?  no  Interpreter Needed?: No  Information entered by :: Remi HaggardJulie Hanley Rispoli LPN   Activities of Daily Living    12/23/2022    2:09 PM  In your present state of health, do you have any difficulty performing the following activities:  Hearing? 0  Vision? 0  Difficulty concentrating or making decisions?  0  Walking or climbing stairs? 1  Dressing or bathing? 1  Doing errands, shopping? 1  Preparing Food and eating ? Y  Using the Toilet? Y  In the past six months, have you accidently leaked urine? Y  Do you have problems with loss of bowel control? N  Managing your Medications? N  Managing your Finances? Y  Housekeeping or managing your Housekeeping? Y    Patient Care Team: Dale Rozel, MD as PCP - General (Internal Medicine) Earna Coder, MD as Consulting Physician (Hematology and Oncology)  Indicate any recent Medical Services you may have received from other than [facility] providers in the past year (date may be approximate).     Assessment:   This is a routine wellness examination for [facility].  Hearing/Vision screen Hearing Screening - Comments:: No trouble hearing Vision Screening - Comments:: Not up to date  Dietary issues and exercise activities discussed: Current Exercise Habits: The patient does not participate in regular exercise at present   Goals Addressed   None    Depression Screen    12/23/2022    2:11 PM 04/09/2022    7:35 AM 05/28/2021    4:37 PM 03/12/2021    2:54 PM 10/01/2019    3:00 PM 04/25/2017    1:33 PM 07/28/2016   11:29 AM  PHQ 2/9 Scores  PHQ - 2 Score 0 0 0 0 0 0 0  PHQ- 9 Score 2   0  4     Fall Risk    12/23/2022    2:06 PM 04/09/2022    7:35 AM 05/28/2021    4:38 PM 03/12/2021    2:56 PM 04/25/2017     1:34 PM  Fall Risk   Falls in the past year? 0 0 0 0 No  Number falls in past yr: 0 0 0 0   Injury with Fall? 0 0 0 0   Risk for fall due to : Impaired mobility No Fall Risks     Follow up Falls evaluation completed;Education provided;Falls prevention discussed Follow up appointment Falls evaluation completed Falls evaluation completed     FALL RISK PREVENTION PERTAINING TO THE HOME:  Any stairs in or around the home? No  If so, are there any without handrails? No  Home free of loose throw rugs in walkways, pet beds, electrical cords, etc? Yes  Adequate lighting in your home to reduce risk of falls? Yes   ASSISTIVE DEVICES UTILIZED TO PREVENT FALLS:  Life alert? No  Use of a cane, walker or w/c? Yes   hoyer lift Grab bars in the bathroom? No  Shower chair or bench in shower? No  Elevated toilet seat or a handicapped toilet? No   TIMED UP AND GO:  Was the test performed? No .    Cognitive Function:        12/23/2022    2:08 PM  6CIT Screen  What Year? 0 points  What month? 0 points  What time? 0 points  Count back from 20 0 points  Months in reverse 0 points  Repeat phrase 0 points  Total Score 0 points    Immunizations Immunization History  Administered Date(s) Administered   Influenza Nasal 06/18/2013   Influenza Split 07/15/2014   Influenza-Unspecified 06/25/2015, 06/22/2016, 06/13/2018, 07/15/2019, 08/13/2021   Moderna Sars-Covid-2 Vaccination 11/28/2019, 12/26/2019   PNEUMOCOCCAL CONJUGATE-20 11/04/2021    TDAP status: Due, Education has been provided regarding the importance of this vaccine. Advised may receive this vaccine at local pharmacy  or Health Dept. Aware to provide a copy of the vaccination record if obtained from local pharmacy or Health Dept. Verbalized acceptance and understanding.  Flu Vaccine status: Due, Education has been provided regarding the importance of this vaccine. Advised may receive this vaccine at local pharmacy or Health  Dept. Aware to provide a copy of the vaccination record if obtained from local pharmacy or Health Dept. Verbalized acceptance and understanding.  Pneumococcal vaccine status: Up to date  Covid-19 vaccine status: Information provided on how to obtain vaccines.   Qualifies for Shingles Vaccine? Yes   Zostavax completed No   Shingrix Completed?: No.    Education has been provided regarding the importance of this vaccine. Patient has been advised to call insurance company to determine out of pocket expense if they have not yet received this vaccine. Advised may also receive vaccine at local pharmacy or Health Dept. Verbalized acceptance and understanding.  Screening Tests Health Maintenance  Topic Date Due   DTaP/Tdap/Td (1 - Tdap) Never done   COVID-19 Vaccine (3 - Moderna risk series) 01/08/2023 (Originally 01/23/2020)   Zoster Vaccines- Shingrix (1 of 2) 02/01/2023 (Originally [birth year]/11/1974)   Hepatitis C Screening  04/10/2023 (Originally [birth year]1973)   MAMMOGRAM  11/04/2023 (Originally 08/18/2017)   DEXA SCAN  12/23/2023 (Originally 02/12/2020)   INFLUENZA VACCINE  04/14/2023   Medicare Annual Wellness (AWV)  12/23/2023   COLONOSCOPY (Pts 45-75yrs Insurance coverage will need to be confirmed)  01/03/2024   Pneumonia Vaccine 64+ Years old  Completed   HPV VACCINES  Aged Out    Health Maintenance  Health Maintenance Due  Topic Date Due   DTaP/Tdap/Td (1 - Tdap) Never done    Colorectal cancer screening: Type of screening: Colonoscopy. Completed 2015. Repeat every 10 years  Mammogram  Education provided  Bone Density  Education provided  Lung Cancer Screening: (Low Dose CT Chest recommended if Age 40-80 years, 30 pack-year currently smoking OR have quit w/in 15years.) does not qualify.   Lung Cancer Screening Referral:   Additional Screening:  Hepatitis C Screening: does qualify  Vision Screening: Recommended annual ophthalmology exams for early detection of glaucoma and other  disorders of the eye. Is the patient up to date with their annual eye exam?  No  Who is the provider or what is the name of the office in which the patient attends annual eye exams?  If pt is not established with a provider, would they like to be referred to a provider to establish care? No .   Dental Screening: Recommended annual dental exams for proper oral hygiene  Community Resource Referral / Chronic Care Management: CRR required this visit?  No   CCM required this visit?  No      Plan:     I have personally reviewed and noted the following in the patient's chart:   Medical and social history Use of alcohol, tobacco or illicit drugs  Current medications and supplements including opioid prescriptions. Patient is not currently taking opioid prescriptions. Functional ability and status Nutritional status Physical activity Advanced directives List of other physicians Hospitalizations, surgeries, and ER visits in previous 12 months Vitals Screenings to include cognitive, depression, and falls Referrals and appointments  In addition, I have reviewed and discussed with patient certain preventive protocols, quality metrics, and best practice recommendations. A written personalized care plan for preventive services as well as general preventive health recommendations were provided to patient.     Remi Haggard, LPN   7/35/3299   Nurse Notes:  patient will monitor BP she states she went to Podiatrist it was 157/99  when she got home it was 135/82

## 2022-12-23 NOTE — Patient Instructions (Signed)
Leah Espinoza , Thank you for taking time to come for your Medicare Wellness Visit. I appreciate your ongoing commitment to your health goals. Please review the following plan we discussed and let me know if I can assist you in the future.   Screening recommendations/referrals: Colonoscopy: up to date Mammogram: Education provided Bone Density: Education provided Recommended yearly ophthalmology/optometry visit for glaucoma screening and checkup Recommended yearly dental visit for hygiene and checkup  Vaccinations: Influenza vaccine: Education provided Pneumococcal vaccine: up to date Tdap vaccine: Education provided Shingles vaccine: Education provided    Advanced directives: Education provided       Preventive Care 68 Years and Older, Female Preventive care refers to lifestyle choices and visits with your health care provider that can promote health and wellness. What does preventive care include? A yearly physical exam. This is also called an annual well check. Dental exams once or twice a year. Routine eye exams. Ask your health care provider how often you should have your eyes checked. Personal lifestyle choices, including: Daily care of your teeth and gums. Regular physical activity. Eating a healthy diet. Avoiding tobacco and drug use. Limiting alcohol use. Practicing safe sex. Taking low-dose aspirin every day. Taking vitamin and mineral supplements as recommended by your health care provider. What happens during an annual well check? The services and screenings done by your health care provider during your annual well check will depend on your age, overall health, lifestyle risk factors, and family history of disease. Counseling  Your health care provider may ask you questions about your: Alcohol use. Tobacco use. Drug use. Emotional well-being. Home and relationship well-being. Sexual activity. Eating habits. History of falls. Memory and ability to  understand (cognition). Work and work Astronomer. Reproductive health. Screening  You may have the following tests or measurements: Height, weight, and BMI. Blood pressure. Lipid and cholesterol levels. These may be checked every 5 years, or more frequently if you are over 2 years old. Skin check. Lung cancer screening. You may have this screening every year starting at age 68 if you have a 30-pack-year history of smoking and currently smoke or have quit within the past 15 years. Fecal occult blood test (FOBT) of the stool. You may have this test every year starting at age 68. Flexible sigmoidoscopy or colonoscopy. You may have a sigmoidoscopy every 5 years or a colonoscopy every 10 years starting at age 73. Hepatitis C blood test. Hepatitis B blood test. Sexually transmitted disease (STD) testing. Diabetes screening. This is done by checking your blood sugar (glucose) after you have not eaten for a while (fasting). You may have this done every 1-3 years. Bone density scan. This is done to screen for osteoporosis. You may have this done starting at age 40. Mammogram. This may be done every 1-2 years. Talk to your health care provider about how often you should have regular mammograms. Talk with your health care provider about your test results, treatment options, and if necessary, the need for more tests. Vaccines  Your health care provider may recommend certain vaccines, such as: Influenza vaccine. This is recommended every year. Tetanus, diphtheria, and acellular pertussis (Tdap, Td) vaccine. You may need a Td booster every 10 years. Zoster vaccine. You may need this after age 68. Pneumococcal 13-valent conjugate (PCV13) vaccine. One dose is recommended after age 68. Pneumococcal polysaccharide (PPSV23) vaccine. One dose is recommended after age 68. Talk to your health care provider about which screenings and vaccines you need and how often you  need them. This information is not  intended to replace advice given to you by your health care provider. Make sure you discuss any questions you have with your health care provider. Document Released: 09/26/2015 Document Revised: 05/19/2016 Document Reviewed: 07/01/2015 Elsevier Interactive Patient Education  2017 [place] Prevention in the Home Falls can cause injuries. They can happen to people of all ages. There are many things you can do to make your home safe and to help prevent falls. What can I do on the outside of my home? Regularly fix the edges of walkways and driveways and fix any cracks. Remove anything that might make you trip as you walk through a door, such as a raised step or threshold. Trim any bushes or trees on the path to your home. Use bright outdoor lighting. Clear any walking paths of anything that might make someone trip, such as rocks or tools. Regularly check to see if handrails are loose or broken. Make sure that both sides of any steps have handrails. Any raised decks and porches should have guardrails on the edges. Have any leaves, snow, or ice cleared regularly. Use sand or salt on walking paths during winter. Clean up any spills in your garage right away. This includes oil or grease spills. What can I do in the bathroom? Use night lights. Install grab bars by the toilet and in the tub and shower. Do not use towel bars as grab bars. Use non-skid mats or decals in the tub or shower. If you need to sit down in the shower, use a plastic, non-slip stool. Keep the floor dry. Clean up any water that spills on the floor as soon as it happens. Remove soap buildup in the tub or shower regularly. Attach bath mats securely with double-sided non-slip rug tape. Do not have throw rugs and other things on the floor that can make you trip. What can I do in the bedroom? Use night lights. Make sure that you have a light by your bed that is easy to reach. Do not use any sheets or blankets that are  too big for your bed. They should not hang down onto the floor. Have a firm chair that has side arms. You can use this for support while you get dressed. Do not have throw rugs and other things on the floor that can make you trip. What can I do in the kitchen? Clean up any spills right away. Avoid walking on wet floors. Keep items that you use a lot in easy-to-reach places. If you need to reach something above you, use a strong step stool that has a grab bar. Keep electrical cords out of the way. Do not use floor polish or wax that makes floors slippery. If you must use wax, use non-skid floor wax. Do not have throw rugs and other things on the floor that can make you trip. What can I do with my stairs? Do not leave any items on the stairs. Make sure that there are handrails on both sides of the stairs and use them. Fix handrails that are broken or loose. Make sure that handrails are as long as the stairways. Check any carpeting to make sure that it is firmly attached to the stairs. Fix any carpet that is loose or worn. Avoid having throw rugs at the top or bottom of the stairs. If you do have throw rugs, attach them to the floor with carpet tape. Make sure that you have a light switch  at the top of the stairs and the bottom of the stairs. If you do not have them, ask someone to add them for you. What else can I do to help prevent falls? Wear shoes that: Do not have high heels. Have rubber bottoms. Are comfortable and fit you well. Are closed at the toe. Do not wear sandals. If you use a stepladder: Make sure that it is fully opened. Do not climb a closed stepladder. Make sure that both sides of the stepladder are locked into place. Ask someone to hold it for you, if possible. Clearly mark and make sure that you can see: Any grab bars or handrails. First and last steps. Where the edge of each step is. Use tools that help you move around (mobility aids) if they are needed. These  include: Canes. Walkers. Scooters. Crutches. Turn on the lights when you go into a dark area. Replace any light bulbs as soon as they burn out. Set up your furniture so you have a clear path. Avoid moving your furniture around. If any of your floors are uneven, fix them. If there are any pets around you, be aware of where they are. Review your medicines with your doctor. Some medicines can make you feel dizzy. This can increase your chance of falling. Ask your doctor what other things that you can do to help prevent falls. This information is not intended to replace advice given to you by your health care provider. Make sure you discuss any questions you have with your health care provider. Document Released: 06/26/2009 Document Revised: 02/05/2016 Document Reviewed: 10/04/2014 Elsevier Interactive Patient Education  2017 [employer].

## 2023-01-13 DIAGNOSIS — M6080 Other myositis, unspecified site: Secondary | ICD-10-CM | POA: Diagnosis not present

## 2023-01-13 DIAGNOSIS — G729 Myopathy, unspecified: Secondary | ICD-10-CM | POA: Diagnosis not present

## 2023-01-13 DIAGNOSIS — Z5181 Encounter for therapeutic drug level monitoring: Secondary | ICD-10-CM | POA: Diagnosis not present

## 2023-01-13 DIAGNOSIS — N059 Unspecified nephritic syndrome with unspecified morphologic changes: Secondary | ICD-10-CM | POA: Diagnosis not present

## 2023-01-13 DIAGNOSIS — D849 Immunodeficiency, unspecified: Secondary | ICD-10-CM | POA: Diagnosis not present

## 2023-01-20 DIAGNOSIS — D485 Neoplasm of uncertain behavior of skin: Secondary | ICD-10-CM | POA: Diagnosis not present

## 2023-01-20 DIAGNOSIS — D2272 Melanocytic nevi of left lower limb, including hip: Secondary | ICD-10-CM | POA: Diagnosis not present

## 2023-01-20 DIAGNOSIS — L570 Actinic keratosis: Secondary | ICD-10-CM | POA: Diagnosis not present

## 2023-01-20 DIAGNOSIS — L821 Other seborrheic keratosis: Secondary | ICD-10-CM | POA: Diagnosis not present

## 2023-01-20 DIAGNOSIS — D2261 Melanocytic nevi of right upper limb, including shoulder: Secondary | ICD-10-CM | POA: Diagnosis not present

## 2023-01-20 DIAGNOSIS — D2262 Melanocytic nevi of left upper limb, including shoulder: Secondary | ICD-10-CM | POA: Diagnosis not present

## 2023-01-20 DIAGNOSIS — D225 Melanocytic nevi of trunk: Secondary | ICD-10-CM | POA: Diagnosis not present

## 2023-01-20 DIAGNOSIS — C44519 Basal cell carcinoma of skin of other part of trunk: Secondary | ICD-10-CM | POA: Diagnosis not present

## 2023-02-15 DIAGNOSIS — N058 Unspecified nephritic syndrome with other morphologic changes: Secondary | ICD-10-CM | POA: Diagnosis not present

## 2023-02-15 DIAGNOSIS — N189 Chronic kidney disease, unspecified: Secondary | ICD-10-CM | POA: Diagnosis not present

## 2023-02-15 DIAGNOSIS — N179 Acute kidney failure, unspecified: Secondary | ICD-10-CM | POA: Diagnosis not present

## 2023-02-15 DIAGNOSIS — N057 Unspecified nephritic syndrome with diffuse crescentic glomerulonephritis: Secondary | ICD-10-CM | POA: Diagnosis not present

## 2023-02-22 DIAGNOSIS — K219 Gastro-esophageal reflux disease without esophagitis: Secondary | ICD-10-CM | POA: Diagnosis not present

## 2023-02-22 DIAGNOSIS — R1310 Dysphagia, unspecified: Secondary | ICD-10-CM | POA: Diagnosis not present

## 2023-02-24 ENCOUNTER — Telehealth: Payer: Self-pay | Admitting: Internal Medicine

## 2023-02-24 DIAGNOSIS — N189 Chronic kidney disease, unspecified: Secondary | ICD-10-CM

## 2023-02-24 NOTE — Telephone Encounter (Signed)
Pt called wanting to know if she can get her labs done here for her nephrology doctor if she has the order

## 2023-02-25 NOTE — Telephone Encounter (Signed)
I am ok with this if lab is ok.

## 2023-02-25 NOTE — Telephone Encounter (Signed)
Are you ok to order if she brings [place] requisition to know what to order?

## 2023-02-28 DIAGNOSIS — C44519 Basal cell carcinoma of skin of other part of trunk: Secondary | ICD-10-CM | POA: Diagnosis not present

## 2023-02-28 NOTE — Telephone Encounter (Signed)
Patient is going to drop off lab requistion so I can order labs and schedule her to come in and have drawn

## 2023-03-01 NOTE — Telephone Encounter (Signed)
Pt husband came into the office to drop off lab requisition. Placed in provider folder

## 2023-03-02 NOTE — Telephone Encounter (Addendum)
Orders placed & providers info added to comment orders. Will hold original order until results are received.

## 2023-03-02 NOTE — Addendum Note (Signed)
Addended by: Warden Fillers on: 03/02/2023 10:22 AM   Modules accepted: Orders

## 2023-03-04 ENCOUNTER — Other Ambulatory Visit (INDEPENDENT_AMBULATORY_CARE_PROVIDER_SITE_OTHER): Payer: Medicare HMO

## 2023-03-04 DIAGNOSIS — N189 Chronic kidney disease, unspecified: Secondary | ICD-10-CM

## 2023-03-04 NOTE — Addendum Note (Signed)
Addended by: Jarvis Morgan D on: 03/04/2023 02:20 PM   Modules accepted: Orders

## 2023-03-04 NOTE — Addendum Note (Signed)
Addended by: Warden Fillers on: 03/04/2023 12:24 PM   Modules accepted: Orders

## 2023-03-05 LAB — RENAL FUNCTION PANEL
Albumin: 3.9 g/dL (ref 3.9–4.9)
BUN/Creatinine Ratio: 32 — ABNORMAL HIGH (ref 12–28)
BUN: 34 mg/dL — ABNORMAL HIGH (ref 8–27)
CO2: 19 mmol/L — ABNORMAL LOW (ref 20–29)
Calcium: 9.6 mg/dL (ref 8.7–10.3)
Chloride: 102 mmol/L (ref 96–106)
Creatinine, Ser: 1.07 mg/dL — ABNORMAL HIGH (ref 0.57–1.00)
Glucose: 92 mg/dL (ref 70–99)
Phosphorus: 4.1 mg/dL (ref 3.0–4.3)
Potassium: 4.8 mmol/L (ref 3.5–5.2)
Sodium: 140 mmol/L (ref 134–144)
eGFR: 57 mL/min/{1.73_m2} — ABNORMAL LOW (ref >59)

## 2023-03-08 ENCOUNTER — Telehealth: Payer: Self-pay

## 2023-03-08 ENCOUNTER — Encounter: Payer: Self-pay | Admitting: Pharmacist

## 2023-03-08 NOTE — Progress Notes (Signed)
[facility])                                            [facility] Team                                        Statin Quality Measure Assessment    03/08/2023  Leah Espinoza May 20, 1956 [id number]  Per review of chart and payor information, patient has a diagnosis of cardiovascular disease but is not currently filling a statin prescription.  This places patient into the [facility] (Statin Use In Patients with Cardiovascular Disease) measure for CMS.    There is documentation that the patient could not tolerate statins and was considering Repatha.  She has an upcoming appointment 03/09/23.  If deemed therapeutically appropriate, a statin exclusion code could be associated with the patient's visit to remove the patient from the measure.     Component Value Date/Time   CHOL 323 (H) 11/03/2022 1423   CHOL 181 11/02/2017 0914   TRIG 139.0 11/03/2022 1423   HDL 54.00 11/03/2022 1423   HDL 65 11/02/2017 0914   CHOLHDL 6 11/03/2022 1423   VLDL 27.8 11/03/2022 1423   LDLCALC 241 (H) 11/03/2022 1423   LDLCALC 104 (H) 11/02/2017 0914     Please consider ONE of the following recommendations:  Initiate high intensity statin Atorvastatin 40mg  once daily, #90, 3 refills   Rosuvastatin 20mg  once daily, #90, 3 refills    Initiate moderate intensity  statin with reduced frequency if prior  statin intolerance 1x weekly, #13, 3 refills   2x weekly, #26, 3 refills   3x weekly, #39, 3 refills    Code for past statin intolerance  (required annually)   Provider Requirements:  Must asociate code during an office visit or telehealth encounter   Drug Induced Myopathy G72.0   Myalgia M79.1   Myositis, unspecified M60.9   Myopathy, unspecified G72.9   Rhabdomyolysis M62.82     Plan:  Route note to the patient's provider prior to the upcoming appointment.  Beecher Mcardle, PharmD, BCACP [facility] Clinical Pharmacist [phone]

## 2023-03-08 NOTE — Telephone Encounter (Signed)
Labs resent with patient info. Sent through [facility] the first time. Was not aware that it would not show patient info

## 2023-03-08 NOTE — Telephone Encounter (Signed)
A lady from [facility] called to state that we sent them information on a patient, but there is no name or date of birth, only an MRN.  She gave me the MRN.  While I was speaking with Rita Ohara, LPN, to verify that we did send this information, caller hung up.  Bethann Berkshire states she will send the information to them again with identifying information included.

## 2023-03-09 ENCOUNTER — Ambulatory Visit (INDEPENDENT_AMBULATORY_CARE_PROVIDER_SITE_OTHER): Payer: Medicare HMO | Admitting: Internal Medicine

## 2023-03-09 ENCOUNTER — Encounter: Payer: Self-pay | Admitting: Internal Medicine

## 2023-03-09 VITALS — BP 128/74 | HR 64 | Temp 98.0°F | Resp 16

## 2023-03-09 DIAGNOSIS — G7289 Other specified myopathies: Secondary | ICD-10-CM

## 2023-03-09 DIAGNOSIS — R300 Dysuria: Secondary | ICD-10-CM

## 2023-03-09 DIAGNOSIS — F439 Reaction to severe stress, unspecified: Secondary | ICD-10-CM | POA: Diagnosis not present

## 2023-03-09 DIAGNOSIS — I700 Atherosclerosis of aorta: Secondary | ICD-10-CM | POA: Diagnosis not present

## 2023-03-09 DIAGNOSIS — N019 Rapidly progressive nephritic syndrome with unspecified morphologic changes: Secondary | ICD-10-CM

## 2023-03-09 DIAGNOSIS — I4891 Unspecified atrial fibrillation: Secondary | ICD-10-CM | POA: Diagnosis not present

## 2023-03-09 DIAGNOSIS — E7800 Pure hypercholesterolemia, unspecified: Secondary | ICD-10-CM

## 2023-03-09 DIAGNOSIS — I2510 Atherosclerotic heart disease of native coronary artery without angina pectoris: Secondary | ICD-10-CM

## 2023-03-09 DIAGNOSIS — D649 Anemia, unspecified: Secondary | ICD-10-CM | POA: Diagnosis not present

## 2023-03-09 DIAGNOSIS — N059 Unspecified nephritic syndrome with unspecified morphologic changes: Secondary | ICD-10-CM

## 2023-03-09 DIAGNOSIS — I10 Essential (primary) hypertension: Secondary | ICD-10-CM | POA: Diagnosis not present

## 2023-03-09 NOTE — Progress Notes (Signed)
Subjective:    Patient ID: Leah Espinoza, female    DOB: 1956-05-06, 67 y.o.   MRN: [id number]  Patient here for  Chief Complaint  Patient presents with   Medical Management of Chronic Issues    HPI Here to follow up regarding CAD, afib, hypertension and hypercholesterolemia. She is accompanied by her husband.  History obtained from both of them. 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 Recently stopped cellcept. Restarted back on irbesartan. When evaluated recently by nephrology,  she was having issues with what she felt like might be a UTI.  Recent few days, noticed some chills and vomiting previously.  Some abdominal pressure.  Has been drinking cranberry juice.  Decreased tea.  Feeling better now.  Was questioning UTI.  Unable to give urine specimen.  Also, was trying to work with PT previously - to stand.  Unable to control her foot - describing "it points straight to the ground".  Increased discomfort with trying to stand.  Discussed brace, etc.    Past Medical History:  Diagnosis Date   Family history of adverse reaction to anesthesia    Sister - PONV   History of chicken pox    History of fainting    Hyperlipidemia    Hypertension    MI (myocardial infarction) (HCC)    Motion sickness    circular motion   Vertigo    no episodes for several months   Past Surgical History:  Procedure Laterality Date   CARDIAC CATHETERIZATION N/A 12/23/2015   Procedure: Left Heart Cath and Coronary Angiography;  Surgeon: Marcina Millard, MD;  Location: [facility] INVASIVE CV LAB;  Service: Cardiovascular;  Laterality: N/A;   CARDIAC CATHETERIZATION N/A 12/23/2015   Procedure: Coronary Stent Intervention;  Surgeon: Marcina Millard, MD;  Location: [facility] INVASIVE CV LAB;  Service: Cardiovascular;  Laterality: N/A;   CATARACT EXTRACTION W/PHACO Right 07/27/2017   Procedure: CATARACT EXTRACTION PHACO AND INTRAOCULAR LENS  PLACEMENT (IOC) RIGHT SYMFONY LENS;  Surgeon: Lockie Mola, MD;  Location: [facility];  Service: Ophthalmology;  Laterality: Right;   CATARACT EXTRACTION W/PHACO Left 08/10/2017   Procedure: CATARACT EXTRACTION PHACO AND INTRAOCULAR LENS PLACEMENT (IOC) LEFT SYMFONY TORIC LENS;  Surgeon: Lockie Mola, MD;  Location: [facility];  Service: Ophthalmology;  Laterality: Left;   COLONOSCOPY     MELANOMA EXCISION  05/13/2021   Family History  Problem Relation Age of Onset   Hyperlipidemia Mother    Heart disease Mother    Cancer Father        lung   Sudden death Brother    Arthritis Maternal Grandmother    Social History   Socioeconomic History   Marital status: Married    Spouse name: Not on file   Number of children: Not on file   Years of education: Not on file   Highest education level: Not on file  Occupational History   Not on file  Tobacco Use   Smoking status: Former   Smokeless tobacco: Never   Tobacco comments:    smoked for approx 1 yr around age 66  Vaping Use   Vaping Use: Never used  Substance and Sexual Activity   Alcohol use: No    Alcohol/week: 0.0 standard drinks of alcohol   Drug use: No   Sexual activity: Not on file  Other Topics Concern   Not on file  Social History Narrative   Not on file   Social Determinants of  Health   Financial Resource Strain: Low Risk  (12/23/2022)   Overall Financial Resource Strain (CARDIA)    Difficulty of Paying Living Expenses: Not very hard  Food Insecurity: No Food Insecurity (12/23/2022)   Hunger Vital Sign    Worried About Running Out of Food in the Last Year: Never true    Ran Out of Food in the Last Year: Never true  Transportation Needs: Not on file  Physical Activity: Insufficiently Active (12/23/2022)   Exercise Vital Sign    Days of Exercise per Week: 5 days    Minutes of Exercise per Session: 10 min  Stress: No Stress Concern Present (12/23/2022)   [employer] of  Occupational Health - Occupational Stress Questionnaire    Feeling of Stress : Not at all  Social Connections: Moderately Isolated (12/23/2022)   Social Connection and Isolation Panel [NHANES]    Frequency of Communication with Friends and Family: More than three times a week    Frequency of Social Gatherings with Friends and Family: Three times a week    Attends Religious Services: Never    Active Member of Clubs or Organizations: No    Attends Banker Meetings: Never    Marital Status: Married     Review of Systems  Constitutional:  Negative for appetite change and unexpected weight change.  HENT:  Negative for congestion and sinus pressure.   Respiratory:  Negative for cough, chest tightness and shortness of breath.   Cardiovascular:  Negative for chest pain and palpitations.  Gastrointestinal:  Negative for abdominal pain, diarrhea, nausea and vomiting.  Genitourinary:        Reports some discomfort with urination.    Musculoskeletal:  Negative for joint swelling.       Unable to stand.  Transfers with hoyer lift.   Skin:  Negative for color change and rash.  Neurological:  Negative for dizziness and headaches.  Psychiatric/Behavioral:  Negative for agitation and dysphoric mood.        Objective:     BP 128/74   Pulse 64   Temp 98 F (36.7 C)   Resp 16   SpO2 98%  Wt Readings from Last 3 Encounters:  11/03/22 165 lb (74.8 kg)  05/24/22 165 lb (74.8 kg)  04/09/22 165 lb (74.8 kg)    Physical Exam Vitals reviewed.  Constitutional:      General: She is not in acute distress.    Appearance: Normal appearance.  HENT:     Head: Normocephalic and atraumatic.     Right Ear: External ear normal.     Left Ear: External ear normal.  Eyes:     General: No scleral icterus.       Right eye: No discharge.        Left eye: No discharge.     Conjunctiva/sclera: Conjunctivae normal.  Neck:     Thyroid: No thyromegaly.  Cardiovascular:     Rate and Rhythm:  Normal rate and regular rhythm.  Pulmonary:     Effort: No respiratory distress.     Breath sounds: Normal breath sounds. No wheezing.  Abdominal:     General: Bowel sounds are normal.     Palpations: Abdomen is soft.     Tenderness: There is no abdominal tenderness.  Musculoskeletal:        General: No swelling or tenderness.     Cervical back: Neck supple. No tenderness.  Lymphadenopathy:     Cervical: No cervical adenopathy.  Skin:  Findings: No erythema or rash.  Neurological:     Mental Status: She is alert.  Psychiatric:        Mood and Affect: Mood normal.        Behavior: Behavior normal.      Outpatient Encounter Medications as of 03/09/2023  Medication Sig   cefdinir (OMNICEF) 300 MG capsule Take 1 capsule (300 mg total) by mouth 2 (two) times daily.   irbesartan (AVAPRO) 75 MG tablet Take 75 mg by mouth daily.   pantoprazole (PROTONIX) 40 MG tablet Take 40 mg by mouth.   apixaban (ELIQUIS) 2.5 MG TABS tablet Take 1 tablet (2.5 mg total) by mouth 2 (two) times daily.   aspirin 81 MG EC tablet Take by mouth.   Cholecalciferol (VITAMIN D-1000 MAX ST) 25 MCG (1000 UT) tablet Take 1,000 Units by mouth daily.    Melatonin 5 MG TBDP Take 5 mg by mouth at bedtime.   metoprolol tartrate (LOPRESSOR) 25 MG tablet Take 0.5 tablets (12.5 mg total) by mouth 2 (two) times daily.   mirtazapine (REMERON) 30 MG tablet Take 1 tablet (30 mg total) by mouth at bedtime.   vitamin C (ASCORBIC ACID) 250 MG tablet Take 250 mg by mouth daily.   [DISCONTINUED] mycophenolate (CELLCEPT) 250 MG capsule Take 1 capsule by mouth 2 (two) times daily.   No facility-administered encounter medications on file as of 03/09/2023.     Lab Results  Component Value Date   WBC 6.0 02/01/2022   HGB 11.8 (L) 02/01/2022   HCT 36.9 02/01/2022   PLT 300 02/01/2022   GLUCOSE 92 03/04/2023   CHOL 323 (H) 11/03/2022   TRIG 139.0 11/03/2022   HDL 54.00 11/03/2022   LDLCALC 241 (H) 11/03/2022   ALT 10  11/03/2022   AST 13 11/03/2022   NA 140 03/04/2023   K 4.8 03/04/2023   CL 102 03/04/2023   CREATININE 1.07 (H) 03/04/2023   BUN 34 (H) 03/04/2023   CO2 19 (L) 03/04/2023   TSH 2.10 11/03/2022   INR 0.91 08/28/2018    No results found.     Assessment & Plan:  3-vessel CAD Assessment & Plan: Followed by cardiology.  Unable to take statin medication.  Discussed starting repatha.  Rheumatology ok'd starting.  Discussed with her today.  Reports advised by cardiology to hold.  Plans to discuss with cardiology.    Atrial fibrillation, unspecified type [facility]) Assessment & Plan: On eliquis.  Stable.    Anemia, unspecified type Assessment & Plan: Has been followed by Dr Donneta Romberg.    Aortic atherosclerosis (HCC) Assessment & Plan: Off statin.  Unable to take.  Discussed starting repatha.    Essential (primary) hypertension Assessment & Plan: Continue metoprolol.  Recently restarted irbesartan. Follow pressures. Follow metabolic panel.    Glomerulonephritis Assessment & Plan: Followed by nephrology.     Hypercholesterolemia Assessment & Plan: Low cholesterol diet and exercise.  Off statin medication.  Unable to take.  Discussed starting repatha.  Discussed with rheumatology.  Ok'd to take.  Reports informed by cardiology to hold. Plans to discuss with cardiology.    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  Stable. In powered wheelchair.  Unable to walk. Increased use of arms.  Off cellcept.    Pauci-immune RPGN (rapidly progressive glomerulonephritis) Assessment & Plan: Followed by nephrology. Off cellcept as outlined.  Restarted irbesartan.    Stress Assessment & Plan: Overall stable.    Dysuria Assessment & Plan: Symptoms as outlined.  Feels  like UTI.  Unable to provide urine sample.  Discussed treatment.  States nephrology mentioned having abx if needed.  Left message with nephrology.  Symptoms persistent.  Will send in rx for  omnicef 300mg  bid x 5 days.    Other orders -     Cefdinir; Take 1 capsule (300 mg total) by mouth 2 (two) times daily.  Dispense: 10 capsule; Refill: 0     Dale Ravenwood, MD

## 2023-03-13 ENCOUNTER — Encounter: Payer: Self-pay | Admitting: Internal Medicine

## 2023-03-13 DIAGNOSIS — R300 Dysuria: Secondary | ICD-10-CM | POA: Insufficient documentation

## 2023-03-13 MED ORDER — CEFDINIR 300 MG PO CAPS: 300.0000 mg | ORAL_CAPSULE | Freq: Two times a day (BID) | ORAL | 0 refills | Status: DC

## 2023-03-13 NOTE — Assessment & Plan Note (Signed)
Low cholesterol diet and exercise.  Off statin medication.  Unable to take.  Discussed starting repatha.  Discussed with rheumatology.  Ok'd to take.  Reports informed by cardiology to hold. Plans to discuss with cardiology.

## 2023-03-13 NOTE — Assessment & Plan Note (Signed)
On eliquis.  Stable.   

## 2023-03-13 NOTE — Assessment & Plan Note (Signed)
Overall stable.   

## 2023-03-13 NOTE — Assessment & Plan Note (Signed)
Has been followed by Dr Brahmanday.  

## 2023-03-13 NOTE — Assessment & Plan Note (Signed)
Followed by nephrology. 

## 2023-03-13 NOTE — Assessment & Plan Note (Signed)
Previously treated with IVIG.  Stable. In powered wheelchair.  Unable to walk. Increased use of arms.  Off cellcept.

## 2023-03-13 NOTE — Assessment & Plan Note (Signed)
Continue metoprolol.  Recently restarted irbesartan. Follow pressures. Follow metabolic panel.

## 2023-03-13 NOTE — Assessment & Plan Note (Signed)
Off statin.  Unable to take.  Discussed starting repatha.

## 2023-03-13 NOTE — Assessment & Plan Note (Signed)
Symptoms as outlined.  Feels like UTI.  Unable to provide urine sample.  Discussed treatment.  States nephrology mentioned having abx if needed.  Left message with nephrology.  Symptoms persistent.  Will send in rx for omnicef 300mg  bid x 5 days.

## 2023-03-13 NOTE — Assessment & Plan Note (Signed)
Followed by cardiology.  Unable to take statin medication.  Discussed starting repatha.  Rheumatology ok'd starting.  Discussed with her today.  Reports advised by cardiology to hold.  Plans to discuss with cardiology.

## 2023-03-13 NOTE — Assessment & Plan Note (Signed)
Followed by nephrology. Off cellcept as outlined.  Restarted irbesartan.

## 2023-03-22 DIAGNOSIS — I2109 ST elevation (STEMI) myocardial infarction involving other coronary artery of anterior wall: Secondary | ICD-10-CM | POA: Diagnosis not present

## 2023-04-08 DIAGNOSIS — C4362 Malignant melanoma of left upper limb, including shoulder: Secondary | ICD-10-CM | POA: Diagnosis not present

## 2023-05-03 DIAGNOSIS — N189 Chronic kidney disease, unspecified: Secondary | ICD-10-CM | POA: Diagnosis not present

## 2023-05-03 DIAGNOSIS — N057 Unspecified nephritic syndrome with diffuse crescentic glomerulonephritis: Secondary | ICD-10-CM | POA: Diagnosis not present

## 2023-05-03 DIAGNOSIS — N058 Unspecified nephritic syndrome with other morphologic changes: Secondary | ICD-10-CM | POA: Diagnosis not present

## 2023-05-03 DIAGNOSIS — I10 Essential (primary) hypertension: Secondary | ICD-10-CM | POA: Diagnosis not present

## 2023-05-03 DIAGNOSIS — G7289 Other specified myopathies: Secondary | ICD-10-CM | POA: Diagnosis not present

## 2023-05-03 DIAGNOSIS — N179 Acute kidney failure, unspecified: Secondary | ICD-10-CM | POA: Diagnosis not present

## 2023-05-12 ENCOUNTER — Encounter: Payer: Self-pay | Admitting: Gastroenterology

## 2023-05-14 DIAGNOSIS — K219 Gastro-esophageal reflux disease without esophagitis: Secondary | ICD-10-CM | POA: Diagnosis not present

## 2023-05-14 DIAGNOSIS — R32 Unspecified urinary incontinence: Secondary | ICD-10-CM | POA: Diagnosis not present

## 2023-05-14 DIAGNOSIS — R269 Unspecified abnormalities of gait and mobility: Secondary | ICD-10-CM | POA: Diagnosis not present

## 2023-05-14 DIAGNOSIS — I2510 Atherosclerotic heart disease of native coronary artery without angina pectoris: Secondary | ICD-10-CM | POA: Diagnosis not present

## 2023-05-14 DIAGNOSIS — F325 Major depressive disorder, single episode, in full remission: Secondary | ICD-10-CM | POA: Diagnosis not present

## 2023-05-14 DIAGNOSIS — E669 Obesity, unspecified: Secondary | ICD-10-CM | POA: Diagnosis not present

## 2023-05-14 DIAGNOSIS — E785 Hyperlipidemia, unspecified: Secondary | ICD-10-CM | POA: Diagnosis not present

## 2023-05-14 DIAGNOSIS — I129 Hypertensive chronic kidney disease with stage 1 through stage 4 chronic kidney disease, or unspecified chronic kidney disease: Secondary | ICD-10-CM | POA: Diagnosis not present

## 2023-05-14 DIAGNOSIS — D6869 Other thrombophilia: Secondary | ICD-10-CM | POA: Diagnosis not present

## 2023-05-14 DIAGNOSIS — G629 Polyneuropathy, unspecified: Secondary | ICD-10-CM | POA: Diagnosis not present

## 2023-05-14 DIAGNOSIS — I252 Old myocardial infarction: Secondary | ICD-10-CM | POA: Diagnosis not present

## 2023-05-14 DIAGNOSIS — I4891 Unspecified atrial fibrillation: Secondary | ICD-10-CM | POA: Diagnosis not present

## 2023-05-19 ENCOUNTER — Encounter: Admission: RE | Payer: Self-pay | Source: Ambulatory Visit

## 2023-05-19 ENCOUNTER — Ambulatory Visit: Admission: RE | Admit: 2023-05-19 | Payer: Medicare HMO | Source: Ambulatory Visit | Admitting: Gastroenterology

## 2023-05-19 SURGERY — EGD (ESOPHAGOGASTRODUODENOSCOPY)
Anesthesia: General

## 2023-07-13 ENCOUNTER — Ambulatory Visit: Payer: Medicare HMO | Admitting: Internal Medicine

## 2023-07-13 ENCOUNTER — Encounter: Payer: Self-pay | Admitting: Internal Medicine

## 2023-07-13 VITALS — BP 136/74 | HR 56 | Temp 98.2°F | Resp 16 | Ht 61.0 in | Wt 165.0 lb

## 2023-07-13 DIAGNOSIS — I4891 Unspecified atrial fibrillation: Secondary | ICD-10-CM | POA: Diagnosis not present

## 2023-07-13 DIAGNOSIS — I2510 Atherosclerotic heart disease of native coronary artery without angina pectoris: Secondary | ICD-10-CM | POA: Diagnosis not present

## 2023-07-13 DIAGNOSIS — R1310 Dysphagia, unspecified: Secondary | ICD-10-CM | POA: Diagnosis not present

## 2023-07-13 DIAGNOSIS — I700 Atherosclerosis of aorta: Secondary | ICD-10-CM | POA: Diagnosis not present

## 2023-07-13 DIAGNOSIS — L89151 Pressure ulcer of sacral region, stage 1: Secondary | ICD-10-CM | POA: Diagnosis not present

## 2023-07-13 DIAGNOSIS — E7800 Pure hypercholesterolemia, unspecified: Secondary | ICD-10-CM

## 2023-07-13 DIAGNOSIS — N019 Rapidly progressive nephritic syndrome with unspecified morphologic changes: Secondary | ICD-10-CM | POA: Diagnosis not present

## 2023-07-13 DIAGNOSIS — F439 Reaction to severe stress, unspecified: Secondary | ICD-10-CM | POA: Diagnosis not present

## 2023-07-13 DIAGNOSIS — L8991 Pressure ulcer of unspecified site, stage 1: Secondary | ICD-10-CM | POA: Insufficient documentation

## 2023-07-13 DIAGNOSIS — N059 Unspecified nephritic syndrome with unspecified morphologic changes: Secondary | ICD-10-CM

## 2023-07-13 DIAGNOSIS — G7289 Other specified myopathies: Secondary | ICD-10-CM | POA: Diagnosis not present

## 2023-07-13 DIAGNOSIS — I10 Essential (primary) hypertension: Secondary | ICD-10-CM

## 2023-07-13 MED ORDER — REPATHA SURECLICK 140 MG/ML ~~LOC~~ SOAJ: 140.0000 mg | SUBCUTANEOUS | 2 refills | Status: DC

## 2023-07-13 MED ORDER — SILVER SULFADIAZINE 1 % EX CREA: 1.0000 | TOPICAL_CREAM | Freq: Every day | CUTANEOUS | 0 refills | Status: DC

## 2023-07-13 NOTE — Progress Notes (Signed)
Subjective:    Patient ID: Leah Espinoza, female    DOB: 1956-06-06, 67 y.o.   MRN: [id number]  Patient here for  Chief Complaint  Patient presents with   Medical Management of Chronic Issues    HPI Here for a scheduled follow up - follow up regarding CAD, afib, hypertension and hypercholesterolemia. She is accompanied by her caretaker.  History obtained from both of them. 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 Had f/u with nephrology 04/2023.  Remain off cellcept. She reports things are relatively stable.  She is having some skin issues - sacrum.  No chest pain or sob reported.  Discussed treating her cholesterol.  Her specialist have ok'd starting repatha.  Bowels ok. No abdominal pain.    Past Medical History:  Diagnosis Date   Family history of adverse reaction to anesthesia    Sister - PONV   History of chicken pox    History of fainting    Hyperlipidemia    Hypertension    MI (myocardial infarction) (HCC)    Motion sickness    circular motion   Vertigo    no episodes for several months   Past Surgical History:  Procedure Laterality Date   CARDIAC CATHETERIZATION N/A 12/23/2015   Procedure: Left Heart Cath and Coronary Angiography;  Surgeon: Marcina Millard, MD;  Location: [facility] INVASIVE CV LAB;  Service: Cardiovascular;  Laterality: N/A;   CARDIAC CATHETERIZATION N/A 12/23/2015   Procedure: Coronary Stent Intervention;  Surgeon: Marcina Millard, MD;  Location: [facility] INVASIVE CV LAB;  Service: Cardiovascular;  Laterality: N/A;   CATARACT EXTRACTION W/PHACO Right 07/27/2017   Procedure: CATARACT EXTRACTION PHACO AND INTRAOCULAR LENS PLACEMENT (IOC) RIGHT SYMFONY LENS;  Surgeon: Lockie Mola, MD;  Location: [facility];  Service: Ophthalmology;  Laterality: Right;   CATARACT EXTRACTION W/PHACO Left 08/10/2017   Procedure: CATARACT EXTRACTION PHACO AND INTRAOCULAR LENS PLACEMENT  (IOC) LEFT SYMFONY TORIC LENS;  Surgeon: Lockie Mola, MD;  Location: [facility];  Service: Ophthalmology;  Laterality: Left;   COLONOSCOPY     MELANOMA EXCISION  05/13/2021   Family History  Problem Relation Age of Onset   Hyperlipidemia Mother    Heart disease Mother    Cancer Father        lung   Sudden death Brother    Arthritis Maternal Grandmother    Social History   Socioeconomic History   Marital status: Married    Spouse name: Not on file   Number of children: Not on file   Years of education: Not on file   Highest education level: Not on file  Occupational History   Not on file  Tobacco Use   Smoking status: Former   Smokeless tobacco: Never   Tobacco comments:    smoked for approx 1 yr around age 49  Vaping Use   Vaping status: Never Used  Substance and Sexual Activity   Alcohol use: No    Alcohol/week: 0.0 standard drinks of alcohol   Drug use: No   Sexual activity: Not on file  Other Topics Concern   Not on file  Social History Narrative   Not on file   Social Determinants of Health   Financial Resource Strain: Low Risk  (12/23/2022)   Overall Financial Resource Strain (CARDIA)    Difficulty of Paying Living Expenses: Not very hard  Food Insecurity: No Food Insecurity (12/23/2022)   Hunger Vital Sign    Worried About Running  Out of Food in the Last Year: Never true    Ran Out of Food in the Last Year: Never true  Transportation Needs: Not on file  Physical Activity: Insufficiently Active (12/23/2022)   Exercise Vital Sign    Days of Exercise per Week: 5 days    Minutes of Exercise per Session: 10 min  Stress: No Stress Concern Present (12/23/2022)   [employer] of Occupational Health - Occupational Stress Questionnaire    Feeling of Stress : Not at all  Social Connections: Moderately Isolated (12/23/2022)   Social Connection and Isolation Panel [NHANES]    Frequency of Communication with Friends and Family: More than three  times a week    Frequency of Social Gatherings with Friends and Family: Three times a week    Attends Religious Services: Never    Active Member of Clubs or Organizations: No    Attends Banker Meetings: Never    Marital Status: Married     Review of Systems  Constitutional:  Negative for appetite change and unexpected weight change.  HENT:  Negative for congestion and sinus pressure.   Respiratory:  Negative for cough, chest tightness and shortness of breath.   Cardiovascular:  Negative for chest pain and palpitations.  Gastrointestinal:  Negative for abdominal pain, diarrhea, nausea and vomiting.  Genitourinary:  Negative for difficulty urinating and dysuria.  Musculoskeletal:  Negative for joint swelling and myalgias.  Skin:        Minimal erythema sacral region.  Open area.    Neurological:  Negative for dizziness and headaches.  Psychiatric/Behavioral:  Negative for agitation and dysphoric mood.        Objective:     BP 136/74   Pulse (!) 56   Temp 98.2 F (36.8 C)   Resp 16   Ht 5\' 1"  (1.549 m)   Wt 165 lb (74.8 kg)   SpO2 98%   BMI 31.18 kg/m  Wt Readings from Last 3 Encounters:  07/13/23 165 lb (74.8 kg)  11/03/22 165 lb (74.8 kg)  05/24/22 165 lb (74.8 kg)    Physical Exam Vitals reviewed.  Constitutional:      General: She is not in acute distress.    Appearance: Normal appearance.  HENT:     Head: Normocephalic and atraumatic.     Right Ear: External ear normal.     Left Ear: External ear normal.  Eyes:     General: No scleral icterus.       Right eye: No discharge.        Left eye: No discharge.     Conjunctiva/sclera: Conjunctivae normal.  Neck:     Thyroid: No thyromegaly.  Cardiovascular:     Rate and Rhythm: Normal rate and regular rhythm.  Pulmonary:     Effort: No respiratory distress.     Breath sounds: Normal breath sounds. No wheezing.  Abdominal:     General: Bowel sounds are normal.     Palpations: Abdomen is soft.      Tenderness: There is no abdominal tenderness.  Musculoskeletal:        General: No swelling or tenderness.     Cervical back: Neck supple. No tenderness.  Lymphadenopathy:     Cervical: No cervical adenopathy.  Skin:    Comments: Sacral erythema - with open area.  No drainage.   Neurological:     Mental Status: She is alert.  Psychiatric:        Mood and Affect: Mood normal.  Behavior: Behavior normal.      Outpatient Encounter Medications as of 07/13/2023  Medication Sig   Evolocumab (REPATHA SURECLICK) 140 MG/ML SOAJ Inject 140 mg into the skin every 14 (fourteen) days.   silver sulfADIAZINE (SILVADENE) 1 % cream Apply 1 Application topically daily.   apixaban (ELIQUIS) 2.5 MG TABS tablet Take 1 tablet (2.5 mg total) by mouth 2 (two) times daily.   aspirin 81 MG EC tablet Take by mouth.   Cholecalciferol (VITAMIN D-1000 MAX ST) 25 MCG (1000 UT) tablet Take 1,000 Units by mouth daily.    irbesartan (AVAPRO) 75 MG tablet Take 75 mg by mouth daily.   Melatonin 5 MG TBDP Take 5 mg by mouth at bedtime.   metoprolol tartrate (LOPRESSOR) 25 MG tablet Take 0.5 tablets (12.5 mg total) by mouth 2 (two) times daily.   mirtazapine (REMERON) 30 MG tablet Take 1 tablet (30 mg total) by mouth at bedtime.   pantoprazole (PROTONIX) 40 MG tablet Take 40 mg by mouth.   vitamin C (ASCORBIC ACID) 250 MG tablet Take 250 mg by mouth daily.   [DISCONTINUED] cefdinir (OMNICEF) 300 MG capsule Take 1 capsule (300 mg total) by mouth 2 (two) times daily.   No facility-administered encounter medications on file as of 07/13/2023.     Lab Results  Component Value Date   WBC 6.0 02/01/2022   HGB 11.8 (L) 02/01/2022   HCT 36.9 02/01/2022   PLT 300 02/01/2022   GLUCOSE 92 03/04/2023   CHOL 323 (H) 11/03/2022   TRIG 139.0 11/03/2022   HDL 54.00 11/03/2022   LDLCALC 241 (H) 11/03/2022   ALT 10 11/03/2022   AST 13 11/03/2022   NA 140 03/04/2023   K 4.8 03/04/2023   CL 102 03/04/2023    CREATININE 1.07 (H) 03/04/2023   BUN 34 (H) 03/04/2023   CO2 19 (L) 03/04/2023   TSH 2.10 11/03/2022   INR 0.91 08/28/2018    No results found.     Assessment & Plan:  3-vessel CAD Assessment & Plan: Followed by cardiology.  Unable to take statin medication.  Discussed starting repatha.  Nephrology - ok'd starting.  Discussed with her today.  Reports advised by cardiology - ok. .  Start repatha. Low cholesterol diet and exercise.  Follow lipid panel.    Atrial fibrillation, unspecified type [facility]) Assessment & Plan: On eliquis.  Stable.    Aortic atherosclerosis (HCC) Assessment & Plan: Unable to take statin.  Start repatha.    Dysphagia, unspecified type Assessment & Plan: Saw GI.  Swallowing better.  Wants to monitor.    Essential (primary) hypertension Assessment & Plan: Continue metoprolol and irbesartan. Follow pressures. Follow metabolic panel.    Glomerulonephritis Assessment & Plan: Followed by nephrology.     Hypercholesterolemia Assessment & Plan: Unable to take statin medication.  Received ok for nephrology and cardiology to start repatha.  Start repatha.  Low cholesterol diet and exercise.    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  Stable. In powered wheelchair.  Unable to walk. Off cellcept.    Pauci-immune RPGN (rapidly progressive glomerulonephritis) Assessment & Plan: Followed by nephrology. Off cellcept as outlined.  Continues on irbesartan.    Stress Assessment & Plan: Overall stable.    Pressure injury of sacral region, stage 1 Assessment & Plan: With sacral erythema.  Open area as outlined.  Egg crate. Position changes. Silvadene cream. Home health - wound care.  Follow.   Orders: -     Ambulatory referral  to Home Health  Other orders -     Silver sulfADIAZINE; Apply 1 Application topically daily.  Dispense: 50 g; Refill: 0 -     Repatha SureClick; Inject 140 mg into the skin every 14 (fourteen)  days.  Dispense: 2 mL; Refill: 2   I spent 45 minutes with the patient.  Time spent discussing her current concerns and symptoms. Specifically time spent discussing her cholesterol and treatment and discuss sacral skin changes.  Time also spent discussing further w/up, evaluation and treatment.    Dale Apalachin, MD

## 2023-07-13 NOTE — Assessment & Plan Note (Signed)
Overall stable.   

## 2023-07-13 NOTE — Assessment & Plan Note (Signed)
Previously treated with IVIG.  Stable. In powered wheelchair.  Unable to walk. Off cellcept.

## 2023-07-13 NOTE — Assessment & Plan Note (Signed)
Saw GI.  Swallowing better.  Wants to monitor.

## 2023-07-13 NOTE — Assessment & Plan Note (Signed)
Continue metoprolol and irbesartan. Follow pressures. Follow metabolic panel.

## 2023-07-13 NOTE — Assessment & Plan Note (Signed)
With sacral erythema.  Open area as outlined.  Egg crate. Position changes. Silvadene cream. Home health - wound care.  Follow.

## 2023-07-13 NOTE — Assessment & Plan Note (Signed)
Followed by nephrology. 

## 2023-07-13 NOTE — Assessment & Plan Note (Signed)
On eliquis. Stable.

## 2023-07-13 NOTE — Assessment & Plan Note (Signed)
Unable to take statin.  Start repatha.

## 2023-07-13 NOTE — Assessment & Plan Note (Signed)
Followed by cardiology.  Unable to take statin medication.  Discussed starting repatha.  Nephrology - ok'd starting.  Discussed with her today.  Reports advised by cardiology - ok. .  Start repatha. Low cholesterol diet and exercise.  Follow lipid panel.

## 2023-07-13 NOTE — Assessment & Plan Note (Signed)
Unable to take statin medication.  Received ok for nephrology and cardiology to start repatha.  Start repatha.  Low cholesterol diet and exercise.

## 2023-07-13 NOTE — Assessment & Plan Note (Signed)
Followed by nephrology. Off cellcept as outlined.  Continues on irbesartan.

## 2023-07-19 ENCOUNTER — Telehealth: Payer: Self-pay | Admitting: Internal Medicine

## 2023-07-19 NOTE — Telephone Encounter (Signed)
I have already placed the order for the referral. Please cancel.  Thanks

## 2023-07-19 NOTE — Telephone Encounter (Signed)
[facility] just called and said Leah Espinoza was given some ointment for her wound. She said she feels now that it is almost healed and she doesn't need the referral anymore right now with [facility]. She wanted to let her provider know. Her number is [phone].

## 2023-07-19 NOTE — Telephone Encounter (Signed)
FYI

## 2023-08-02 ENCOUNTER — Other Ambulatory Visit (HOSPITAL_COMMUNITY): Payer: Self-pay

## 2023-08-02 ENCOUNTER — Telehealth: Payer: Self-pay

## 2023-08-02 NOTE — Telephone Encounter (Signed)
Pharmacy Patient Advocate Encounter   Received notification from Patient Pharmacy that prior authorization for Repatha SureClick is required/requested.   Insurance verification completed.   The patient is insured through [facility] .   Per test claim: PA required; PA submitted to above mentioned insurance via CoverMyMeds Key/confirmation #/EOC [facility] Status is pending

## 2023-08-03 ENCOUNTER — Other Ambulatory Visit (HOSPITAL_COMMUNITY): Payer: Self-pay

## 2023-08-03 NOTE — Telephone Encounter (Signed)
Pharmacy Patient Advocate Encounter  Received notification from [facility] that Prior Authorization for Repatha SureClick 140MG /ML auto-injectors has been APPROVED from 11/03/2022 to 09/13/2023. Ran test claim, Copay is $138.05. This test claim was processed through [facility]- copay amounts may vary at other pharmacies due to pharmacy/plan contracts, or as the patient moves through the different stages of their insurance plan.   PA #/Case ID/Reference #:  [id number]   Pharmacy notified.

## 2023-08-04 DIAGNOSIS — M6282 Rhabdomyolysis: Secondary | ICD-10-CM | POA: Diagnosis not present

## 2023-08-04 DIAGNOSIS — I2510 Atherosclerotic heart disease of native coronary artery without angina pectoris: Secondary | ICD-10-CM | POA: Diagnosis not present

## 2023-08-04 DIAGNOSIS — N189 Chronic kidney disease, unspecified: Secondary | ICD-10-CM | POA: Diagnosis not present

## 2023-08-04 DIAGNOSIS — C4362 Malignant melanoma of left upper limb, including shoulder: Secondary | ICD-10-CM | POA: Diagnosis not present

## 2023-08-04 DIAGNOSIS — Z7901 Long term (current) use of anticoagulants: Secondary | ICD-10-CM | POA: Diagnosis not present

## 2023-08-04 DIAGNOSIS — N170 Acute kidney failure with tubular necrosis: Secondary | ICD-10-CM | POA: Diagnosis not present

## 2023-08-04 DIAGNOSIS — E785 Hyperlipidemia, unspecified: Secondary | ICD-10-CM | POA: Diagnosis not present

## 2023-08-04 DIAGNOSIS — N059 Unspecified nephritic syndrome with unspecified morphologic changes: Secondary | ICD-10-CM | POA: Diagnosis not present

## 2023-08-04 DIAGNOSIS — Z79899 Other long term (current) drug therapy: Secondary | ICD-10-CM | POA: Diagnosis not present

## 2023-08-04 DIAGNOSIS — Z7982 Long term (current) use of aspirin: Secondary | ICD-10-CM | POA: Diagnosis not present

## 2023-08-04 DIAGNOSIS — Z23 Encounter for immunization: Secondary | ICD-10-CM | POA: Diagnosis not present

## 2023-08-04 DIAGNOSIS — G729 Myopathy, unspecified: Secondary | ICD-10-CM | POA: Diagnosis not present

## 2023-08-04 DIAGNOSIS — I129 Hypertensive chronic kidney disease with stage 1 through stage 4 chronic kidney disease, or unspecified chronic kidney disease: Secondary | ICD-10-CM | POA: Diagnosis not present

## 2023-08-09 DIAGNOSIS — D2261 Melanocytic nevi of right upper limb, including shoulder: Secondary | ICD-10-CM | POA: Diagnosis not present

## 2023-08-09 DIAGNOSIS — L538 Other specified erythematous conditions: Secondary | ICD-10-CM | POA: Diagnosis not present

## 2023-08-09 DIAGNOSIS — Z85828 Personal history of other malignant neoplasm of skin: Secondary | ICD-10-CM | POA: Diagnosis not present

## 2023-08-09 DIAGNOSIS — L218 Other seborrheic dermatitis: Secondary | ICD-10-CM | POA: Diagnosis not present

## 2023-08-09 DIAGNOSIS — B078 Other viral warts: Secondary | ICD-10-CM | POA: Diagnosis not present

## 2023-08-09 DIAGNOSIS — D2262 Melanocytic nevi of left upper limb, including shoulder: Secondary | ICD-10-CM | POA: Diagnosis not present

## 2023-08-09 DIAGNOSIS — L821 Other seborrheic keratosis: Secondary | ICD-10-CM | POA: Diagnosis not present

## 2023-08-09 DIAGNOSIS — L400 Psoriasis vulgaris: Secondary | ICD-10-CM | POA: Diagnosis not present

## 2023-08-09 DIAGNOSIS — D225 Melanocytic nevi of trunk: Secondary | ICD-10-CM | POA: Diagnosis not present

## 2023-08-19 ENCOUNTER — Other Ambulatory Visit: Payer: Self-pay | Admitting: Internal Medicine

## 2023-09-09 ENCOUNTER — Other Ambulatory Visit: Payer: Self-pay | Admitting: Internal Medicine

## 2023-11-08 DIAGNOSIS — N057 Unspecified nephritic syndrome with diffuse crescentic glomerulonephritis: Secondary | ICD-10-CM | POA: Diagnosis not present

## 2023-11-08 DIAGNOSIS — E785 Hyperlipidemia, unspecified: Secondary | ICD-10-CM | POA: Diagnosis not present

## 2023-11-08 DIAGNOSIS — N189 Chronic kidney disease, unspecified: Secondary | ICD-10-CM | POA: Diagnosis not present

## 2023-11-08 DIAGNOSIS — I10 Essential (primary) hypertension: Secondary | ICD-10-CM | POA: Diagnosis not present

## 2023-11-08 DIAGNOSIS — N058 Unspecified nephritic syndrome with other morphologic changes: Secondary | ICD-10-CM | POA: Diagnosis not present

## 2023-11-08 DIAGNOSIS — N019 Rapidly progressive nephritic syndrome with unspecified morphologic changes: Secondary | ICD-10-CM | POA: Diagnosis not present

## 2023-11-08 DIAGNOSIS — G7289 Other specified myopathies: Secondary | ICD-10-CM | POA: Diagnosis not present

## 2023-11-09 ENCOUNTER — Other Ambulatory Visit: Payer: Self-pay | Admitting: Internal Medicine

## 2023-11-14 ENCOUNTER — Encounter: Payer: Self-pay | Admitting: Internal Medicine

## 2023-11-14 ENCOUNTER — Ambulatory Visit (INDEPENDENT_AMBULATORY_CARE_PROVIDER_SITE_OTHER): Payer: Medicare HMO | Admitting: Internal Medicine

## 2023-11-14 VITALS — BP 128/74 | HR 60 | Temp 97.8°F | Resp 16 | Ht 61.0 in | Wt 165.0 lb

## 2023-11-14 DIAGNOSIS — I2510 Atherosclerotic heart disease of native coronary artery without angina pectoris: Secondary | ICD-10-CM

## 2023-11-14 DIAGNOSIS — D649 Anemia, unspecified: Secondary | ICD-10-CM

## 2023-11-14 DIAGNOSIS — E7800 Pure hypercholesterolemia, unspecified: Secondary | ICD-10-CM

## 2023-11-14 DIAGNOSIS — I10 Essential (primary) hypertension: Secondary | ICD-10-CM | POA: Diagnosis not present

## 2023-11-14 DIAGNOSIS — R1310 Dysphagia, unspecified: Secondary | ICD-10-CM

## 2023-11-14 DIAGNOSIS — I700 Atherosclerosis of aorta: Secondary | ICD-10-CM | POA: Diagnosis not present

## 2023-11-14 DIAGNOSIS — Z83719 Family history of colon polyps, unspecified: Secondary | ICD-10-CM | POA: Diagnosis not present

## 2023-11-14 DIAGNOSIS — I4891 Unspecified atrial fibrillation: Secondary | ICD-10-CM | POA: Diagnosis not present

## 2023-11-14 DIAGNOSIS — G7289 Other specified myopathies: Secondary | ICD-10-CM

## 2023-11-14 DIAGNOSIS — N019 Rapidly progressive nephritic syndrome with unspecified morphologic changes: Secondary | ICD-10-CM | POA: Diagnosis not present

## 2023-11-14 NOTE — Progress Notes (Unsigned)
 Subjective:    Patient ID: Leah Espinoza, female    DOB: 12-Jun-1956, 68 y.o.   MRN: [id number]  Patient here for  Chief Complaint  Patient presents with   Medical Management of Chronic Issues    HPI Here for a scheduled follow up - follow up regarding CAD, afib, hypertension and hypercholesterolemia. She is accompanied by her husband. History obtained from both of them. 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 Last evaluated 11/08/23. Also followed by rheumatology - severe necrotizing myopathy. Off ceelcept. S/p covid after Christmas and then had noravirus. After having noravirus, had another episode of abdominal pain. Concerned was impacted. Stool softener. Loose stool. She is eating. Breathing stable.    Past Medical History:  Diagnosis Date   Family history of adverse reaction to anesthesia    Sister - PONV   History of chicken pox    History of fainting    Hyperlipidemia    Hypertension    MI (myocardial infarction) (HCC)    Motion sickness    circular motion   Vertigo    no episodes for several months   Past Surgical History:  Procedure Laterality Date   CARDIAC CATHETERIZATION N/A 12/23/2015   Procedure: Left Heart Cath and Coronary Angiography;  Surgeon: Marcina Millard, MD;  Location: [facility] INVASIVE CV LAB;  Service: Cardiovascular;  Laterality: N/A;   CARDIAC CATHETERIZATION N/A 12/23/2015   Procedure: Coronary Stent Intervention;  Surgeon: Marcina Millard, MD;  Location: [facility] INVASIVE CV LAB;  Service: Cardiovascular;  Laterality: N/A;   CATARACT EXTRACTION W/PHACO Right 07/27/2017   Procedure: CATARACT EXTRACTION PHACO AND INTRAOCULAR LENS PLACEMENT (IOC) RIGHT SYMFONY LENS;  Surgeon: Lockie Mola, MD;  Location: [facility];  Service: Ophthalmology;  Laterality: Right;   CATARACT EXTRACTION W/PHACO Left 08/10/2017   Procedure: CATARACT EXTRACTION PHACO AND INTRAOCULAR LENS PLACEMENT (IOC) LEFT  SYMFONY TORIC LENS;  Surgeon: Lockie Mola, MD;  Location: [facility];  Service: Ophthalmology;  Laterality: Left;   COLONOSCOPY     MELANOMA EXCISION  05/13/2021   Family History  Problem Relation Age of Onset   Hyperlipidemia Mother    Heart disease Mother    Cancer Father        lung   Sudden death Brother    Arthritis Maternal Grandmother    Social History   Socioeconomic History   Marital status: Married    Spouse name: Not on file   Number of children: Not on file   Years of education: Not on file   Highest education level: Not on file  Occupational History   Not on file  Tobacco Use   Smoking status: Former   Smokeless tobacco: Never   Tobacco comments:    smoked for approx 1 yr around age 47  Vaping Use   Vaping status: Never Used  Substance and Sexual Activity   Alcohol use: No    Alcohol/week: 0.0 standard drinks of alcohol   Drug use: No   Sexual activity: Not on file  Other Topics Concern   Not on file  Social History Narrative   Not on file   Social Drivers of Health   Financial Resource Strain: Low Risk  (12/23/2022)   Overall Financial Resource Strain (CARDIA)    Difficulty of Paying Living Expenses: Not very hard  Food Insecurity: No Food Insecurity (12/23/2022)   Hunger Vital Sign    Worried About Running Out of Food in the Last Year: Never true  Ran Out of Food in the Last Year: Never true  Transportation Needs: Not on file  Physical Activity: Insufficiently Active (12/23/2022)   Exercise Vital Sign    Days of Exercise per Week: 5 days    Minutes of Exercise per Session: 10 min  Stress: No Stress Concern Present (12/23/2022)   [employer] of Occupational Health - Occupational Stress Questionnaire    Feeling of Stress : Not at all  Social Connections: Moderately Isolated (12/23/2022)   Social Connection and Isolation Panel [NHANES]    Frequency of Communication with Friends and Family: More than three times a week     Frequency of Social Gatherings with Friends and Family: Three times a week    Attends Religious Services: Never    Active Member of Clubs or Organizations: No    Attends Banker Meetings: Never    Marital Status: Married     Review of Systems  Constitutional:  Negative for fever and unexpected weight change.  HENT:  Negative for congestion and sinus pressure.   Respiratory:  Negative for cough and chest tightness.        Breathing stable.   Cardiovascular:  Negative for chest pain and palpitations.  Gastrointestinal:  Negative for vomiting.       Recent abdominal discomfort and GI issues as outlined.   Genitourinary:  Negative for dysuria.  Musculoskeletal:  Negative for joint swelling and myalgias.  Skin:  Negative for color change and rash.  Neurological:  Negative for dizziness and headaches.  Psychiatric/Behavioral:  Negative for agitation and dysphoric mood.        Objective:     BP 128/74   Pulse 60   Temp 97.8 F (36.6 C)   Resp 16   Ht 5\' 1"  (1.549 m)   Wt 165 lb (74.8 kg)   SpO2 98%   BMI 31.18 kg/m  Wt Readings from Last 3 Encounters:  11/14/23 165 lb (74.8 kg)  07/13/23 165 lb (74.8 kg)  11/03/22 165 lb (74.8 kg)    Physical Exam Vitals reviewed.  Constitutional:      General: She is not in acute distress.    Appearance: Normal appearance.  HENT:     Head: Normocephalic and atraumatic.     Right Ear: External ear normal.     Left Ear: External ear normal.  Eyes:     General: No scleral icterus.       Right eye: No discharge.        Left eye: No discharge.     Conjunctiva/sclera: Conjunctivae normal.  Neck:     Thyroid: No thyromegaly.  Cardiovascular:     Rate and Rhythm: Normal rate.     Comments: Rate controlled.  Pulmonary:     Effort: No respiratory distress.     Breath sounds: Normal breath sounds. No wheezing.  Abdominal:     General: Bowel sounds are normal.     Palpations: Abdomen is soft.     Tenderness: There is no  abdominal tenderness.  Musculoskeletal:        General: No tenderness.     Cervical back: Neck supple. No tenderness.  Lymphadenopathy:     Cervical: No cervical adenopathy.  Skin:    Findings: No erythema or rash.  Neurological:     Mental Status: She is alert.  Psychiatric:        Mood and Affect: Mood normal.        Behavior: Behavior normal.  Outpatient Encounter Medications as of 11/14/2023  Medication Sig   apixaban (ELIQUIS) 2.5 MG TABS tablet Take 1 tablet (2.5 mg total) by mouth 2 (two) times daily.   aspirin 81 MG EC tablet Take by mouth.   Cholecalciferol (VITAMIN D-1000 MAX ST) 25 MCG (1000 UT) tablet Take 1,000 Units by mouth daily.    irbesartan (AVAPRO) 75 MG tablet Take 75 mg by mouth daily.   Melatonin 5 MG TBDP Take 5 mg by mouth at bedtime.   metoprolol tartrate (LOPRESSOR) 25 MG tablet Take 0.5 tablets (12.5 mg total) by mouth 2 (two) times daily.   mirtazapine (REMERON) 30 MG tablet TAKE 1 TABLET BY MOUTH AT BEDTIME   pantoprazole (PROTONIX) 40 MG tablet Take 40 mg by mouth.   REPATHA SURECLICK 140 MG/ML SOAJ INJECT 140 MG INTO THE SKIN EVERY 14 DAYS   silver sulfADIAZINE (SILVADENE) 1 % cream APPLY TOPICALLY TO AFFECTED AREAS DAILY   vitamin C (ASCORBIC ACID) 250 MG tablet Take 250 mg by mouth daily.   No facility-administered encounter medications on file as of 11/14/2023.     Lab Results  Component Value Date   WBC 6.0 02/01/2022   HGB 11.8 (L) 02/01/2022   HCT 36.9 02/01/2022   PLT 300 02/01/2022   GLUCOSE 92 03/04/2023   CHOL 323 (H) 11/03/2022   TRIG 139.0 11/03/2022   HDL 54.00 11/03/2022   LDLCALC 241 (H) 11/03/2022   ALT 10 11/03/2022   AST 13 11/03/2022   NA 140 03/04/2023   K 4.8 03/04/2023   CL 102 03/04/2023   CREATININE 1.07 (H) 03/04/2023   BUN 34 (H) 03/04/2023   CO2 19 (L) 03/04/2023   TSH 2.10 11/03/2022   INR 0.91 08/28/2018       Assessment & Plan:  Pauci-immune RPGN (rapidly progressive  glomerulonephritis) Assessment & Plan: Followed by nephrology.  Off CellCept as outlined.  Continue irbesartan.    Normocytic anemia Assessment & Plan: Hemoglobin just checked through nephrology.  Stable at 11.6.   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  Stable.  In powered wheelchair.  Unable to walk.  Off CellCept.   Hypercholesterolemia Assessment & Plan: Unable to take statin medication.  Is currently on Repatha.  Recent lipid panel significantly improved.  Discussed with her today.  She has been sick recently reports decreased p.o. intake.  Follow-up fasting lipid profile. Lab Results  Component Value Date   CHOL 323 (H) 11/03/2022   HDL 54.00 11/03/2022   LDLCALC 241 (H) 11/03/2022   TRIG 139.0 11/03/2022   CHOLHDL 6 11/03/2022      Family history of colonic polyps Assessment & Plan: Colonoscopy 12/2013.  F/u in 10 years. Will allow her to recover from recent illness.    Essential (primary) hypertension Assessment & Plan: Continue metoprolol and irbesartan.  Follow pressures.  Follow metabolic panel.   Dysphagia, unspecified type Assessment & Plan: Previously saw GI. Wanted to monitor.    Aortic atherosclerosis (HCC) Assessment & Plan: Continue repatha.    Atrial fibrillation, unspecified type [facility]) Assessment & Plan: Continues on eliquis and metoprolol. Stable.    3-vessel CAD Assessment & Plan: Has been followed by cardiology. On repatha. Continue risk factor modification.       Dale Trimble, MD

## 2023-11-19 ENCOUNTER — Encounter: Payer: Self-pay | Admitting: Internal Medicine

## 2023-11-20 NOTE — Assessment & Plan Note (Signed)
Continue metoprolol and irbesartan. Follow pressures. Follow metabolic panel.

## 2023-11-20 NOTE — Assessment & Plan Note (Signed)
 Has been followed by cardiology. On repatha. Continue risk factor modification.

## 2023-11-20 NOTE — Assessment & Plan Note (Signed)
 Unable to take statin medication.  Is currently on Repatha.  Recent lipid panel significantly improved.  Discussed with her today.  She has been sick recently reports decreased p.o. intake.  Follow-up fasting lipid profile. Lab Results  Component Value Date   CHOL 323 (H) 11/03/2022   HDL 54.00 11/03/2022   LDLCALC 241 (H) 11/03/2022   TRIG 139.0 11/03/2022   CHOLHDL 6 11/03/2022

## 2023-11-20 NOTE — Assessment & Plan Note (Signed)
 Hemoglobin just checked through nephrology.  Stable at 11.6.

## 2023-11-20 NOTE — Assessment & Plan Note (Signed)
 Colonoscopy 12/2013.  F/u in 10 years. Will allow her to recover from recent illness.

## 2023-11-20 NOTE — Assessment & Plan Note (Addendum)
Continues on eliquis and metoprolol.  Stable.

## 2023-11-20 NOTE — Assessment & Plan Note (Signed)
 Followed by nephrology.  Off CellCept as outlined.  Continue irbesartan.

## 2023-11-20 NOTE — Assessment & Plan Note (Signed)
 Previously saw GI. Wanted to monitor.

## 2023-11-20 NOTE — Assessment & Plan Note (Signed)
Continue repatha.

## 2023-11-20 NOTE — Assessment & Plan Note (Signed)
 Previously treated with IVIG.  Stable.  In powered wheelchair.  Unable to walk.  Off CellCept.

## 2023-11-25 ENCOUNTER — Ambulatory Visit: Payer: Self-pay | Admitting: Internal Medicine

## 2023-11-25 NOTE — Telephone Encounter (Signed)
 Chief Complaint: Constipation Symptoms: constipation Frequency: patient states 3 weeks Pertinent Negatives: Patient denies abdominal pain, nausea, vomiting, distended abdomen Disposition: [] ED /[] Urgent Care (no appt availability in office) / [] Appointment(In office/virtual)/ []  [facility]/ [] Home Care/ [] Refused Recommended Disposition /[] [facility]/ [x]  Follow-up with PCP Additional Notes: patient called with concerns of being constipated for three weeks. Patient endorses passing gas and possible very small bowel movements but patient is unsure of bowel movements. Patient stated this happened to her about four weeks ago where she had abdominal pain and nausea-her husband assisted her with an enema and she was able to go. Patient states she has only tried Colace 3 separate times. Patient denies abdominal pain, nausea, vomiting, or a distended abdomen. Per protocol, patient is recommended to be seen in three days. Unable to schedule patient due to availability and location. Care Advise included the use of Miralax, Milk of Magnesia and Colace along with adequate fluid intake and proper nutrition. Patient verbalized understanding of plan and all questions answered. Patient would like a follow up call from PCP.    Copied from CRM [id number]. Topic: Clinical - Medical Advice >> Nov 25, 2023  3:47 PM Isabelle Course C wrote: Reason for CRM: Patient stated that she has not had bile movement in over 3 weeks and needs some advice on what to do. Reason for Disposition  [1] Constipation persists > 1 week AND [2] no improvement after using Care Advice  Answer Assessment - Initial Assessment Questions 1. STOOL PATTERN OR FREQUENCY: "How often do you have a bowel movement (BM)?"  (Normal range: 3 times a day to every 3 days)  "When was your last BM?"       Normally every day-patient states she is unsure of the last time she had a bowel movement, patient thinks three weeks ago 2. STRAINING: "Do you  have to strain to have a BM?"      Yes-hasn't been successful 3. RECTAL PAIN: "Does your rectum hurt when the stool comes out?" If Yes, ask: "Do you have hemorrhoids? How bad is the pain?"  (Scale 1-10; or mild, moderate, severe)     Yes 4. STOOL COMPOSITION: "Are the stools hard?"      yes 5. BLOOD ON STOOLS: "Has there been any blood on the toilet tissue or on the surface of the BM?" If Yes, ask: "When was the last time?"     Not that she knows of 6. CHRONIC CONSTIPATION: "Is this a new problem for you?"  If No, ask: "How long have you had this problem?" (days, weeks, months)      Possibly but patient was constipated three-four weeks ago 7. CHANGES IN DIET OR HYDRATION: "Have there been any recent changes in your diet?" "How much fluids are you drinking on a daily basis?"  "How much have you had to drink today?"     Patient states she had been sick with Noro virus and wasn't eating a lot of food for about 2-3 weeks 8. MEDICINES: "Have you been taking any new medicines?" "Are you taking any narcotic pain medicines?" (e.g., Dilaudid, morphine, Percocet, Vicodin)     No 9. LAXATIVES: "Have you been using any stool softeners, laxatives, or enemas?"  If Yes, ask "What, how often, and when was the last time?"     Stool softners-Colace only taken 2-3 times 10. ACTIVITY:  "How much walking do you do every day?"  "Has your activity level decreased in the past week?"  Patient is in a wheelchair 11. CAUSE: "What do you think is causing the constipation?"        unsure 12. OTHER SYMPTOMS: "Do you have any other symptoms?" (e.g., abdomen pain, bloating, fever, vomiting)       Little abdominal pain 13. MEDICAL HISTORY: "Do you have a history of hemorrhoids, rectal fissures, or rectal surgery or rectal abscess?"         No  Protocols used: Constipation-A-AH

## 2023-11-25 NOTE — Telephone Encounter (Signed)
 Please call and confirm how long since she had a bowel movement. Would recommend a suppository or enemia - to have a bowel movement and then once move - miralax daily. Can schedule f/u appt , but please notify of above. If acute problems, needs evaluation.

## 2023-11-25 NOTE — Telephone Encounter (Signed)
 Spoke with patient and says she is not sure, but it has been at least three weeks since her last bowel movement. Patient says she has only tried a stool softener. Patient verbalized understanding. Patient is scheduled for Wednesday March 19th virtually, as she states she wanted a few days to work on the issue.

## 2023-11-26 NOTE — Telephone Encounter (Signed)
 In reviewing, she has been passing small bowel movements for the past three weeks.  She wanted to try the suppository and/or enemas. Please call and get update on how she is doing.

## 2023-11-28 NOTE — Telephone Encounter (Signed)
 Patient reports still no bowel movement. No belly pain, nausea, vomiting,etc. Patient reports that she does not think she can do the enema or suppository but she is using the miralax and stool softeners. She has been scheduled for tomorrow.

## 2023-11-29 ENCOUNTER — Telehealth (INDEPENDENT_AMBULATORY_CARE_PROVIDER_SITE_OTHER): Admitting: Internal Medicine

## 2023-11-29 DIAGNOSIS — K5900 Constipation, unspecified: Secondary | ICD-10-CM | POA: Diagnosis not present

## 2023-11-29 DIAGNOSIS — I10 Essential (primary) hypertension: Secondary | ICD-10-CM | POA: Diagnosis not present

## 2023-11-29 NOTE — Progress Notes (Signed)
 Patient ID: Leah Espinoza, female   DOB: November 28, 1955, 68 y.o.   MRN: [id number]   Virtual Visit via video Note  I connected with [facility] by a video enabled telemedicine application and verified that I am speaking with the correct person using two identifiers. Location patient: home Location provider: work Persons participating in the virtual visit: patient, provider  The limitations, risks, security and privacy concerns of performing an evaluation and management service by video and the availability of in person appointments have been discussed. It has also been discussed with the patient that there may be a patient responsible charge related to this service. The patient expressed understanding and agreed to proceed.   Reason for visit: work in appt  HPI: Work in with concerns regarding persistent constipation.  She reports she is now using miralax. Had small bowel movement today. States she has not had a good bowel movement in 3 weeks. Denies any nausea or vomiting. Denies abdominal pain. States she is eating and reports her appetite is normal. Discussed treatment options. Discussed suppositories and staying hydrated. Discussed enemas.    ROS: See pertinent positives and negatives per HPI.  Past Medical History:  Diagnosis Date   Family history of adverse reaction to anesthesia    Sister - PONV   History of chicken pox    History of fainting    Hyperlipidemia    Hypertension    MI (myocardial infarction) (HCC)    Motion sickness    circular motion   Vertigo    no episodes for several months    Past Surgical History:  Procedure Laterality Date   CARDIAC CATHETERIZATION N/A 12/23/2015   Procedure: Left Heart Cath and Coronary Angiography;  Surgeon: Marcina Millard, MD;  Location: [facility] INVASIVE CV LAB;  Service: Cardiovascular;  Laterality: N/A;   CARDIAC CATHETERIZATION N/A 12/23/2015   Procedure: Coronary Stent Intervention;  Surgeon: Marcina Millard, MD;   Location: [facility] INVASIVE CV LAB;  Service: Cardiovascular;  Laterality: N/A;   CATARACT EXTRACTION W/PHACO Right 07/27/2017   Procedure: CATARACT EXTRACTION PHACO AND INTRAOCULAR LENS PLACEMENT (IOC) RIGHT SYMFONY LENS;  Surgeon: Lockie Mola, MD;  Location: [facility];  Service: Ophthalmology;  Laterality: Right;   CATARACT EXTRACTION W/PHACO Left 08/10/2017   Procedure: CATARACT EXTRACTION PHACO AND INTRAOCULAR LENS PLACEMENT (IOC) LEFT SYMFONY TORIC LENS;  Surgeon: Lockie Mola, MD;  Location: [facility];  Service: Ophthalmology;  Laterality: Left;   COLONOSCOPY     MELANOMA EXCISION  05/13/2021    Family History  Problem Relation Age of Onset   Hyperlipidemia Mother    Heart disease Mother    Cancer Father        lung   Sudden death Brother    Arthritis Maternal Grandmother     SOCIAL HX: reviewed.    Current Outpatient Medications:    apixaban (ELIQUIS) 2.5 MG TABS tablet, Take 1 tablet (2.5 mg total) by mouth 2 (two) times daily., Disp: 180 tablet, Rfl: 1   aspirin 81 MG EC tablet, Take by mouth., Disp: , Rfl:    Cholecalciferol (VITAMIN D-1000 MAX ST) 25 MCG (1000 UT) tablet, Take 1,000 Units by mouth daily. , Disp: , Rfl:    irbesartan (AVAPRO) 75 MG tablet, Take 75 mg by mouth daily., Disp: , Rfl:    Melatonin 5 MG TBDP, Take 5 mg by mouth at bedtime., Disp: , Rfl:    metoprolol tartrate (LOPRESSOR) 25 MG tablet, Take 0.5 tablets (12.5 mg total) by mouth 2 (two) times  daily., Disp: 30 tablet, Rfl: 5   mirtazapine (REMERON) 30 MG tablet, TAKE 1 TABLET BY MOUTH AT BEDTIME, Disp: 90 tablet, Rfl: 1   pantoprazole (PROTONIX) 40 MG tablet, Take 40 mg by mouth., Disp: , Rfl:    REPATHA SURECLICK 140 MG/ML SOAJ, INJECT 140 MG INTO THE SKIN EVERY 14 DAYS, Disp: 2 mL, Rfl: 2   silver sulfADIAZINE (SILVADENE) 1 % cream, APPLY TOPICALLY TO AFFECTED AREAS DAILY, Disp: 50 g, Rfl: 0   vitamin C (ASCORBIC ACID) 250 MG tablet, Take 250 mg by mouth daily.,  Disp: , Rfl:   EXAM:  GENERAL: alert, oriented, appears well and in no acute distress  HEENT: atraumatic, conjunttiva clear, no obvious abnormalities on inspection of external nose and ears  NECK: normal movements of the head and neck  LUNGS: on inspection no signs of respiratory distress, breathing rate appears normal, no obvious gross SOB, gasping or wheezing  CV: no obvious cyanosis  PSYCH/NEURO: pleasant and cooperative, no obvious depression or anxiety, speech and thought processing grossly intact  ASSESSMENT AND PLAN:  Discussed the following assessment and plan:  Problem List Items Addressed This Visit     Constipation - Primary   Persistent issues with constipation as outlined. Stay hydrated. Has been taking miralax. Given length of time with no bowel movement, discussed the need for suppository or enema. Once bowel movement, continue miralax. Follow. Denies abdominal pain, nausea or vomiting. Call with update.       Essential (primary) hypertension   Continue metoprolol and irbesartan.  Follow pressures.        Return if symptoms worsen or fail to improve.   I discussed the assessment and treatment plan with the patient. The patient was provided an opportunity to ask questions and all were answered. The patient agreed with the plan and demonstrated an understanding of the instructions.   The patient was advised to call back or seek an in-person evaluation if the symptoms worsen or if the condition fails to improve as anticipated.    Dale Belding, MD

## 2023-11-30 ENCOUNTER — Telehealth: Admitting: Internal Medicine

## 2023-12-04 ENCOUNTER — Encounter: Payer: Self-pay | Admitting: Internal Medicine

## 2023-12-04 DIAGNOSIS — K5900 Constipation, unspecified: Secondary | ICD-10-CM | POA: Insufficient documentation

## 2023-12-04 NOTE — Assessment & Plan Note (Signed)
 Continue metoprolol and irbesartan.  Follow pressures.

## 2023-12-04 NOTE — Assessment & Plan Note (Signed)
 Persistent issues with constipation as outlined. Stay hydrated. Has been taking miralax. Given length of time with no bowel movement, discussed the need for suppository or enema. Once bowel movement, continue miralax. Follow. Denies abdominal pain, nausea or vomiting. Call with update.

## 2023-12-20 DIAGNOSIS — I5181 Takotsubo syndrome: Secondary | ICD-10-CM | POA: Diagnosis not present

## 2023-12-20 DIAGNOSIS — I252 Old myocardial infarction: Secondary | ICD-10-CM | POA: Diagnosis not present

## 2023-12-20 DIAGNOSIS — I480 Paroxysmal atrial fibrillation: Secondary | ICD-10-CM | POA: Diagnosis not present

## 2023-12-20 DIAGNOSIS — E7800 Pure hypercholesterolemia, unspecified: Secondary | ICD-10-CM | POA: Diagnosis not present

## 2023-12-20 DIAGNOSIS — R0609 Other forms of dyspnea: Secondary | ICD-10-CM | POA: Diagnosis not present

## 2023-12-20 DIAGNOSIS — I10 Essential (primary) hypertension: Secondary | ICD-10-CM | POA: Diagnosis not present

## 2023-12-27 DIAGNOSIS — I5181 Takotsubo syndrome: Secondary | ICD-10-CM | POA: Diagnosis not present

## 2024-01-10 ENCOUNTER — Telehealth: Payer: Self-pay

## 2024-01-10 NOTE — Telephone Encounter (Signed)
 Paper work received today. Completed and placed out for your signature.

## 2024-01-10 NOTE — Telephone Encounter (Signed)
 Copied from CRM [id number]. Topic: General - Other >> Jan 10, 2024 11:11 AM Danae Duncans wrote: Reason for CRM: Ace Abu from [facility] called to confirm If fax was received- medical eligibility form that was fax 04/22 -   Callback number: [phone] Claim #:[id number]

## 2024-01-10 NOTE — Telephone Encounter (Signed)
 Signed and placed in box.

## 2024-01-11 NOTE — Telephone Encounter (Signed)
 Faxed to [facility]. Patient is aware.

## 2024-01-16 ENCOUNTER — Ambulatory Visit (INDEPENDENT_AMBULATORY_CARE_PROVIDER_SITE_OTHER): Admitting: *Deleted

## 2024-01-16 VITALS — Ht 61.0 in | Wt 165.0 lb

## 2024-01-16 DIAGNOSIS — Z0000 Encounter for general adult medical examination without abnormal findings: Secondary | ICD-10-CM | POA: Diagnosis not present

## 2024-01-16 DIAGNOSIS — Z1231 Encounter for screening mammogram for malignant neoplasm of breast: Secondary | ICD-10-CM

## 2024-01-16 NOTE — Patient Instructions (Signed)
 Leah Espinoza , Thank you for taking time to come for your Medicare Wellness Visit. I appreciate your ongoing commitment to your health goals. Please review the following plan we discussed and let me know if I can assist you in the future.   Referrals/Orders/Follow-Ups/Clinician Recommendations: Mammogram has been ordered. Consider updating your tetanus, covid and shingles vaccines. Complete your cologuard test and mail back. You have an order for:  []   2D Mammogram  [x]   3D Mammogram  []   Bone Density     Please call for appointment:  [facility]  [address] [place] [address] [phone]     Make sure to wear two-piece clothing.  No lotions, powders, or deodorants the day of the appointment. Make sure to bring picture ID and insurance card.  Bring list of medications you are currently taking including any supplements.    This is a list of the screening recommended for you and due dates:  Health Maintenance  Topic Date Due   Hepatitis C Screening  Never done   DTaP/Tdap/Td vaccine (1 - Tdap) Never done   DEXA scan (bone density measurement)  Never done   COVID-19 Vaccine (4 - 2024-25 season) 05/15/2023   Colon Cancer Screening  01/03/2024   Zoster (Shingles) Vaccine (1 of 2) 02/14/2024*   Mammogram  11/13/2024*   Flu Shot  04/13/2024   Medicare Annual Wellness Visit  01/15/2025   Pneumonia Vaccine  Completed   HPV Vaccine  Aged Out   Meningitis B Vaccine  Aged Out  *Topic was postponed. The date shown is not the original due date.    Advanced directives: (Declined) Advance directive discussed with you today. Even though you declined this today, please call our office should you change your mind, and we can give you the proper paperwork for you to fill out.  Next Medicare Annual Wellness Visit scheduled for next year: Yes 01/16/25 @ 1:00    Have you seen your provider in the last 6 months (3 months if uncontrolled  diabetes)? Yes 11/14/23

## 2024-01-16 NOTE — Progress Notes (Signed)
 Subjective:   Leah Espinoza is a 68 y.o. who presents for a Medicare Wellness preventive visit.  Visit Complete: Virtual I connected with  Leah Espinoza on 01/16/24 by a audio enabled telemedicine application and verified that I am speaking with the correct person using two identifiers.  Patient Location: Home  Provider Location: Office/Clinic  I discussed the limitations of evaluation and management by telemedicine. The patient expressed understanding and agreed to proceed.  Vital Signs: Because this visit was a virtual/telehealth visit, some criteria may be missing or patient reported. Any vitals not documented were not able to be obtained and vitals that have been documented are patient reported.  VideoDeclined- This patient declined Librarian, academic. Therefore the visit was completed with audio only.  Persons Participating in Visit: Patient.  AWV Questionnaire: No: Patient Medicare AWV questionnaire was not completed prior to this visit.  Cardiac Risk Factors include: advanced age (>79men, >41 women);obesity (BMI >30kg/m2);dyslipidemia;hypertension     Objective:    Today's Vitals   01/16/24 1130  Weight: 165 lb (74.8 kg)  Height: 5\' 1"  (1.549 m)   Body mass index is 31.18 kg/m.     01/16/2024   11:44 AM 12/23/2022    2:32 PM 02/01/2022    2:56 PM 01/30/2021    1:10 PM 10/03/2020    1:51 PM 12/19/2019    2:25 PM 06/05/2018    8:15 AM  Advanced Directives  Does Patient Have a Medical Advance Directive? No No No  Yes Yes No  Type of Advance Directive     Healthcare Power of [employer] Power of Attorney   Does patient want to make changes to medical advance directive?      No - Patient declined   Copy of Healthcare Power of Attorney in Chart?      No - copy requested   Would patient like information on creating a medical advance directive? No - Patient declined No - Patient declined No - Patient declined No - Patient declined  No -  Patient declined No - Patient declined    Current Medications (verified) Outpatient Encounter Medications as of 01/16/2024  Medication Sig   apixaban  (ELIQUIS ) 2.5 MG TABS tablet Take 1 tablet (2.5 mg total) by mouth 2 (two) times daily.   aspirin  81 MG EC tablet Take by mouth.   Cholecalciferol  (VITAMIN D -1000 MAX ST) 25 MCG (1000 UT) tablet Take 1,000 Units by mouth daily.    irbesartan (AVAPRO) 75 MG tablet Take 75 mg by mouth daily.   Melatonin 5 MG TBDP Take 5 mg by mouth at bedtime.   metoprolol  tartrate (LOPRESSOR ) 25 MG tablet Take 0.5 tablets (12.5 mg total) by mouth 2 (two) times daily.   pantoprazole  (PROTONIX ) 40 MG tablet Take 40 mg by mouth.   REPATHA  SURECLICK 140 MG/ML SOAJ INJECT 140 MG INTO THE SKIN EVERY 14 DAYS   vitamin C (ASCORBIC ACID) 250 MG tablet Take 250 mg by mouth daily.   mirtazapine  (REMERON ) 30 MG tablet TAKE 1 TABLET BY MOUTH AT BEDTIME (Patient not taking: Reported on 01/16/2024)   silver  sulfADIAZINE  (SILVADENE ) 1 % cream APPLY TOPICALLY TO AFFECTED AREAS DAILY (Patient not taking: Reported on 01/16/2024)   No facility-administered encounter medications on file as of 01/16/2024.    Allergies (verified) Crestor  [rosuvastatin  calcium ] and Statins   History: Past Medical History:  Diagnosis Date   Family history of adverse reaction to anesthesia    Sister - PONV   History of  chicken pox    History of fainting    Hyperlipidemia    Hypertension    MI (myocardial infarction) (HCC)    Motion sickness    circular motion   Vertigo    no episodes for several months   Past Surgical History:  Procedure Laterality Date   CARDIAC CATHETERIZATION N/A 12/23/2015   Procedure: Left Heart Cath and Coronary Angiography;  Surgeon: Percival Brace, MD;  Location: [facility] INVASIVE CV LAB;  Service: Cardiovascular;  Laterality: N/A;   CARDIAC CATHETERIZATION N/A 12/23/2015   Procedure: Coronary Stent Intervention;  Surgeon: Percival Brace, MD;  Location: [facility]  INVASIVE CV LAB;  Service: Cardiovascular;  Laterality: N/A;   CATARACT EXTRACTION W/PHACO Right 07/27/2017   Procedure: CATARACT EXTRACTION PHACO AND INTRAOCULAR LENS PLACEMENT (IOC) RIGHT SYMFONY LENS;  Surgeon: Annell Kidney, MD;  Location[facility];  Service: Ophthalmology;  Laterality: Right;   CATARACT EXTRACTION W/PHACO Left 08/10/2017   Procedure: CATARACT EXTRACTION PHACO AND INTRAOCULAR LENS PLACEMENT (IOC) LEFT SYMFONY TORIC LENS;  Surgeon: Annell Kidney, MD;  Location: [facility];  Service: Ophthalmology;  Laterality: Left;   COLONOSCOPY     MELANOMA EXCISION  05/13/2021   Family History  Problem Relation Age of Onset   Hyperlipidemia Mother    Heart disease Mother    Cancer Father        lung   Sudden death Brother    Arthritis Maternal Grandmother    Social History   Socioeconomic History   Marital status: Married    Spouse name: Not on file   Number of children: Not on file   Years of education: Not on file   Highest education level: Not on file  Occupational History   Not on file  Tobacco Use   Smoking status: Former   Smokeless tobacco: Never   Tobacco comments:    smoked for approx 1 yr around age 52  Vaping Use   Vaping status: Never Used  Substance and Sexual Activity   Alcohol use: No    Alcohol/week: 0.0 standard drinks of alcohol   Drug use: No   Sexual activity: Not on file  Other Topics Concern   Not on file  Social History Narrative   married   Social Drivers of Corporate investment banker Strain: Low Risk  (01/16/2024)   Overall Financial Resource Strain (CARDIA)    Difficulty of Paying Living Expenses: Not hard at all  Food Insecurity: No Food Insecurity (01/16/2024)   Hunger Vital Sign    Worried About Running Out of Food in the Last Year: Never true    Ran Out of Food in the Last Year: Never true  Transportation Needs: No Transportation Needs (01/16/2024)   PRAPARE - Scientist, research (physical sciences) (Medical): No    Lack of Transportation (Non-Medical): No  Physical Activity: Inactive (01/16/2024)   Exercise Vital Sign    Days of Exercise per Week: 0 days    Minutes of Exercise per Session: 0 min  Stress: No Stress Concern Present (01/16/2024)   [employer] of Occupational Health - Occupational Stress Questionnaire    Feeling of Stress : Not at all  Social Connections: Moderately Isolated (01/16/2024)   Social Connection and Isolation Panel [NHANES]    Frequency of Communication with Friends and Family: More than three times a week    Frequency of Social Gatherings with Friends and Family: More than three times a week    Attends Religious Services: Never  Active Member of Clubs or Organizations: No    Attends Banker Meetings: Never    Marital Status: Married    Tobacco Counseling Counseling given: Not Answered Tobacco comments: smoked for approx 1 yr around age 89    Clinical Intake:  Pre-visit preparation completed: Yes  Pain : No/denies pain     BMI - recorded: 31.18 Nutritional Status: BMI > 30  Obese Nutritional Risks: None Diabetes: No  No results found for: "HGBA1C"   How often do you need to have someone help you when you read instructions, pamphlets, or other written materials from your doctor or pharmacy?: 1 - Never  Interpreter Needed?: No  Information entered by :: R. Ferguson Gertner LPN   Activities of Daily Living     01/16/2024   11:32 AM  In your present state of health, do you have any difficulty performing the following activities:  Hearing? 0  Vision? 0  Difficulty concentrating or making decisions? 0  Walking or climbing stairs? 1  Dressing or bathing? 1  Doing errands, shopping? 1  Preparing Food and eating ? Y  Comment in wheelchair  Using the Toilet? Y  In the past six months, have you accidently leaked urine? N  Do you have problems with loss of bowel control? Y  Managing your Medications? N  Managing your  Finances? Y  Housekeeping or managing your Housekeeping? Y    Patient Care Team: Dellar Fenton, MD as PCP - General (Internal Medicine) Gwyn Leos, MD as Consulting Physician (Hematology and Oncology)  Indicate any recent Medical Services you may have received from other than [facility] providers in the past year (date may be approximate).     Assessment:   This is a routine wellness examination for [facility].  Hearing/Vision screen Hearing Screening - Comments:: No issues Vision Screening - Comments:: No glasses   Goals Addressed             This Visit's Progress    Patient Stated       Would like to get where she an stand up       Depression Screen     01/16/2024   11:39 AM 12/23/2022    2:11 PM 04/09/2022    7:35 AM 05/28/2021    4:37 PM 03/12/2021    2:54 PM 10/01/2019    3:00 PM 04/25/2017    1:33 PM  PHQ 2/9 Scores  PHQ - 2 Score 0 0 0 0 0 0 0  PHQ- 9 Score 2 2   0  4    Fall Risk     01/16/2024   11:35 AM 12/23/2022    2:06 PM 04/09/2022    7:35 AM 05/28/2021    4:38 PM 03/12/2021    2:56 PM  Fall Risk   Falls in the past year? 0 0 0 0 0  Number falls in past yr: 0 0 0 0 0  Injury with Fall? 0 0 0 0 0  Risk for fall due to : No Fall Risks Impaired mobility No Fall Risks    Follow up Falls prevention discussed;Falls evaluation completed Falls evaluation completed;Education provided;Falls prevention discussed Follow up appointment Falls evaluation completed Falls evaluation completed    MEDICARE RISK AT HOME:  Medicare Risk at Home Any stairs in or around the home?: No If so, are there any without handrails?: No Home free of loose throw rugs in walkways, pet beds, electrical cords, etc?: Yes Adequate lighting in your home to reduce risk  of falls?: Yes Life alert?: No Use of a cane, walker or w/c?: Yes (wheelchair) Grab bars in the bathroom?: No Shower chair or bench in shower?: No Elevated toilet seat or a handicapped toilet?: Yes  TIMED UP AND  GO:  Was the test performed?  No  Cognitive Function: 6CIT completed        01/16/2024   11:46 AM 01/16/2024   11:44 AM 12/23/2022    2:08 PM  6CIT Screen  What Year? 0 points 0 points 0 points  What month? 0 points 0 points 0 points  What time? 0 points 0 points 0 points  Count back from 20 0 points  0 points  Months in reverse 0 points  0 points  Repeat phrase 0 points  0 points  Total Score 0 points  0 points    Immunizations Immunization History  Administered Date(s) Administered   Influenza Nasal 06/18/2013   Influenza Split 07/15/2014   Influenza, High Dose Seasonal PF 08/04/2023   Influenza-Unspecified 06/25/2015, 06/22/2016, 06/13/2018, 07/15/2019, 08/13/2021   Moderna Sars-Covid-2 Vaccination 11/28/2019, 12/26/2019, 08/21/2020   PNEUMOCOCCAL CONJUGATE-20 11/04/2021    Screening Tests Health Maintenance  Topic Date Due   Hepatitis C Screening  Never done   DTaP/Tdap/Td (1 - Tdap) Never done   DEXA SCAN  Never done   COVID-19 Vaccine (4 - 2024-25 season) 05/15/2023   Medicare Annual Wellness (AWV)  12/23/2023   Colonoscopy  01/03/2024   Zoster Vaccines- Shingrix (1 of 2) 02/14/2024 (Originally 02/11/1975)   MAMMOGRAM  11/13/2024 (Originally 08/18/2017)   INFLUENZA VACCINE  04/13/2024   Pneumonia Vaccine 26+ Years old  Completed   HPV VACCINES  Aged Out   Meningococcal B Vaccine  Aged Out    Health Maintenance  Health Maintenance Due  Topic Date Due   Hepatitis C Screening  Never done   DTaP/Tdap/Td (1 - Tdap) Never done   DEXA SCAN  Never done   COVID-19 Vaccine (4 - 2024-25 season) 05/15/2023   Medicare Annual Wellness (AWV)  12/23/2023   Colonoscopy  01/03/2024   Health Maintenance Items Addressed: Mammogram ordered, Discussed the need to update  shingles, covid and testanus vaccines. Patient declines colonoscopy but has cologuard at the home that she will consider doing. Patient unable to do a bone density/Dexa because she is in a wheelchair. Patient  stated that she will discuss with her nephrologist he she needs a Hepatitis lab test.  Additional Screening:  Vision Screening: Recommended annual ophthalmology exams for early detection of glaucoma and other disorders of the eye. Over due for eye exam. [facility]. Patient stated that she will and schedule an appointment some time this year.  Dental Screening: Recommended annual dental exams for proper oral hygiene  Community Resource Referral / Chronic Care Management: CRR required this visit?  No   CCM required this visit?  No     Plan:     I have personally reviewed and noted the following in the patient's chart:   Medical and social history Use of alcohol, tobacco or illicit drugs  Current medications and supplements including opioid prescriptions. Patient is not currently taking opioid prescriptions. Functional ability and status Nutritional status Physical activity Advanced directives List of other physicians Hospitalizations, surgeries, and ER visits in previous 12 months Vitals Screenings to include cognitive, depression, and falls Referrals and appointments  In addition, I have reviewed and discussed with patient certain preventive protocols, quality metrics, and best practice recommendations. A written personalized care plan for preventive services  as well as general preventive health recommendations were provided to patient.     Felicitas Horse, LPN   4/0/9811   After Visit Summary: (Pick Up) Due to this being a telephonic visit, with patients personalized plan was offered to patient and patient has requested to Pick up at office.  Notes: Nothing significant to report at this time.

## 2024-02-03 ENCOUNTER — Other Ambulatory Visit: Payer: Self-pay | Admitting: Internal Medicine

## 2024-02-21 DIAGNOSIS — D2262 Melanocytic nevi of left upper limb, including shoulder: Secondary | ICD-10-CM | POA: Diagnosis not present

## 2024-02-21 DIAGNOSIS — Z08 Encounter for follow-up examination after completed treatment for malignant neoplasm: Secondary | ICD-10-CM | POA: Diagnosis not present

## 2024-02-21 DIAGNOSIS — L821 Other seborrheic keratosis: Secondary | ICD-10-CM | POA: Diagnosis not present

## 2024-02-21 DIAGNOSIS — D2261 Melanocytic nevi of right upper limb, including shoulder: Secondary | ICD-10-CM | POA: Diagnosis not present

## 2024-02-21 DIAGNOSIS — Z85828 Personal history of other malignant neoplasm of skin: Secondary | ICD-10-CM | POA: Diagnosis not present

## 2024-02-21 DIAGNOSIS — D2272 Melanocytic nevi of left lower limb, including hip: Secondary | ICD-10-CM | POA: Diagnosis not present

## 2024-02-21 DIAGNOSIS — D2271 Melanocytic nevi of right lower limb, including hip: Secondary | ICD-10-CM | POA: Diagnosis not present

## 2024-02-21 DIAGNOSIS — B078 Other viral warts: Secondary | ICD-10-CM | POA: Diagnosis not present

## 2024-02-21 DIAGNOSIS — L570 Actinic keratosis: Secondary | ICD-10-CM | POA: Diagnosis not present

## 2024-02-21 DIAGNOSIS — D225 Melanocytic nevi of trunk: Secondary | ICD-10-CM | POA: Diagnosis not present

## 2024-04-23 ENCOUNTER — Other Ambulatory Visit: Payer: Self-pay | Admitting: Internal Medicine

## 2024-05-17 ENCOUNTER — Ambulatory Visit: Admitting: Internal Medicine

## 2024-05-17 VITALS — BP 114/68 | HR 60 | Resp 16 | Ht 61.0 in | Wt 165.0 lb

## 2024-05-17 DIAGNOSIS — I700 Atherosclerosis of aorta: Secondary | ICD-10-CM

## 2024-05-17 DIAGNOSIS — K5900 Constipation, unspecified: Secondary | ICD-10-CM

## 2024-05-17 DIAGNOSIS — I4891 Unspecified atrial fibrillation: Secondary | ICD-10-CM

## 2024-05-17 DIAGNOSIS — N019 Rapidly progressive nephritic syndrome with unspecified morphologic changes: Secondary | ICD-10-CM

## 2024-05-17 DIAGNOSIS — E7800 Pure hypercholesterolemia, unspecified: Secondary | ICD-10-CM | POA: Diagnosis not present

## 2024-05-17 DIAGNOSIS — D649 Anemia, unspecified: Secondary | ICD-10-CM

## 2024-05-17 DIAGNOSIS — I2510 Atherosclerotic heart disease of native coronary artery without angina pectoris: Secondary | ICD-10-CM

## 2024-05-17 DIAGNOSIS — I10 Essential (primary) hypertension: Secondary | ICD-10-CM | POA: Diagnosis not present

## 2024-05-17 LAB — LIPID PANEL
Cholesterol: 232 mg/dL — ABNORMAL HIGH (ref 0–200)
HDL: 39.6 mg/dL (ref >39.00)
LDL Cholesterol: 152 mg/dL — ABNORMAL HIGH (ref 0–99)
NonHDL: 192.28
Total CHOL/HDL Ratio: 6
Triglycerides: 200 mg/dL — ABNORMAL HIGH (ref 0.0–149.0)
VLDL: 40 mg/dL (ref 0.0–40.0)

## 2024-05-17 LAB — BASIC METABOLIC PANEL WITH GFR
BUN: 35 mg/dL — ABNORMAL HIGH (ref 6–23)
CO2: 23 meq/L (ref 19–32)
Calcium: 10 mg/dL (ref 8.4–10.5)
Chloride: 106 meq/L (ref 96–112)
Creatinine, Ser: 1.29 mg/dL — ABNORMAL HIGH (ref 0.40–1.20)
GFR: 42.42 mL/min — ABNORMAL LOW (ref >60.00)
Glucose, Bld: 95 mg/dL (ref 70–99)
Potassium: 4.7 meq/L (ref 3.5–5.1)
Sodium: 140 meq/L (ref 135–145)

## 2024-05-17 LAB — CBC WITH DIFFERENTIAL/PLATELET
Basophils Absolute: 0.1 K/uL (ref 0.0–0.1)
Basophils Relative: 0.7 % (ref 0.0–3.0)
Eosinophils Absolute: 0.4 K/uL (ref 0.0–0.7)
Eosinophils Relative: 4.3 % (ref 0.0–5.0)
HCT: 36.6 % (ref 36.0–46.0)
Hemoglobin: 11.9 g/dL — ABNORMAL LOW (ref 12.0–15.0)
Lymphocytes Relative: 35.4 % (ref 12.0–46.0)
Lymphs Abs: 3.1 K/uL (ref 0.7–4.0)
MCHC: 32.5 g/dL (ref 30.0–36.0)
MCV: 98.4 fl (ref 78.0–100.0)
Monocytes Absolute: 0.8 K/uL (ref 0.1–1.0)
Monocytes Relative: 9.1 % (ref 3.0–12.0)
Neutro Abs: 4.4 K/uL (ref 1.4–7.7)
Neutrophils Relative %: 50.5 % (ref 43.0–77.0)
Platelets: 311 K/uL (ref 150.0–400.0)
RBC: 3.72 Mil/uL — ABNORMAL LOW (ref 3.87–5.11)
RDW: 15.6 % — ABNORMAL HIGH (ref 11.5–15.5)
WBC: 8.7 K/uL (ref 4.0–10.5)

## 2024-05-17 LAB — HEPATIC FUNCTION PANEL
ALT: 15 U/L (ref 0–35)
AST: 13 U/L (ref 0–37)
Albumin: 3.7 g/dL (ref 3.5–5.2)
Alkaline Phosphatase: 76 U/L (ref 39–117)
Bilirubin, Direct: 0 mg/dL (ref 0.0–0.3)
Total Bilirubin: 0.4 mg/dL (ref 0.2–1.2)
Total Protein: 7.8 g/dL (ref 6.0–8.3)

## 2024-05-17 LAB — TSH: TSH: 2.99 u[IU]/mL (ref 0.35–5.50)

## 2024-05-17 MED ORDER — NYSTATIN 100000 UNIT/GM EX POWD: 1.0000 | Freq: Two times a day (BID) | CUTANEOUS | 0 refills | Status: AC

## 2024-05-17 MED ORDER — NYSTATIN 100000 UNIT/GM EX CREA
1.0000 | TOPICAL_CREAM | Freq: Two times a day (BID) | CUTANEOUS | 0 refills | Status: DC
Start: 2024-05-17 — End: 2024-05-28

## 2024-05-17 NOTE — Progress Notes (Signed)
 Subjective:    Patient ID: Leah Espinoza, female    DOB: Feb 18, 1956, 68 y.o.   MRN: [id number]  Patient here for  Chief Complaint  Patient presents with   Medical Management of Chronic Issues    HPI Here for a scheduled follow up -  follow up regarding CAD, afib, hypertension and hypercholesterolemia. She is accompanied by her husband. History obtained from both of them. 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 Last evaluated 11/08/23. Also followed by rheumatology - severe necrotizing myopathy.  saw cardiology 12/20/23 - stable. Recommended to continue current medications. Recommended f/u ECHO. ECHO 12/27/23 - EF 45%, G1DD, trivial AR, mild MR, trivial PR and trivial TR.  Main concern today is that of constipation. Her bowels will move some, but very small amounts. Husband had to recently disimpact. She is eating. No vomiting. Tried benefiber and stool softener. Discussed suppositories and enemas.    Past Medical History:  Diagnosis Date   Family history of adverse reaction to anesthesia    Sister - PONV   History of chicken pox    History of fainting    Hyperlipidemia    Hypertension    MI (myocardial infarction) (HCC)    Motion sickness    circular motion   Vertigo    no episodes for several months   Past Surgical History:  Procedure Laterality Date   CARDIAC CATHETERIZATION N/A 12/23/2015   Procedure: Left Heart Cath and Coronary Angiography;  Surgeon: Marsa Dooms, MD;  Location: [facility];  Service: Cardiovascular;  Laterality: N/A;   CARDIAC CATHETERIZATION N/A 12/23/2015   Procedure: Coronary Stent Intervention;  Surgeon: Marsa Dooms, MD;  Location: [facility];  Service: Cardiovascular;  Laterality: N/A;   CATARACT EXTRACTION W/PHACO Right 07/27/2017   Procedure: CATARACT EXTRACTION PHACO AND INTRAOCULAR LENS PLACEMENT (IOC) RIGHT SYMFONY LENS;  Surgeon: Mittie Gaskin, MD;  Location:  [facility];  Service: Ophthalmology;  Laterality: Right;   CATARACT EXTRACTION W/PHACO Left 08/10/2017   Procedure: CATARACT EXTRACTION PHACO AND INTRAOCULAR LENS PLACEMENT (IOC) LEFT SYMFONY TORIC LENS;  Surgeon: Mittie Gaskin, MD;  Location: [facility];  Service: Ophthalmology;  Laterality: Left;   COLONOSCOPY     MELANOMA EXCISION  05/13/2021   Family History  Problem Relation Age of Onset   Hyperlipidemia Mother    Heart disease Mother    Cancer Father        lung   Sudden death Brother    Arthritis Maternal Grandmother    Social History   Socioeconomic History   Marital status: Married    Spouse name: Not on file   Number of children: Not on file   Years of education: Not on file   Highest education level: Not on file  Occupational History   Not on file  Tobacco Use   Smoking status: Former   Smokeless tobacco: Never   Tobacco comments:    smoked for approx 1 yr around age 2  Vaping Use   Vaping status: Never Used  Substance and Sexual Activity   Alcohol use: No    Alcohol/week: 0.0 standard drinks of alcohol   Drug use: No   Sexual activity: Not on file  Other Topics Concern   Not on file  Social History Narrative   married   Social Drivers of Health   Financial Resource Strain: Low Risk  (01/16/2024)   Overall Financial Resource Strain (CARDIA)    Difficulty of Paying Living Expenses:  Not hard at all  Food Insecurity: No Food Insecurity (01/16/2024)   Hunger Vital Sign    Worried About Running Out of Food in the Last Year: Never true    Ran Out of Food in the Last Year: Never true  Transportation Needs: No Transportation Needs (01/16/2024)   PRAPARE - Administrator, Civil Service (Medical): No    Lack of Transportation (Non-Medical): No  Physical Activity: Inactive (01/16/2024)   Exercise Vital Sign    Days of Exercise per Week: 0 days    Minutes of Exercise per Session: 0 min  Stress: No Stress Concern Present (01/16/2024)    [employer] of Occupational Health - Occupational Stress Questionnaire    Feeling of Stress : Not at all  Social Connections: Moderately Isolated (01/16/2024)   Social Connection and Isolation Panel    Frequency of Communication with Friends and Family: More than three times a week    Frequency of Social Gatherings with Friends and Family: More than three times a week    Attends Religious Services: Never    Database administrator or Organizations: No    Attends Banker Meetings: Never    Marital Status: Married     Review of Systems  Constitutional:  Negative for appetite change and fever.  HENT:  Negative for congestion and sinus pressure.   Respiratory:  Negative for cough and chest tightness.        Breathing stable.   Cardiovascular:  Negative for chest pain and palpitations.  Gastrointestinal:  Positive for constipation. Negative for vomiting.  Genitourinary:  Negative for difficulty urinating and dysuria.  Musculoskeletal:  Negative for joint swelling and myalgias.  Skin:  Negative for color change and rash.  Neurological:  Negative for dizziness and headaches.  Psychiatric/Behavioral:  Negative for agitation and dysphoric mood.        Objective:     BP 114/68   Pulse 60   Resp 16   Ht 5' 1 (1.549 m)   Wt 165 lb (74.8 kg)   SpO2 98%   BMI 31.18 kg/m  Wt Readings from Last 3 Encounters:  05/17/24 165 lb (74.8 kg)  01/16/24 165 lb (74.8 kg)  11/14/23 165 lb (74.8 kg)    Physical Exam Vitals reviewed.  Constitutional:      General: She is not in acute distress.    Appearance: Normal appearance.  HENT:     Head: Normocephalic and atraumatic.     Right Ear: External ear normal.     Left Ear: External ear normal.  Eyes:     General: No scleral icterus.       Right eye: No discharge.        Left eye: No discharge.     Conjunctiva/sclera: Conjunctivae normal.  Neck:     Thyroid : No thyromegaly.  Cardiovascular:     Rate and Rhythm:  Normal rate and regular rhythm.  Pulmonary:     Effort: No respiratory distress.     Breath sounds: Normal breath sounds. No wheezing.  Abdominal:     General: Bowel sounds are normal.     Palpations: Abdomen is soft.     Tenderness: There is no abdominal tenderness.  Genitourinary:    Comments: Declined rectal exam - to check for impaction  Musculoskeletal:        General: No tenderness.     Cervical back: Neck supple. No tenderness.     Comments: No increased swelling.   Lymphadenopathy:  Cervical: No cervical adenopathy.  Skin:    Findings: No erythema or rash.  Neurological:     Mental Status: She is alert.  Psychiatric:        Mood and Affect: Mood normal.        Behavior: Behavior normal.         Outpatient Encounter Medications as of 05/17/2024  Medication Sig   nystatin  (MYCOSTATIN /NYSTOP ) powder Apply 1 Application topically 2 (two) times daily.   nystatin  cream (MYCOSTATIN ) Apply 1 Application topically 2 (two) times daily.   apixaban  (ELIQUIS ) 2.5 MG TABS tablet Take 1 tablet (2.5 mg total) by mouth 2 (two) times daily.   aspirin  81 MG EC tablet Take by mouth.   Cholecalciferol  (VITAMIN D -1000 MAX ST) 25 MCG (1000 UT) tablet Take 1,000 Units by mouth daily.    irbesartan (AVAPRO) 75 MG tablet Take 75 mg by mouth daily.   Melatonin 5 MG TBDP Take 5 mg by mouth at bedtime.   metoprolol  tartrate (LOPRESSOR ) 25 MG tablet Take 0.5 tablets (12.5 mg total) by mouth 2 (two) times daily.   mirtazapine  (REMERON ) 30 MG tablet TAKE 1 TABLET BY MOUTH AT BEDTIME (Patient not taking: Reported on 01/16/2024)   pantoprazole  (PROTONIX ) 40 MG tablet Take 40 mg by mouth daily.   REPATHA  SURECLICK 140 MG/ML SOAJ INJECT 140 MG INTO THE SKIN EVERY 14 DAYS   vitamin C (ASCORBIC ACID) 250 MG tablet Take 250 mg by mouth daily.   [DISCONTINUED] silver  sulfADIAZINE  (SILVADENE ) 1 % cream APPLY TOPICALLY TO AFFECTED AREAS DAILY (Patient not taking: Reported on 01/16/2024)   No  facility-administered encounter medications on file as of 05/17/2024.     Lab Results  Component Value Date   WBC 8.7 05/17/2024   HGB 11.9 (L) 05/17/2024   HCT 36.6 05/17/2024   PLT 311.0 05/17/2024   GLUCOSE 95 05/17/2024   CHOL 232 (H) 05/17/2024   TRIG 200.0 (H) 05/17/2024   HDL 39.60 05/17/2024   LDLCALC 152 (H) 05/17/2024   ALT 15 05/17/2024   AST 13 05/17/2024   NA 140 05/17/2024   K 4.7 05/17/2024   CL 106 05/17/2024   CREATININE 1.29 (H) 05/17/2024   BUN 35 (H) 05/17/2024   CO2 23 05/17/2024   TSH 2.99 05/17/2024   INR 0.91 08/28/2018       Assessment & Plan:  Hypercholesterolemia Assessment & Plan: Unable to take statin medication.  Is currently on Repatha . Low cholesterol diet. Check lipid panel today.   Orders: -     CBC with Differential/Platelet -     Hepatic function panel -     Lipid panel  Pauci-immune RPGN (rapidly progressive glomerulonephritis) Assessment & Plan: Followed by nephrology.  Off CellCept as outlined.  Continue irbesartan.    Essential (primary) hypertension Assessment & Plan: Continue metoprolol  and irbesartan.  Follow pressures. Follow metabolic panel.   Orders: -     Basic metabolic panel with GFR  Aortic atherosclerosis (HCC) Assessment & Plan: Continue repatha .    Atrial fibrillation, unspecified type [facility]) Assessment & Plan: Continue eliquis  and metoprolol . Stable.   Orders: -     TSH  Constipation, unspecified constipation type Assessment & Plan: Persistent issues with constipation as outlined. Stay hydrated. Has taken benefiber. Also discussed stool softener. Given increased time since she has had a good bowel movement, wanted to check for impaction. She declines. Will have her use a suppository or enema. If no results repeat. Once move, start miralax  daily. Monitor fluid intake. Call  with update. If she goes more than a few days without a bowel movement, then use suppository or enema.    Anemia, unspecified  type Assessment & Plan: Has been evaluated by Dr Rennie. Follow cbc.    3-vessel CAD Assessment & Plan: Has been followed by cardiology. On repatha . Continue risk factor modification.    Other orders -     Nystatin ; Apply 1 Application topically 2 (two) times daily.  Dispense: 30 g; Refill: 0 -     Nystatin ; Apply 1 Application topically 2 (two) times daily.  Dispense: 30 g; Refill: 0     Allena Hamilton, MD

## 2024-05-18 ENCOUNTER — Ambulatory Visit: Payer: Self-pay | Admitting: Internal Medicine

## 2024-05-24 ENCOUNTER — Other Ambulatory Visit: Payer: Self-pay | Admitting: Internal Medicine

## 2024-05-25 ENCOUNTER — Other Ambulatory Visit: Payer: Self-pay | Admitting: Internal Medicine

## 2024-05-27 ENCOUNTER — Encounter: Payer: Self-pay | Admitting: Internal Medicine

## 2024-05-27 NOTE — Assessment & Plan Note (Signed)
 Continue eliquis  and metoprolol . Stable.

## 2024-05-27 NOTE — Assessment & Plan Note (Signed)
Continue repatha.

## 2024-05-27 NOTE — Assessment & Plan Note (Signed)
Continue metoprolol and irbesartan. Follow pressures. Follow metabolic panel.

## 2024-05-27 NOTE — Assessment & Plan Note (Signed)
 Followed by nephrology.  Off CellCept as outlined.  Continue irbesartan.

## 2024-05-27 NOTE — Assessment & Plan Note (Signed)
 Persistent issues with constipation as outlined. Stay hydrated. Has taken benefiber. Also discussed stool softener. Given increased time since she has had a good bowel movement, wanted to check for impaction. She declines. Will have her use a suppository or enema. If no results repeat. Once move, start miralax  daily. Monitor fluid intake. Call with update. If she goes more than a few days without a bowel movement, then use suppository or enema.

## 2024-05-27 NOTE — Assessment & Plan Note (Signed)
 Has been followed by cardiology. On repatha. Continue risk factor modification.

## 2024-05-27 NOTE — Assessment & Plan Note (Signed)
 Unable to take statin medication.  Is currently on Repatha . Low cholesterol diet. Check lipid panel today.

## 2024-05-27 NOTE — Assessment & Plan Note (Signed)
 Has been evaluated by Dr Rennie. Follow cbc.

## 2024-05-28 ENCOUNTER — Other Ambulatory Visit: Payer: Self-pay | Admitting: Internal Medicine

## 2024-06-18 ENCOUNTER — Telehealth: Payer: Self-pay

## 2024-06-18 DIAGNOSIS — Z7982 Long term (current) use of aspirin: Secondary | ICD-10-CM | POA: Diagnosis not present

## 2024-06-18 DIAGNOSIS — G729 Myopathy, unspecified: Secondary | ICD-10-CM | POA: Diagnosis not present

## 2024-06-18 DIAGNOSIS — Z8249 Family history of ischemic heart disease and other diseases of the circulatory system: Secondary | ICD-10-CM | POA: Diagnosis not present

## 2024-06-18 DIAGNOSIS — C4362 Malignant melanoma of left upper limb, including shoulder: Secondary | ICD-10-CM | POA: Diagnosis not present

## 2024-06-18 DIAGNOSIS — I129 Hypertensive chronic kidney disease with stage 1 through stage 4 chronic kidney disease, or unspecified chronic kidney disease: Secondary | ICD-10-CM | POA: Diagnosis not present

## 2024-06-18 DIAGNOSIS — N189 Chronic kidney disease, unspecified: Secondary | ICD-10-CM | POA: Diagnosis not present

## 2024-06-18 DIAGNOSIS — Z7901 Long term (current) use of anticoagulants: Secondary | ICD-10-CM | POA: Diagnosis not present

## 2024-06-18 DIAGNOSIS — N059 Unspecified nephritic syndrome with unspecified morphologic changes: Secondary | ICD-10-CM | POA: Diagnosis not present

## 2024-06-18 DIAGNOSIS — R531 Weakness: Secondary | ICD-10-CM | POA: Diagnosis not present

## 2024-06-18 NOTE — Telephone Encounter (Signed)
 Copied from CRM [id number]. Topic: Clinical - Prescription Issue >> Jun 18, 2024  4:52 PM Alfonso HERO wrote: Reason for CRM: Pharmacy is asking about a response for the pantoprazole  request sent over. Because they haven't heard anything back. If its denied please call them to info at [phone].

## 2024-06-19 ENCOUNTER — Other Ambulatory Visit: Payer: Self-pay

## 2024-06-19 MED ORDER — PANTOPRAZOLE SODIUM 40 MG PO TBEC: 40.0000 mg | DELAYED_RELEASE_TABLET | Freq: Every day | ORAL | 1 refills | Status: AC

## 2024-06-19 NOTE — Telephone Encounter (Signed)
 Rx sent in

## 2024-06-19 NOTE — Telephone Encounter (Signed)
 Ok to refill pantoprazole  if taking and doing well.

## 2024-06-19 NOTE — Telephone Encounter (Signed)
 Called patient to confirm taking. Patient stated that she is taking the 40 mg daily and has been taking. Tolerating well. OK to send in for her? For some reason it is showing historical in her chart.

## 2024-06-20 DIAGNOSIS — R0609 Other forms of dyspnea: Secondary | ICD-10-CM | POA: Diagnosis not present

## 2024-06-20 DIAGNOSIS — I10 Essential (primary) hypertension: Secondary | ICD-10-CM | POA: Diagnosis not present

## 2024-06-20 DIAGNOSIS — I480 Paroxysmal atrial fibrillation: Secondary | ICD-10-CM | POA: Diagnosis not present

## 2024-06-20 DIAGNOSIS — I255 Ischemic cardiomyopathy: Secondary | ICD-10-CM | POA: Diagnosis not present

## 2024-06-20 DIAGNOSIS — I2510 Atherosclerotic heart disease of native coronary artery without angina pectoris: Secondary | ICD-10-CM | POA: Diagnosis not present

## 2024-06-20 DIAGNOSIS — E7800 Pure hypercholesterolemia, unspecified: Secondary | ICD-10-CM | POA: Diagnosis not present

## 2024-06-20 DIAGNOSIS — I2109 ST elevation (STEMI) myocardial infarction involving other coronary artery of anterior wall: Secondary | ICD-10-CM | POA: Diagnosis not present

## 2024-07-06 ENCOUNTER — Telehealth: Payer: Self-pay

## 2024-07-06 NOTE — Telephone Encounter (Signed)
 Copied from CRM [id number]. Topic: General - Other >> Jul 06, 2024 11:46 AM Robinson H wrote: Reason for CRM: Records show patient has diabetes but not taking a statin medication. Recommends to take a statin reduce cardiovascular issues and stroke.   [employer] [phone]

## 2024-07-07 NOTE — Telephone Encounter (Signed)
 It is documented in her chart that she has a history of necrotizing myopathy, etc and is unable to take statin medication. She is currently on repatha  to treat her cholesterol. Let me know if I need to document something more.

## 2024-07-09 NOTE — Telephone Encounter (Signed)
 Noted

## 2024-08-28 DIAGNOSIS — Z7982 Long term (current) use of aspirin: Secondary | ICD-10-CM | POA: Diagnosis not present

## 2024-08-28 DIAGNOSIS — Z79899 Other long term (current) drug therapy: Secondary | ICD-10-CM | POA: Diagnosis not present

## 2024-08-28 DIAGNOSIS — I4891 Unspecified atrial fibrillation: Secondary | ICD-10-CM | POA: Diagnosis not present

## 2024-08-28 DIAGNOSIS — I2510 Atherosclerotic heart disease of native coronary artery without angina pectoris: Secondary | ICD-10-CM | POA: Diagnosis not present

## 2024-08-28 DIAGNOSIS — M3329 Polymyositis with other organ involvement: Secondary | ICD-10-CM | POA: Diagnosis not present

## 2024-08-28 DIAGNOSIS — I129 Hypertensive chronic kidney disease with stage 1 through stage 4 chronic kidney disease, or unspecified chronic kidney disease: Secondary | ICD-10-CM | POA: Diagnosis not present

## 2024-08-28 DIAGNOSIS — E785 Hyperlipidemia, unspecified: Secondary | ICD-10-CM | POA: Diagnosis not present

## 2024-08-28 DIAGNOSIS — Z7901 Long term (current) use of anticoagulants: Secondary | ICD-10-CM | POA: Diagnosis not present

## 2024-08-28 DIAGNOSIS — R7989 Other specified abnormal findings of blood chemistry: Secondary | ICD-10-CM | POA: Diagnosis not present

## 2024-08-28 DIAGNOSIS — N189 Chronic kidney disease, unspecified: Secondary | ICD-10-CM | POA: Diagnosis not present

## 2024-08-28 DIAGNOSIS — Z888 Allergy status to other drugs, medicaments and biological substances status: Secondary | ICD-10-CM | POA: Diagnosis not present

## 2024-09-12 ENCOUNTER — Other Ambulatory Visit: Payer: Self-pay | Admitting: Internal Medicine

## 2024-09-14 NOTE — Telephone Encounter (Incomplete)
 CMA added order to visit on 09/18/24.  ALT

## 2024-09-17 ENCOUNTER — Other Ambulatory Visit: Payer: Self-pay | Admitting: Internal Medicine

## 2024-09-18 ENCOUNTER — Encounter: Payer: Self-pay | Admitting: Internal Medicine

## 2024-09-18 ENCOUNTER — Ambulatory Visit: Admitting: Internal Medicine

## 2024-09-18 VITALS — BP 128/76 | HR 55 | Temp 98.0°F

## 2024-09-18 DIAGNOSIS — I4891 Unspecified atrial fibrillation: Secondary | ICD-10-CM

## 2024-09-18 DIAGNOSIS — I2510 Atherosclerotic heart disease of native coronary artery without angina pectoris: Secondary | ICD-10-CM

## 2024-09-18 DIAGNOSIS — N019 Rapidly progressive nephritic syndrome with unspecified morphologic changes: Secondary | ICD-10-CM | POA: Diagnosis not present

## 2024-09-18 DIAGNOSIS — Z83719 Family history of colon polyps, unspecified: Secondary | ICD-10-CM | POA: Diagnosis not present

## 2024-09-18 DIAGNOSIS — N898 Other specified noninflammatory disorders of vagina: Secondary | ICD-10-CM | POA: Diagnosis not present

## 2024-09-18 DIAGNOSIS — Z23 Encounter for immunization: Secondary | ICD-10-CM

## 2024-09-18 DIAGNOSIS — I10 Essential (primary) hypertension: Secondary | ICD-10-CM | POA: Diagnosis not present

## 2024-09-18 DIAGNOSIS — E7800 Pure hypercholesterolemia, unspecified: Secondary | ICD-10-CM

## 2024-09-18 DIAGNOSIS — K5900 Constipation, unspecified: Secondary | ICD-10-CM

## 2024-09-18 DIAGNOSIS — M79601 Pain in right arm: Secondary | ICD-10-CM | POA: Diagnosis not present

## 2024-09-18 DIAGNOSIS — G7289 Other specified myopathies: Secondary | ICD-10-CM | POA: Diagnosis not present

## 2024-09-18 DIAGNOSIS — D649 Anemia, unspecified: Secondary | ICD-10-CM

## 2024-09-18 MED ORDER — NYSTATIN 100000 UNIT/GM EX CREA: TOPICAL_CREAM | Freq: Two times a day (BID) | CUTANEOUS | 0 refills | Status: AC

## 2024-09-18 MED ORDER — REPATHA SURECLICK 140 MG/ML ~~LOC~~ SOAJ: 140.0000 mg | SUBCUTANEOUS | 2 refills | Status: AC

## 2024-09-18 NOTE — Progress Notes (Signed)
 "  Subjective:    Patient ID: Leah Espinoza, female    DOB: November 19, 1955, 69 y.o.   MRN: [id number]  Patient here for  Chief Complaint  Patient presents with   Medical Management of Chronic Issues    HPI Here for a scheduled follow up - follow up regarding CAD, HTN and hypercholesterolemia. Off cellcept. She is accompanied by her husband. History obtained from both of them. Had f/u with nephrology 08/28/24 - slightly elevated creatinine. Recommended to stay hydrated. Continue irbesartan. Monitor heart rate. Had f/u with cardiology 06/20/24 - stable. She reports that her bowels are doing some better. Taking benefiber and stool softener. Discussed taking miralax  regularly. Use enema or suppository prn if needed. Denies any chest pain. She did report having right arm pain - yesterday. Described as aching. Lasted approximately 1.5 hours. She moved her arm and it popped. Resolved. Has not had any other problems. She is limited with rom of both arms. Discussed PT. She wanted to hold. Discussed further cardiac testing - wanted to hold. Reports some vaginal itching. Husband cleans her. Notices some blood when wiping. Offered exam. She declines. Declines gyn referral for further w/up.    Past Medical History:  Diagnosis Date   Family history of adverse reaction to anesthesia    Sister - PONV   History of chicken pox    History of fainting    Hyperlipidemia    Hypertension    MI (myocardial infarction) (HCC)    Motion sickness    circular motion   Vertigo    no episodes for several months   Past Surgical History:  Procedure Laterality Date   CARDIAC CATHETERIZATION N/A 12/23/2015   Procedure: Left Heart Cath and Coronary Angiography;  Surgeon: Marsa Dooms, MD;  Location: [facility];  Service: Cardiovascular;  Laterality: N/A;   CARDIAC CATHETERIZATION N/A 12/23/2015   Procedure: Coronary Stent Intervention;  Surgeon: Marsa Dooms, MD;  Location: [facility];   Service: Cardiovascular;  Laterality: N/A;   CATARACT EXTRACTION W/PHACO Right 07/27/2017   Procedure: CATARACT EXTRACTION PHACO AND INTRAOCULAR LENS PLACEMENT (IOC) RIGHT SYMFONY LENS;  Surgeon: Mittie Gaskin, MD;  Location: [facility];  Service: Ophthalmology;  Laterality: Right;   CATARACT EXTRACTION W/PHACO Left 08/10/2017   Procedure: CATARACT EXTRACTION PHACO AND INTRAOCULAR LENS PLACEMENT (IOC) LEFT SYMFONY TORIC LENS;  Surgeon: Mittie Gaskin, MD;  Location: [facility];  Service: Ophthalmology;  Laterality: Left;   COLONOSCOPY     MELANOMA EXCISION  05/13/2021   Family History  Problem Relation Age of Onset   Hyperlipidemia Mother    Heart disease Mother    Cancer Father        lung   Sudden death Brother    Arthritis Maternal Grandmother    Social History   Socioeconomic History   Marital status: Married    Spouse name: Not on file   Number of children: Not on file   Years of education: Not on file   Highest education level: Not on file  Occupational History   Not on file  Tobacco Use   Smoking status: Former   Smokeless tobacco: Never   Tobacco comments:    smoked for approx 1 yr around age 20  Vaping Use   Vaping status: Never Used  Substance and Sexual Activity   Alcohol use: No    Alcohol/week: 0.0 standard drinks of alcohol   Drug use: No   Sexual activity: Not on file  Other Topics Concern  Not on file  Social History Narrative   married   Social Drivers of Health   Tobacco Use: Medium Risk (09/23/2024)   Patient History    Smoking Tobacco Use: Former    Smokeless Tobacco Use: Never    Passive Exposure: Not on file  Financial Resource Strain: Low Risk (01/16/2024)   Overall Financial Resource Strain (CARDIA)    Difficulty of Paying Living Expenses: Not hard at all  Food Insecurity: No Food Insecurity (01/16/2024)   Hunger Vital Sign    Worried About Running Out of Food in the Last Year: Never true    Ran Out of Food in  the Last Year: Never true  Transportation Needs: No Transportation Needs (01/16/2024)   PRAPARE - Administrator, Civil Service (Medical): No    Lack of Transportation (Non-Medical): No  Physical Activity: Inactive (01/16/2024)   Exercise Vital Sign    Days of Exercise per Week: 0 days    Minutes of Exercise per Session: 0 min  Stress: No Stress Concern Present (01/16/2024)   [employer] of Occupational Health - Occupational Stress Questionnaire    Feeling of Stress : Not at all  Social Connections: Moderately Isolated (01/16/2024)   Social Connection and Isolation Panel    Frequency of Communication with Friends and Family: More than three times a week    Frequency of Social Gatherings with Friends and Family: More than three times a week    Attends Religious Services: Never    Database Administrator or Organizations: No    Attends Banker Meetings: Never    Marital Status: Married  Depression (PHQ2-9): Low Risk (01/16/2024)   Depression (PHQ2-9)    PHQ-2 Score: 2  Alcohol Screen: Low Risk (01/16/2024)   Alcohol Screen    Last Alcohol Screening Score (AUDIT): 0  Housing: Unknown (06/20/2024)   Received from [facility]   Epic    Unable to Pay for Housing in the Last Year: Not on file    Number of Times Moved in the Last Year: Not on file    At any time in the past 12 months, were you homeless or living in a shelter (including now)?: Patient unable to answer  Utilities: Not At Risk (01/16/2024)   [facility] Utilities    Threatened with loss of utilities: No  Health Literacy: Adequate Health Literacy (01/16/2024)   B1300 Health Literacy    Frequency of need for help with medical instructions: Never     Review of Systems  Constitutional:  Negative for appetite change and unexpected weight change.  HENT:  Negative for congestion and sinus pressure.   Respiratory:  Negative for cough, chest tightness and shortness of breath.   Cardiovascular:  Negative  for chest pain and palpitations.       No increased swelling.   Gastrointestinal:  Positive for constipation. Negative for abdominal pain, diarrhea, nausea and vomiting.  Genitourinary:  Negative for pelvic pain.       Vaginal itching as outlined.   Musculoskeletal:  Negative for myalgias.  Skin:  Negative for color change and rash.  Neurological:  Negative for dizziness and headaches.  Psychiatric/Behavioral:  Negative for agitation and dysphoric mood.        Objective:     BP 128/76   Pulse (!) 55   Temp 98 F (36.7 C) (Oral)   SpO2 96%  Wt Readings from Last 3 Encounters:  05/17/24 165 lb (74.8 kg)  01/16/24 165 lb (  74.8 kg)  11/14/23 165 lb (74.8 kg)    Physical Exam Vitals reviewed.  Constitutional:      General: She is not in acute distress.    Appearance: Normal appearance.  HENT:     Head: Normocephalic and atraumatic.     Right Ear: External ear normal.     Left Ear: External ear normal.  Eyes:     General: No scleral icterus.       Right eye: No discharge.        Left eye: No discharge.     Conjunctiva/sclera: Conjunctivae normal.  Neck:     Thyroid : No thyromegaly.  Cardiovascular:     Rate and Rhythm: Normal rate and regular rhythm.  Pulmonary:     Effort: No respiratory distress.     Breath sounds: Normal breath sounds. No wheezing.  Abdominal:     General: Bowel sounds are normal.     Palpations: Abdomen is soft.     Tenderness: There is no abdominal tenderness.  Musculoskeletal:        General: No tenderness.     Cervical back: Neck supple. No tenderness.     Comments: No increased swelling - stable. Limited rom - bilateral arms.   Lymphadenopathy:     Cervical: No cervical adenopathy.  Skin:    Findings: No erythema or rash.  Neurological:     Mental Status: She is alert.  Psychiatric:        Mood and Affect: Mood normal.        Behavior: Behavior normal.         Outpatient Encounter Medications as of 09/18/2024  Medication Sig    apixaban  (ELIQUIS ) 2.5 MG TABS tablet Take 1 tablet (2.5 mg total) by mouth 2 (two) times daily.   aspirin  81 MG EC tablet Take by mouth.   Cholecalciferol  (VITAMIN D -1000 MAX ST) 25 MCG (1000 UT) tablet Take 1,000 Units by mouth daily.    irbesartan (AVAPRO) 75 MG tablet Take 75 mg by mouth daily.   metoprolol  tartrate (LOPRESSOR ) 25 MG tablet Take 0.5 tablets (12.5 mg total) by mouth 2 (two) times daily.   nystatin  (MYCOSTATIN /NYSTOP ) powder Apply 1 Application topically 2 (two) times daily.   pantoprazole  (PROTONIX ) 40 MG tablet Take 1 tablet (40 mg total) by mouth daily.   vitamin C (ASCORBIC ACID) 250 MG tablet Take 250 mg by mouth daily.   [DISCONTINUED] nystatin  cream (MYCOSTATIN ) APPLY TOPICALLY TO AFFECTED AREAS 2 TIMES DAILY   Evolocumab  (REPATHA  SURECLICK) 140 MG/ML SOAJ Inject 140 mg into the skin every 14 (fourteen) days.   nystatin  cream (MYCOSTATIN ) Apply topically 2 (two) times daily.   [DISCONTINUED] Melatonin 5 MG TBDP Take 5 mg by mouth at bedtime. (Patient not taking: Reported on 09/18/2024)   [DISCONTINUED] mirtazapine  (REMERON ) 30 MG tablet TAKE 1 TABLET BY MOUTH AT BEDTIME (Patient not taking: Reported on 09/18/2024)   [DISCONTINUED] REPATHA  SURECLICK 140 MG/ML SOAJ INJECT 140 MG INTO THE SKIN EVERY 14 DAYS   No facility-administered encounter medications on file as of 09/18/2024.     Lab Results  Component Value Date   WBC 8.7 05/17/2024   HGB 11.9 (L) 05/17/2024   HCT 36.6 05/17/2024   PLT 311.0 05/17/2024   GLUCOSE 95 05/17/2024   CHOL 232 (H) 05/17/2024   TRIG 200.0 (H) 05/17/2024   HDL 39.60 05/17/2024   LDLCALC 152 (H) 05/17/2024   ALT 15 05/17/2024   AST 13 05/17/2024   NA 140 05/17/2024   K  4.7 05/17/2024   CL 106 05/17/2024   CREATININE 1.29 (H) 05/17/2024   BUN 35 (H) 05/17/2024   CO2 23 05/17/2024   TSH 2.99 05/17/2024   INR 0.91 08/28/2018       Assessment & Plan:  Need for influenza vaccination -     Flu vaccine HIGH DOSE PF(Fluzone  Trivalent)  Atrial fibrillation, unspecified type (HCC) Assessment & Plan: Continue eliquis  and metoprolol . Stable.    Hypercholesterolemia Assessment & Plan: Unable to take statin medication.  Is currently on Repatha . Low cholesterol diet. Follow lipid panel.    Essential (primary) hypertension Assessment & Plan: Continue metoprolol  and irbesartan.  Follow pressures. Follow metabolic panel.    Pauci-immune RPGN (rapidly progressive glomerulonephritis) Assessment & Plan: Followed by nephrology.  Off CellCept as outlined.  Continue irbesartan. CR increased some. Nephrology following. Stay hydrated.    Normocytic anemia Assessment & Plan: Hgb checked through nephrology - 12.4.    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  Stable.  In powered wheelchair.  Unable to walk.  Off CellCept.   Family history of colonic polyps Assessment & Plan: Colonoscopy 12/2013.  F/u in 10 years. Due f/u colonoscopy.    Constipation, unspecified constipation type Assessment & Plan: Discussed using miralax  regularly. Discussed adjusting dose. Follow.    3-vessel CAD Assessment & Plan: Has been followed by cardiology. On repatha . Continue f/u with cardiology. Continue risk factor modification.    Pain of right upper extremity Assessment & Plan: Arm pain as outlined. Limited rom. No pain now. Declines further w/up. Discussed PT. Wants to hold. Follow.    Vaginal itching Assessment & Plan: Nystatin  cream. Husband reported - vaginal bleeding. Discussed exam and further w/up. Declines. Wants to monitor.    Other orders -     Repatha  SureClick; Inject 140 mg into the skin every 14 (fourteen) days.  Dispense: 2 mL; Refill: 2 -     Nystatin ; Apply topically 2 (two) times daily.  Dispense: 60 g; Refill: 0     Allena Hamilton, MD "

## 2024-09-23 ENCOUNTER — Encounter: Payer: Self-pay | Admitting: Internal Medicine

## 2024-09-23 DIAGNOSIS — M79603 Pain in arm, unspecified: Secondary | ICD-10-CM | POA: Insufficient documentation

## 2024-09-23 DIAGNOSIS — N898 Other specified noninflammatory disorders of vagina: Secondary | ICD-10-CM | POA: Insufficient documentation

## 2024-09-23 NOTE — Assessment & Plan Note (Signed)
 Unable to take statin medication.  Is currently on Repatha . Low cholesterol diet. Follow lipid panel.

## 2024-09-23 NOTE — Assessment & Plan Note (Signed)
 Colonoscopy 12/2013.  F/u in 10 years. Due f/u colonoscopy.

## 2024-09-23 NOTE — Assessment & Plan Note (Signed)
 Previously treated with IVIG.  Stable.  In powered wheelchair.  Unable to walk.  Off CellCept.

## 2024-09-23 NOTE — Assessment & Plan Note (Signed)
 Continue eliquis  and metoprolol . Stable.

## 2024-09-23 NOTE — Assessment & Plan Note (Signed)
 Arm pain as outlined. Limited rom. No pain now. Declines further w/up. Discussed PT. Wants to hold. Follow.

## 2024-09-23 NOTE — Assessment & Plan Note (Signed)
Continue metoprolol and irbesartan. Follow pressures. Follow metabolic panel.

## 2024-09-23 NOTE — Assessment & Plan Note (Signed)
 Nystatin  cream. Husband reported - vaginal bleeding. Discussed exam and further w/up. Declines. Wants to monitor.

## 2024-09-23 NOTE — Assessment & Plan Note (Signed)
 Discussed using miralax  regularly. Discussed adjusting dose. Follow.

## 2024-09-23 NOTE — Assessment & Plan Note (Addendum)
 Followed by nephrology.  Off CellCept as outlined.  Continue irbesartan. CR increased some. Nephrology following. Stay hydrated.

## 2024-09-23 NOTE — Assessment & Plan Note (Signed)
 Hgb checked through nephrology - 12.4.

## 2024-09-23 NOTE — Assessment & Plan Note (Signed)
 Has been followed by cardiology. On repatha . Continue f/u with cardiology. Continue risk factor modification.

## 2024-10-03 NOTE — Progress Notes (Signed)
 Leah Espinoza                                          MRN: [id number]   10/03/2024   The [facility] Quality Team Specialist reviewed this patient medical record for the purposes of chart review for care gap closure. The following were reviewed: chart review for care gap closure-breast cancer screening and colorectal cancer screening.    [facility] Quality Team

## 2025-01-16 ENCOUNTER — Ambulatory Visit

## 2025-01-17 ENCOUNTER — Ambulatory Visit: Admitting: Internal Medicine
